# Patient Record
Sex: Male | Born: 1955 | Race: White | Hispanic: No | Marital: Married | Smoking: Former smoker
Health system: Southern US, Community
[De-identification: ages and names within clinical notes are randomized; demographics above are authoritative.]

## PROBLEM LIST (undated history)

## (undated) DIAGNOSIS — F319 Bipolar disorder, unspecified: Secondary | ICD-10-CM

## (undated) DIAGNOSIS — R0600 Dyspnea, unspecified: Secondary | ICD-10-CM

## (undated) DIAGNOSIS — M545 Low back pain, unspecified: Secondary | ICD-10-CM

## (undated) DIAGNOSIS — K5792 Diverticulitis of intestine, part unspecified, without perforation or abscess without bleeding: Secondary | ICD-10-CM

## (undated) DIAGNOSIS — I2699 Other pulmonary embolism without acute cor pulmonale: Secondary | ICD-10-CM

## (undated) DIAGNOSIS — E785 Hyperlipidemia, unspecified: Secondary | ICD-10-CM

## (undated) DIAGNOSIS — R609 Edema, unspecified: Secondary | ICD-10-CM

## (undated) DIAGNOSIS — N200 Calculus of kidney: Secondary | ICD-10-CM

## (undated) DIAGNOSIS — F329 Major depressive disorder, single episode, unspecified: Secondary | ICD-10-CM

## (undated) DIAGNOSIS — I442 Atrioventricular block, complete: Secondary | ICD-10-CM

## (undated) DIAGNOSIS — N3000 Acute cystitis without hematuria: Secondary | ICD-10-CM

## (undated) DIAGNOSIS — N400 Enlarged prostate without lower urinary tract symptoms: Secondary | ICD-10-CM

## (undated) DIAGNOSIS — F32A Depression, unspecified: Secondary | ICD-10-CM

## (undated) DIAGNOSIS — F419 Anxiety disorder, unspecified: Secondary | ICD-10-CM

## (undated) HISTORY — DX: Acute cystitis without hematuria: N30.00

## (undated) HISTORY — DX: Hyperlipidemia, unspecified: E78.5

## (undated) HISTORY — DX: Low back pain: M54.5

## (undated) HISTORY — DX: Dyspnea, unspecified: R06.00

## (undated) HISTORY — DX: Diverticulitis of intestine, part unspecified, without perforation or abscess without bleeding: K57.92

## (undated) HISTORY — DX: Low back pain, unspecified: M54.50

## (undated) HISTORY — DX: Edema, unspecified: R60.9

## (undated) HISTORY — PX: TONSILLECTOMY: SUR1361

## (undated) HISTORY — PX: KNEE ARTHROSCOPY: SUR90

## (undated) HISTORY — DX: Other pulmonary embolism without acute cor pulmonale: I26.99

## (undated) HISTORY — DX: Calculus of kidney: N20.0

## (undated) HISTORY — DX: Benign prostatic hyperplasia without lower urinary tract symptoms: N40.0

## (undated) HISTORY — DX: Atrioventricular block, complete: I44.2

## (undated) HISTORY — PX: OTHER SURGICAL HISTORY: SHX169

---

## 2002-09-02 HISTORY — PX: FINGER FRACTURE SURGERY: SHX638

## 2003-10-05 ENCOUNTER — Encounter: Admission: RE | Admit: 2003-10-05 | Discharge: 2003-10-05 | Payer: Self-pay | Admitting: Family Medicine

## 2003-10-05 ENCOUNTER — Inpatient Hospital Stay (HOSPITAL_COMMUNITY): Admission: EM | Admit: 2003-10-05 | Discharge: 2003-10-07 | Payer: Self-pay

## 2008-01-25 ENCOUNTER — Emergency Department (HOSPITAL_COMMUNITY): Admission: EM | Admit: 2008-01-25 | Discharge: 2008-01-25 | Payer: Self-pay | Admitting: Emergency Medicine

## 2009-10-03 DIAGNOSIS — I2699 Other pulmonary embolism without acute cor pulmonale: Secondary | ICD-10-CM

## 2009-10-03 HISTORY — DX: Other pulmonary embolism without acute cor pulmonale: I26.99

## 2009-10-22 ENCOUNTER — Encounter: Payer: Self-pay | Admitting: Emergency Medicine

## 2009-10-22 ENCOUNTER — Inpatient Hospital Stay (HOSPITAL_COMMUNITY): Admission: EM | Admit: 2009-10-22 | Discharge: 2009-10-24 | Payer: Self-pay | Admitting: Internal Medicine

## 2009-10-22 ENCOUNTER — Ambulatory Visit: Payer: Self-pay | Admitting: Interventional Radiology

## 2009-10-23 ENCOUNTER — Encounter (INDEPENDENT_AMBULATORY_CARE_PROVIDER_SITE_OTHER): Payer: Self-pay | Admitting: Internal Medicine

## 2009-10-31 ENCOUNTER — Encounter (INDEPENDENT_AMBULATORY_CARE_PROVIDER_SITE_OTHER): Payer: Self-pay | Admitting: Family Medicine

## 2009-10-31 ENCOUNTER — Ambulatory Visit: Payer: Self-pay | Admitting: Surgery

## 2009-10-31 ENCOUNTER — Ambulatory Visit: Admission: RE | Admit: 2009-10-31 | Discharge: 2009-10-31 | Payer: Self-pay | Admitting: Family Medicine

## 2009-11-01 DIAGNOSIS — Z87898 Personal history of other specified conditions: Secondary | ICD-10-CM | POA: Insufficient documentation

## 2009-11-01 DIAGNOSIS — Z86711 Personal history of pulmonary embolism: Secondary | ICD-10-CM | POA: Insufficient documentation

## 2009-11-01 DIAGNOSIS — E785 Hyperlipidemia, unspecified: Secondary | ICD-10-CM | POA: Insufficient documentation

## 2009-11-02 ENCOUNTER — Ambulatory Visit: Payer: Self-pay | Admitting: Internal Medicine

## 2009-11-02 DIAGNOSIS — R1031 Right lower quadrant pain: Secondary | ICD-10-CM | POA: Insufficient documentation

## 2009-11-09 ENCOUNTER — Emergency Department (HOSPITAL_BASED_OUTPATIENT_CLINIC_OR_DEPARTMENT_OTHER): Admission: EM | Admit: 2009-11-09 | Discharge: 2009-11-09 | Payer: Self-pay | Admitting: Emergency Medicine

## 2009-11-09 ENCOUNTER — Ambulatory Visit: Payer: Self-pay | Admitting: Diagnostic Radiology

## 2010-03-12 ENCOUNTER — Ambulatory Visit: Payer: Self-pay | Admitting: Internal Medicine

## 2010-03-12 DIAGNOSIS — R0609 Other forms of dyspnea: Secondary | ICD-10-CM | POA: Insufficient documentation

## 2010-03-12 DIAGNOSIS — R0989 Other specified symptoms and signs involving the circulatory and respiratory systems: Secondary | ICD-10-CM | POA: Insufficient documentation

## 2010-04-11 ENCOUNTER — Ambulatory Visit: Payer: Self-pay | Admitting: Pulmonary Disease

## 2010-06-08 ENCOUNTER — Emergency Department (HOSPITAL_COMMUNITY): Admission: EM | Admit: 2010-06-08 | Discharge: 2010-06-08 | Payer: Self-pay | Admitting: Emergency Medicine

## 2010-07-01 ENCOUNTER — Encounter: Payer: Self-pay | Admitting: Pulmonary Disease

## 2010-07-01 ENCOUNTER — Ambulatory Visit (HOSPITAL_BASED_OUTPATIENT_CLINIC_OR_DEPARTMENT_OTHER): Admission: RE | Admit: 2010-07-01 | Discharge: 2010-07-01 | Payer: Self-pay | Admitting: Pulmonary Disease

## 2010-07-12 ENCOUNTER — Ambulatory Visit: Payer: Self-pay | Admitting: Pulmonary Disease

## 2010-07-16 ENCOUNTER — Ambulatory Visit: Payer: Self-pay | Admitting: Pulmonary Disease

## 2010-08-20 ENCOUNTER — Emergency Department (HOSPITAL_BASED_OUTPATIENT_CLINIC_OR_DEPARTMENT_OTHER)
Admission: EM | Admit: 2010-08-20 | Discharge: 2010-08-20 | Payer: Self-pay | Source: Home / Self Care | Admitting: Emergency Medicine

## 2010-08-30 ENCOUNTER — Ambulatory Visit: Payer: Self-pay | Admitting: Cardiology

## 2010-09-10 ENCOUNTER — Ambulatory Visit
Admission: RE | Admit: 2010-09-10 | Discharge: 2010-09-10 | Payer: Self-pay | Source: Home / Self Care | Attending: Internal Medicine | Admitting: Internal Medicine

## 2010-09-10 DIAGNOSIS — R079 Chest pain, unspecified: Secondary | ICD-10-CM | POA: Insufficient documentation

## 2010-09-19 ENCOUNTER — Ambulatory Visit (HOSPITAL_COMMUNITY): Admission: RE | Admit: 2010-09-19 | Payer: Self-pay | Source: Home / Self Care | Admitting: Cardiology

## 2010-09-19 ENCOUNTER — Telehealth (INDEPENDENT_AMBULATORY_CARE_PROVIDER_SITE_OTHER): Payer: Self-pay | Admitting: *Deleted

## 2010-09-20 ENCOUNTER — Encounter (HOSPITAL_COMMUNITY)
Admission: RE | Admit: 2010-09-20 | Discharge: 2010-10-02 | Payer: Self-pay | Source: Home / Self Care | Attending: Cardiology | Admitting: Cardiology

## 2010-09-20 ENCOUNTER — Encounter: Payer: Self-pay | Admitting: Cardiology

## 2010-09-20 ENCOUNTER — Ambulatory Visit (HOSPITAL_COMMUNITY)
Admission: RE | Admit: 2010-09-20 | Discharge: 2010-09-20 | Payer: Self-pay | Source: Home / Self Care | Attending: Cardiology | Admitting: Cardiology

## 2010-09-20 ENCOUNTER — Ambulatory Visit: Admission: RE | Admit: 2010-09-20 | Discharge: 2010-09-20 | Payer: Self-pay | Source: Home / Self Care

## 2010-09-20 ENCOUNTER — Encounter: Payer: Self-pay | Admitting: Internal Medicine

## 2010-10-02 NOTE — Assessment & Plan Note (Signed)
Summary: PE/ HOSPITAL PT/ MBW   Visit Type:  Initial Consult Copy to:  [facility] Primary Provider/Referring Provider:  Dr. Benedetto Goad, [facility]  CC:  HFU. Pt states SOB has improved as well as chest tightness. [name]  History of Present Illness: IOV 11/01/2009: 54 year old obese male (BMI 39), ex-smoker with a history of  hyperlipidemia, BPH, regular jogger and previous marathon runner. DEveloped PE 10/22/2009 (see details in PMhx)  Was randomized to COUMADIN on the open labelled EINSTEIN trial. Completed lovvenox bridge on 10/30/2009. Coumadin monitoring by PMD.   Since discharge on 10/24/2009 was dong well. But on 10/30/2009 developed sudden onset pain in RLQ that radiated to left and back. Crampy feeling. Scale 7 out of 10 in severity. Could not sleep that night. Vicodin on morning of 10/31/2009 releived pain. No further pain since 11/01/2009 yesterday. Feels back to normal now. He feels pain felt muscular. HE notes that he was giving daily lovenox injections that he was giving only/mostly  in the RLQ Pain (over 10 of 14 given to RLQ). Pain was not associated with diarrhea, nausea, vomitting or prior diverticular disease type symptms, dizziness or new bruises (has some bruises from lovenox that is improving)  In terms of dyspnea, this is singificantly better by 60-70% better and is now able to do more. Dyspnea relieved with rest Sone sinus congestion + with outbreak of spring since discharge from hospital. Aspcoiatd transient mild headache present.  Also on 2/28 some rt infraclaviculat sensation of coolness on 2/27 for 30 sec that resolved.     Preventive Screening-Counseling & Management  Alcohol-Tobacco     Smoking Status: quit  Current Medications (verified): 1)  Warfarin Sodium 7.5 Mg Tabs (Warfarin Sodium) .... As Directed 2)  Flomax 0.4 Mg Caps (Tamsulosin Hcl) .... Once Daily 3)  Pravachol 40 Mg Tabs (Pravastatin Sodium) .... Once Daily 4)  Trazodone Hcl 50 Mg Tabs  (Trazodone Hcl) .... At Bedtime 5)  Hydrocodone-Acetaminophen 5-500 Mg Tabs (Hydrocodone-Acetaminophen) .... As Needed Pain  Allergies (verified): 1)  ! 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   runs  [employer], ran 1/2 marathon on march 2010.  works out at [employer] in [place]  Risk Factors: Smoking Status: quit (12/01/2009)  Past Medical History: BENIGN PROSTATIC HYPERTROPHY, HX OF (ICD-V13.8) PULMONARY EMBOLISM (ICD-415.19) HYPERLIPIDEMIA (ICD-272.4) Diverticulitis  Past Surgical History: right knee-1990 left ring finger-2007 kidney stone removal-1990 diverticulitis-torn bowel repair-2005  Past Pulmonary History:  Pulmonary History: 10/22/2009:  Hospitalized for (Pulmonary Embolus)  bilateral extensive pulmonary embolism on CT chest 10/22/2009. REview of recoreds shows LOW PESI SCORE SEVERITY INDEX (likely normoxic even at onset, normal BP, normal resp rate and normal heart rate, normal ECHO, normal bnp, normal sodium, and normal troponin x 3). Etiologic workup for gentic profile normal. Discharged on 10/24/2009. Was randomized to COUMADIN on the open labelled EINSTEIN trial. Completed lovvenox bridge on 10/30/2009. Coumadin monitoring by PMD.  Etiology unclear. Not sedantary and Non-smoker, Did go to [place] in mid-Jan 2011 by air. Maternal grandafther died of Pulmonary Embolism.  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   runs  [employer], ran 1/2 marathon on march 2010.  works out at [employer] in [place] Status:  quit  Review of Systems       The  patient complains of shortness of breath with activity and abdominal pain.  The patient denies shortness of breath at rest, productive cough, non-productive cough, coughing up blood, chest pain, irregular heartbeats, acid heartburn, indigestion, loss of appetite, weight change, difficulty swallowing, sore throat, tooth/dental problems, headaches, nasal congestion/difficulty breathing through nose, sneezing, itching, ear ache, anxiety, depression, hand/feet swelling, joint stiffness or pain, rash, change in color of mucus, and fever.    Vital Signs:  Patient profile:   55 year old male Height:      68 inches Weight:      222.13 pounds BMI:     33.90 O2 Sat:      95 % on Room air Pulse rate:   71 / minute BP sitting:   130 / 82  (right arm) Cuff size:   large  Vitals Entered By: Carron Curie CMA (November 02, 2009 11:00 AM)  O2 Flow:  Room air CC: HFU. Pt states SOB has improved as well as chest tightness.  Comments Medications reviewed with patient Carron Curie CMA  November 02, 2009 11:01 AM Daytime phone number verified with patient.    Physical Exam  General:  obese.   Head:  normocephalic and atraumatic Eyes:  PERRLA/EOM intact; conjunctiva and sclera clear Ears:  TMs intact and clear with normal canals Nose:  no deformity, discharge, inflammation, or lesions Mouth:  Melampatti Class IV.   Neck:  no masses, thyromegaly, or abnormal cervical nodes Chest Wall:  no deformities noted Lungs:  clear bilaterally to auscultation and percussion Heart:  regular rate and rhythm, S1, S2 without murmurs, rubs, gallops, or clicks Abdomen:  bowel sounds positive; abdomen soft and non-tender without masses, or organomegaly  RLQ bruised from lovenox at skin level - yellow discoloration and improving Msk:  no deformity or scoliosis noted with normal posture Pulses:  pulses normal Extremities:  no clubbing, cyanosis, edema, or deformity noted Neurologic:  CN II-XII grossly intact with  normal reflexes, coordination, muscle strength and tone Skin:  intact without lesions or rashes Cervical Nodes:  no significant adenopathy Axillary Nodes:  no significant adenopathy Psych:  alert and cooperative; normal mood and affect; normal attention span and concentration   MISC. Report  Procedure date:  10/22/2009  Findings:       EKG shows normal sinus rhythm with some T wave   changes in V1 and V3.  CBC, WBC 9.4, hemoglobin 15.7, hematocrit 47.1,   platelets 241,000.  PT/INR 12.3 and 0.9.  D-dimer 2.9.  Basic metabolic   panel sodium 143, potassium 4.4, 106, carbon dioxide 29, glucose 95, BUN   14, creatinine 1, calcium 9.2, BNP 13.3, CK-MB 2.7, troponin I 0.1.  CT   angio chest shows extensive bilateral pulmonary emboli.      Comments:      all independently reviewed incluiding CT chest  MISC. Report  Procedure date:  10/22/2009  Findings:      nits      Perf. Loc.  PTTLA                                    42.5  secs    Reference range: 32.0 to 43.4  PTTLA Confirmation                       NOT APPL                           secs    Reference range: <8.0  PTTLA 4:1 Mix                            NOT APPL                           secs    Reference range: 36.3 to 48.8  dRVVT                                    40.3                               secs    *** Please note change in reference range(s). ***    Reference range: 36.2 to 44.3  dRVVT Confirmation                       NOT APPL                           Ratio    *** Please note change in reference range(s). ***    Reference range: <1.21  dRVVT 1:1 Mix                            NOT APPL                           secs    *** Please note change in reference range(s). ***    Reference range: 36.2 to 44.3  Lupus Anticoagulant                      SEE NOTE.    NOT DETECTED  Comments:      Lupus anticoagulant  MISC. Report  Procedure date:  10/22/2009  Findings:      protein S,  Factor 5, protein C, Antithrombin 3 - all negative/normal. Looks like it was done before anticoagulation start  MISC. Report  Procedure date:  10/23/2009  Findings:      echo: Normal LV and RV and PA pressures  CT of Chest  Procedure date:  10/22/2009  Findings:      Findings:  There is   a moderate load of lobar and segmental   pulmonary emboli bilaterally. There is early contrast opacification   of the thoracic aorta, with no evidence of dissection, aneurysm, or   stenosis.  No pleural or pericardial effusion.  Normal sized   mediastinal lymph nodes.  No hilar adenopathy.  Lungs are clear.   Thoracic spine intact.    Review of the MIP images confirms the above findings.    IMPRESSION:    1.  Extensive bilateral pulmonary emboli.  Comments:      iindependently rviewed  MISC. Report  Procedure date:  10/22/2009  Findings:  protein C, protein S, AAPL, Factor 5 - all negative  Impression & Recommendations:  Problem # 1:  PULMONARY EMBOLISM (ICD-415.19) Assessment New  Low severity PE on 10/22/2009 of unknonw etiology but only positive feature is Granparent suffered PE. For now recommended coumadin minimum 6 months and at end of that will refer to heme to finish gentic workup. STil lgiven positive familyhx would recommend total 1-4 year treatment if he can handle it. Risk of recurrence is present and if recurs needs life long anticoagulation. I am unsure if I can recommend life long anticoagulation currently. Heme opinion would be beneficial. He understands the risks and benefits. HAve advised slow process in getting back to fitness again His updated medication list for this problem includes:    Warfarin Sodium 7.5 Mg Tabs (Warfarin sodium) .[name]... As directed  Orders: Consultation Level IV (16109)  Problem # 2:  RLQ PAIN (ICD-789.03) Assessment: New  due to lovenoz shots all given at one spot. Improving now  plan reassure and monitor  Orders: Consultation  Level IV (60454)  Medications Added to Medication List This Visit: 1)  Hydrocodone-acetaminophen 5-500 Mg Tabs (Hydrocodone-acetaminophen) .... As needed pain  Patient Instructions: 1)  The abdominal pain is from lovenox shots - keep an eye on it 2)  Get pulse oximeter to monitor your oxygen leves and Heart rate 3)  keep your oxygen levels >88-92% 4)  slowly return to exercise using heart rate and oxygen meter 5)  return to see me in 5 months 6)  I forogot to mention you might have sleep apnea 7)  We can address sleep issue at nxt visit or I can set you with a sleep referral if you want. Please let my assistant know 8)  Continue coumadin diligently   Immunization History:  Influenza Immunization History:    Influenza:  fluvax 3+ (10/21/2009)  Pneumovax Immunization History:    Pneumovax:  pneumovax (10/21/2009)

## 2010-10-02 NOTE — Assessment & Plan Note (Signed)
Summary: consult for possible osa   Copy to:  Kalman Shan Primary Provider/Referring Provider:  Benedetto Goad  CC:  Sleep Consult.  History of Present Illness: the pt is a 55y/o male who comes in today for evaluation of possible osa.  He has been noted to have loud snoring, as well as pauses in his breathing during sleep.  He goes to bed btw 10-11pm, and gets up at 6am to start his day.  He awakens frequently at 4am for some reason.  He is not rested in the am's upon arising.  He notes only occasional sleep pressure at work while inactive, but does not feel it is an issue.  He is satisfied with his alertness during the day.  He will doze in the evening on occasion with tv and movies, and can sometimes get sleepy driving longer distances.  His epworth score today is 9, and his weight is up 20 pounds over the past 2 years.  Medications Prior to Update: 1)  Warfarin Sodium 7.5 Mg Tabs (Warfarin Sodium) .... As Directed 2)  Flomax 0.4 Mg Caps (Tamsulosin Hcl) .... Once Daily 3)  Pravachol 40 Mg Tabs (Pravastatin Sodium) .... Once Daily 4)  Trazodone Hcl 50 Mg Tabs (Trazodone Hcl) .... At Bedtime 5)  Advil 200 Mg Tabs (Ibuprofen) .... As Needed For Pain  Allergies (verified): 1)  ! Sulfa  Past History:  Past Medical History: BENIGN PROSTATIC HYPERTROPHY, HX OF (ICD-V13.8) PULMONARY EMBOLISM (ICD-415.19) HYPERLIPIDEMIA (ICD-272.4) Diverticulitis nephrolithiasis  Past Surgical History: Reviewed history from 11/02/2009 and no changes required. right knee-1990 left ring finger-2007 kidney stone removal-1990 diverticulitis-torn bowel repair-2005  Past Pulmonary History:  Pulmonary History: 10/22/2009:  Hospitalized for (Pulmonary Embolus)  bilateral extensive pulmonary embolism on CT chest 10/22/2009. REview of recoreds shows LOW PESI SCORE SEVERITY INDEX (likely normoxic even at onset, normal BP, normal resp rate and normal heart rate, normal ECHO, normal bnp, normal sodium, and  normal troponin x 3). Etiologic workup for gentic profile normal. Discharged on 10/24/2009. Was randomized to COUMADIN on the open labelled EINSTEIN trial. Completed lovvenox bridge on 10/30/2009. Coumadin monitoring by PMD.  Etiology unclear. Not sedantary and Non-smoker, Did go to [place] in mid-Jan 2011 by air. Maternal grandafther died of Pulmonary Embolism.  Family History: Reviewed history from 11/02/2009 and no changes required. MGF-died of PE age 40's PGF-stroke-age 30 Father-MI age 24, still living  Social History: Reviewed history from 11/02/2009 and no changes required. 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 Started in 1971, quit in 1989.  1 ppd.    runs  [employer], ran 1/2 marathon on march 2010.  works out at [employer] in [place]  Review of Systems       The patient complains of shortness of breath with activity, indigestion, weight change, and abdominal pain.  The patient denies shortness of breath at rest, productive cough, non-productive cough, coughing up blood, chest pain, irregular heartbeats, acid heartburn, loss of appetite, difficulty swallowing, sore throat, tooth/dental problems, headaches, nasal congestion/difficulty breathing through nose, sneezing, itching, ear ache, anxiety, depression, hand/feet swelling, joint stiffness or pain, rash, change in color of mucus, and fever.    Vital Signs:  Patient profile:   55 year old male Height:      68 inches Weight:      230.25 pounds BMI:     35.14 O2 Sat:      94 % on Room air Temp:     97.8 degrees  F oral Pulse rate:   73 / minute BP sitting:   110 / 66  (left arm) Cuff size:   large  Vitals Entered By: Arman Filter LPN (April 11, 2010 3:36 PM)  O2 Flow:  Room air CC: Sleep Consult Comments Medications reviewed with patient Arman Filter LPN  April 11, 2010 3:36 PM    Physical Exam  General:  ow male in nad Eyes:  PERRLA and EOMI.   Nose:   narrowed bilat, especially on left Mouth:  very small oropharynx, elongation of soft palate, difficult to see uvula Neck:  no jvd, tmg, LN Lungs:  faint basilar crackles. Heart:  rrr, no mrg Abdomen:  soft and nontender, bs+ Extremities:  1+ edema noted bilat, no cyanosis  pulses intact but decreased bilat. Neurologic:  alert and oriented, moves all 4.   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  He is overweight, has abnormal upper airway anatomy, nonrestorative sleep, and daytime sleepiness when his degree of stimulation falls below a significant level.  I have had a long discussion with the pt about sleep apnea, including its impact on QOL and CV health.  I think there is enough concern about osa to proceed with a sleep study, and the pt is agreeable.  Other Orders: Consultation Level IV (16109) Sleep Disorder Referral (Sleep Disorder)  Patient Instructions: 1)  will set up for sleep study, and call to schedule followup once results are available. 2)  work on weight loss

## 2010-10-02 NOTE — Assessment & Plan Note (Signed)
Summary: f/u ///kp   Visit Type:  Follow-Winters Copy to:  [facility] Primary Provider/Referring Provider:  Dr. Benedetto Goad, [facility]  CC:  Sean Winters. Sean states breathing is the same. Sean states no complaints.  History of Present Illness:  55 year old obese male (BMI 18 in March, 35 in July 2011), ex-smoker with a history of  hyperlipidemia, BPH, regular jogger and previous marathon runner. 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  Coumadin monitoring by PMD.   OV 03/12/2010: Followup PE.  Since last visit in Mrach 2011 dyspnea has continued to improve. At last visit he was only 65% of baseline. Now he is at 85-90% of baseline. Has gained more weight but has joined gym and working on treadmill. Able to do 30 minutes on treadmill wihtout dyspnea. COmpliant with coumadin. INR always theraeputic on stable dose of coumadin. There are no other complaints.  Did sleep apnea screen - Epworth Sleep Score is 9.     Preventive Screening-Counseling & Management  Alcohol-Tobacco     Smoking Status: quit     Packs/Day: 1.0     Year Started: 1971     Year Quit: 1989  Current Medications (verified): 1)  Warfarin Sodium 7.5 Mg Tabs (Warfarin Sodium) .... As Directed 2)  Flomax 0.4 Mg Caps (Tamsulosin Hcl) .... Once Daily 3)  Pravachol 40 Mg Tabs (Pravastatin Sodium) .... Once Daily 4)  Trazodone Hcl 50 Mg Tabs (Trazodone Hcl) .... At Bedtime 5)  Advil 200 Mg Tabs (Ibuprofen) .... As Needed For Pain  Allergies (verified): 1)  ! 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   runs  [employer], ran 1/2 marathon on march 2010.  works out at [employer]  in [place]  Risk Factors: Smoking Status: quit (03/12/2010) Packs/Day: 1.0 (03/12/2010)  Past Medical History: Reviewed history from 11/10/09 and no changes required. BENIGN PROSTATIC HYPERTROPHY, HX OF (ICD-V13.8) PULMONARY EMBOLISM (ICD-415.19) HYPERLIPIDEMIA (ICD-272.4) Diverticulitis  Past Surgical History: Reviewed history from November 10, 2009 and no changes required. right knee-1990 left ring finger-2007 kidney stone removal-1990 diverticulitis-torn bowel repair-2005  Past Pulmonary History:  Pulmonary History: 10/22/2009:  Hospitalized for (Pulmonary Embolus)  bilateral extensive pulmonary embolism on CT chest 10/22/2009. REview of recoreds shows LOW PESI SCORE SEVERITY INDEX (likely normoxic even at onset, normal BP, normal resp rate and normal heart rate, normal ECHO, normal bnp, normal sodium, and normal troponin x 3). Etiologic workup for gentic profile normal. Discharged on 10/24/2009. Was randomized to COUMADIN on the open labelled EINSTEIN trial. Completed lovvenox bridge on 10/30/2009. Coumadin monitoring by PMD.  Etiology unclear. Not sedantary and Non-smoker, Did go to [place] in mid-Jan 2011 by air. Maternal grandafther died of Pulmonary Embolism.  Family History: Reviewed history from November 10, 2009 and no changes required. MGF-died of PE age 25's PGF-stroke-age 45 Father-MI age 74, still living  Social History: Reviewed history from 11/10/2009 and no changes required. 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   runs  [place], ran 1/2 marathon on march 2010.  [place] [employer] [place]Day:  1.0  Review of Systems       The patient complains of shortness of breath with activity and hand/feet  swelling.  The patient denies shortness of breath at rest, productive cough, non-productive cough, coughing Winters blood, chest pain, irregular heartbeats, acid heartburn, indigestion, loss of appetite,  weight change, abdominal pain, difficulty swallowing, sore throat, tooth/dental problems, headaches, nasal congestion/difficulty breathing through nose, sneezing, itching, ear ache, anxiety, depression, joint stiffness or pain, rash, change in color of mucus, and fever.    Vital Signs:  Patient profile:   55 year old male Height:      68 inches Weight:      230.13 pounds BMI:     35.12 O2 Sat:      93 % on Room air Pulse rate:   77 / minute BP sitting:   118 / 68  (right arm) Cuff size:   large  Vitals Entered By: Zackery Barefoot CMA (March 12, 2010 4:29 PM)  O2 Flow:  Room air CC: Sean Winters. Sean states breathing is the same. Sean states no complaints Comments Medications reviewed with patient Verified contact number and pharmacy with patient Zackery Barefoot CMA  March 12, 2010 4:30 PM    Physical Exam  General:  obese.   Head:  normocephalic and atraumatic Eyes:  PERRLA/EOM intact; conjunctiva and sclera clear Ears:  TMs intact and clear with normal canals Nose:  no deformity, discharge, inflammation, or lesions Mouth:  Melampatti Class IV.   Neck:  no masses, thyromegaly, or abnormal cervical nodes Chest Wall:  no deformities noted Lungs:  clear bilaterally to auscultation and percussion Heart:  regular rate and rhythm, S1, S2 without murmurs, rubs, gallops, or clicks Abdomen:  bowel sounds positive; abdomen soft and non-tender without masses, or organomegaly  RLQ bruised from lovenox at skin level - yellow discoloration and improving Msk:  no deformity or scoliosis noted with normal posture Pulses:  pulses normal Extremities:  no clubbing, cyanosis, edema, or deformity noted Neurologic:  CN II-XII grossly intact with normal reflexes, coordination, muscle strength and tone Skin:  intact without lesions or rashes Cervical Nodes:  no significant adenopathy Axillary Nodes:  no significant adenopathy Psych:  alert and cooperative; normal mood and affect; normal  attention span and concentration   Impression & Recommendations:  Problem # 1:  SNORING (ICD-786.09) Assessment New Epwroth score is 9. Mallampatti Upper Airway Class is 4. Snores. BMi i s35.  We discussed. I gave him option of continuing exercise program and lose weight but he wants to see sleep specialist. Have refered him to Dr. Shelle Iron Orders: Sleep Disorder Referral (Sleep Disorder) Est. Patient Level III (09811)  Problem # 2:  PULMONARY EMBOLISM (ICD-415.19) Assessment: Unchanged  His updated medication list for this problem includes:    Warfarin Sodium 7.5 Mg Tabs (Warfarin sodium) .[name]... As directed  Low severity PE on 10/22/2009 of unknonw etiology but only positive feature is Granparent suffered PE. No bleeding issues so far.  Hypercoagulable work Winters negative. He is now s/p 4 months coumadin Rx. We discussed porential duration. WEighed risks and benefits. Given distant family history and his being idiopathic I have recommended to finish 1 year coumadin treatment and perhaps for 2nd year will continue coumadin for INR goal > 1.5 for total 2 year treatment. this is becuaes risk for recurrence is greatest for 1st 1 years. He is willing for this plan. Will review again in 6 months.  Hold of heme consult  Monitor dyspnea improvement with continued gym exercises  Orders: Est. Patient Level III ([id number])  Problem # 3:  RLQ PAIN (ICD-789.03) Assessment: Improved resolved  Medications Added to Medication List This Visit: 1)  Advil 200 Mg Tabs (Ibuprofen) .... As needed for pain  Patient Instructions: 1)  We should do 1 year full coumadin treatment 2)  Focus on working out in gym and keeping weight in check 3)  I will refer you to Dr. Shelle Iron sleep specialist  4)  return in 6-8 momths for folllowup

## 2010-10-02 NOTE — Assessment & Plan Note (Signed)
Summary: rov to review sleep study results.   Copy to:  Kalman Shan Primary Provider/Referring Provider:  Benedetto Goad  CC:  pt here to discuss sleep study results. .  History of Present Illness: the pt comes in today for f/u of his recent npsg.  He was found to have no clinically significant sleep apnea, but did have moderate to loud snoring.  He had no significant desats, no arrhythmias, but did have moderate numbers of leg jerks with mild sleep disruption.  He denies having RLS symptoms in the early evening, and his wife has not c/o kicking.  Current Medications (verified): 1)  Warfarin Sodium 7.5 Mg Tabs (Warfarin Sodium) .... As Directed 2)  Flomax 0.4 Mg Caps (Tamsulosin Hcl) .... Once Daily 3)  Pravachol 40 Mg Tabs (Pravastatin Sodium) .... Once Daily 4)  Trazodone Hcl 50 Mg Tabs (Trazodone Hcl) .... At Bedtime As Needed 5)  Advil 200 Mg Tabs (Ibuprofen) .... As Needed For Pain  Allergies (verified): 1)  ! Sulfa  Review of Systems       The patient complains of nasal congestion/difficulty breathing through nose.  The patient denies shortness of breath with activity, shortness of breath at rest, productive cough, non-productive cough, coughing up blood, chest pain, irregular heartbeats, acid heartburn, indigestion, loss of appetite, weight change, abdominal pain, difficulty swallowing, sore throat, tooth/dental problems, headaches, sneezing, itching, ear ache, anxiety, depression, hand/feet swelling, joint stiffness or pain, rash, change in color of mucus, and fever.    Vital Signs:  Patient profile:   55 year old male Height:      68 inches Weight:      226.38 pounds BMI:     34.55 O2 Sat:      96 % on Room air Temp:     98.2 degrees F oral Pulse rate:   72 / minute BP sitting:   122 / 70  (left arm) Cuff size:   large  Vitals Entered By: Carver Fila (July 16, 2010 4:14 PM)  O2 Flow:  Room air CC: pt here to discuss sleep study results.  Comments meds and  allergies updated Phone number updated Carver Fila  July 16, 2010 4:15 PM    Physical Exam  General:  ow male in nad Nose:  no nasal discharge or drainage noted. Extremities:  no edema or cyanosis Neurologic:  alert, does not appear sleepy, moves all 4.   Impression & Recommendations:  Problem # 1:  SNORING (ICD-786.09)  the pt did not have sleep apnea by his npsg, but did have loud snoring.  He is not overly symptomatic, but does have some nonrestorative sleep and daytime sleep pressure with inactivity.  He denies having any leg kicks or RLS symptoms, despite having plms on his sleep study.  At this point, I have encouraged him to work hard on weight loss and staying off his back while sleeping as much as possible.  I have instructed him to ask his wife about leg kicks during the night, and to call us if she feels this is a possible issue.  I have also asked him to consider ENT eval if his nasal airway is bothering him during the day, and this may be part of his snoring issue as well.    Medications Added to Medication List This Visit: 1)  Trazodone Hcl 50 Mg Tabs (Trazodone hcl) .... At bedtime as needed  Other Orders: Est. Patient Level III (16109)  Patient Instructions: 1)  work on weight loss and  positional therapy 2)  discuss with wife whether you are kicking during the night, and let me know if you are 3)  if your nasal airway is bothering you, can consider nasal surgery to help with this and your snoring. 4)  followup with me as needed.

## 2010-10-04 NOTE — Assessment & Plan Note (Signed)
Summary: f/u 6-8 months///kp   Visit Type:  Follow-up Copy to:  Kalman Shan Primary Provider/Referring Provider:  Benedetto Goad, Dr. Sw[place], cardiology  CC:  Pt here for follow-up. pt c/o SOB and chest pains.  Pt is set to have and stress test and echo on 09-20-10. 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 Randomized to coumadin on Open label EINSTEIN study. He has been doing well. PMD monitoring coumadin. States he is tolerating coumadin well. Most of the time INR >2. Occ 1-2 times INR dropped to 1.8 but no lower. On 08/20/2010 developed acute chest pain following 4 mile jog. ER records reviewed. CT angiogram negative for PE. Now seeing Dr. Swaziland. Describes classic angina symptoms iwht radiation to arm. Does not have chest pain with 3 mile walk but can have it with stress. No hemoptysis. No edema. Is now finished 11 months coumadin Rx.    Preventive Screening-Counseling & Management  Alcohol-Tobacco     Smoking Status: quit     Packs/Day: 1.0     Year Started: 1971     Year Quit: 1989  Current Medications (verified): 1)  Warfarin Sodium 7.5 Mg Tabs (Warfarin Sodium) .... As Directed 2)  Flomax 0.4 Mg Caps (Tamsulosin Hcl) .... Once Daily 3)  Pravachol 40 Mg Tabs (Pravastatin Sodium) .... Once Daily 4)  Trazodone Hcl 50 Mg Tabs (Trazodone Hcl) .... At Bedtime As Needed 5)  Advil 200 Mg Tabs (Ibuprofen) .... As Needed For Pain 6)  Aspir-Low 81 Mg Tbec (Aspirin) .... Take 1 Tablet By Mouth Once A Day  Allergies (verified): 1)  ! Sulfa  Past History:  Past medical, surgical, family and social histories (including risk factors) reviewed, and no changes noted (except as noted below).  Past Medical History: Reviewed history from 04/11/2010 and no changes required. BENIGN PROSTATIC HYPERTROPHY, HX OF (ICD-V13.8) PULMONARY EMBOLISM (ICD-415.19) HYPERLIPIDEMIA (ICD-272.4) Diverticulitis nephrolithiasis  Past Surgical  History: Reviewed history from 11/02/2009 and no changes required. right knee-1990 left ring finger-2007 kidney stone removal-1990 diverticulitis-torn bowel repair-2005  Past Pulmonary History:  Pulmonary History: 10/22/2009:  Hospitalized for (Pulmonary Embolus)  bilateral extensive pulmonary embolism on CT chest 10/22/2009. REview of recoreds shows LOW PESI SCORE SEVERITY INDEX (likely normoxic even at onset, normal BP, normal resp rate and normal heart rate, normal ECHO, normal bnp, normal sodium, and normal troponin x 3). Etiologic workup for gentic profile normal. Discharged on 10/24/2009. Was randomized to COUMADIN on the open labelled EINSTEIN trial. Completed lovvenox bridge on 10/30/2009. Coumadin monitoring by PMD.  Etiology unclear. Not sedantary and Non-smoker, Did go to [place] in mid-Jan 2011 by air. Maternal grandafther died of Pulmonary Embolism.  Family History: Reviewed history from 04/11/2010 and no changes required. MGF-died of PE age 36's PGF-stroke-age 30 Father-MI age 48, still living  Social History: Reviewed history from 04/11/2010 and no changes required. 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 Started in 1971, quit in 1989.  1 ppd.    runs  [employer], ran 1/2 marathon on march 2010.  works out at [employer] in [place]  Review of Systems  The patient denies shortness of breath with activity, shortness of breath at rest, productive cough, non-productive cough, coughing up blood, chest pain, irregular heartbeats, acid heartburn, indigestion, loss of appetite, weight change, abdominal pain, difficulty swallowing, sore throat, tooth/dental problems, headaches, nasal congestion/difficulty breathing through nose, sneezing, itching, ear ache, anxiety, depression,  hand/feet swelling, joint stiffness or pain, rash, change in color of mucus, and fever.    Vital Signs:  Patient profile:   55 year old male Height:       68 inches Weight:      227.50 pounds BMI:     34.72 O2 Sat:      93 % on Room air Temp:     97.6 degrees F oral Pulse rate:   76 / minute BP sitting:   120 / 80  (right arm) Cuff size:   regular  Vitals Entered By: Carron Curie CMA (September 10, 2010 3:35 PM)  O2 Flow:  Room air CC: Pt here for follow-up. pt c/o SOB and chest pains.  Pt is set to have and stress test and echo on 09-20-10.  Comments Medications reviewed with patient Carron Curie CMA  September 10, 2010 3:40 PM Daytime phone number verified with patient.    Physical Exam  General:  ow male in nad Head:  normocephalic and atraumatic Eyes:  PERRLA and EOMI.   Ears:  TMs intact and clear with normal canals Nose:  no nasal discharge or drainage noted. Mouth:  very small oropharynx, elongation of soft palate, difficult to see uvula Neck:  no jvd, tmg, LN Chest Wall:  no deformities noted Lungs:  faint basilar crackles. Heart:  rrr, no mrg Abdomen:  soft and nontender, bs+ Msk:  no deformity or scoliosis noted with normal posture Pulses:  pulses normal Extremities:  no edema or cyanosis Neurologic:  alert, does not appear sleepy, moves all 4. Skin:  intact without lesions or rashes Cervical Nodes:  no significant adenopathy Axillary Nodes:  no significant adenopathy Psych:  alert and cooperative; normal mood and affect; normal attention span and concentration   MISC. Report  Procedure date:  08/21/2010  Findings:      I-STAT-Comment                           SEE NOTE.    Oversized comment, see footnote  1  CKMB, POC                                <1.0       l      1.0-8.0          ng/mL  Troponin I, POC                          <0.05             0.00-0.09        ng/mL  Myoglobin, POC                           34.9              12-200           ng/mL  MISC. Report  Procedure date:  08/20/2010  Findings:      ct chest - negative for PE. Personally reviewed  Impression &  Recommendations:  Problem # 1:  PULMONARY EMBOLISM (ICD-415.19) Assessment Improved  Low severity PE on 10/22/2009 of unknonw etiology but only positive feature is Granparent suffered PE.  Repeat CT chest dec 2011 shows no evidence of PE which is reassuring. No bleeding issues so far.  Hypercoagulable work up  negative. He is now s/p 11 months coumadin Rx. We discussed porential duration. WEighed risks and benefits. Given distant family history and his being idiopathic I have recommended to finish 1 year coumadin treatment at a minimum. Then for 2nd year (Starting November 01, 2010) we resolved to continue coumadin but at INR goal > 1.6 to 2 to cut down risk for recurrence. Have advised weight loss.   Orders: Est. Patient Level III (30865)  Orders: Est. Patient Level III (78469)  Medications Added to Medication List This Visit: 1)  Aspir-low 81 Mg Tbec (Aspirin) .... Take 1 tablet by mouth once a day  Patient Instructions: 1)  sartting November 01, 2010 your INR goal on coumadin is 1.6 - 2 2)  return to see me in 9 months or sooner ifthere are problems 3)  focus on weight management and loss and lifestyle modification 4)  good luck with chest pain evaluation with Dr. Swaziland 5)  come sooner if there are problems

## 2010-10-04 NOTE — Assessment & Plan Note (Signed)
Summary: Cardiology Nuclear Testing  Nuclear Med Background Indications for Stress Test: Evaluation for Ischemia   History: Echo  History Comments: Echo: EF= 55-60%  Symptoms: Chest Pain, Chest Pain with Exertion, Chest Pressure, Dizziness, Fatigue, SOB    Nuclear Pre-Procedure Cardiac Risk Factors: Family History - CAD, History of Smoking, Lipids Caffeine/Decaff Intake: none NPO After: 8:00 PM Lungs: clear IV 0.9% NS with Angio Cath: 18g     IV Site: R Antecubital IV Started by: Stanton Kidney, EMT-P Chest Size (in) 44     Height (in): 68 Weight (lb): 224 BMI: 34.18  Nuclear Med Study 1 or 2 day study:  1 day     Stress Test Type:  Stress Reading MD:  Dietrich Pates, MD     Referring MD:  P.Jordan Resting Radionuclide:  Technetium 39m Tetrofosmin     Resting Radionuclide Dose:  11.0 mCi  Stress Radionuclide:  Technetium 53m Tetrofosmin     Stress Radionuclide Dose:  33.0 mCi   Stress Protocol Exercise Time (min):  7:15 min     Max HR:  150 bpm     Predicted Max HR:  164 bpm  Max Systolic BP: 186 mm Hg     Percent Max HR:  91.46 %     METS: 8.2 Rate Pressure Product:  04540    Stress Test Technologist:  Milana Na, EMT-P     Nuclear Technologist:  Domenic Polite, CNMT  Rest Procedure  Myocardial perfusion imaging was performed at rest 45 minutes following the intravenous administration of Technetium 70m Tetrofosmin.  Stress Procedure  The patient exercised for 7:15 The patient stopped due to fatigue, sob,and denied any chest pain.  There were no significant ST-T wave changes.  Technetium 20m Tetrofosmin was injected at peak exercise and myocardial perfusion imaging was performed after a brief delay.  QPS Raw Data Images:  Rest images were motion corrected.  Soft tissue (diaphragm) underlies heart. Stress Images:  Normal homogeneous uptake in all areas of the myocardium. Rest Images:  Mild thinning in the inferior base. Subtraction (SDS):  No evidence of  ischemia. Transient Ischemic Dilatation:  1.06  (Normal <1.22)  Lung/Heart Ratio:  0.31  (Normal <0.45)  Quantitative Gated Spect Images QGS EDV:  130 ml QGS ESV:  52 ml QGS EF:  60 %   Overall Impression  Exercise Capacity: Good exercise capacity. BP Response: Normal blood pressure response. Clinical Symptoms: Chest tightness ECG Impression: No significant ST segment change suggestive of ischemia. Overall Impression: Normal stress nuclear study.  Appended Document: Cardiology Nuclear Testing COPY SENT TO DR. Swaziland

## 2010-10-04 NOTE — Progress Notes (Signed)
Summary: Nuclear Pre-Procedure  Phone Note Outgoing Call Call back at [facility] Phone [phone]   Call placed by: Stanton Kidney, EMT-P,  September 19, 2010 3:22 PM Action Taken: Phone Call Completed Summary of Call: Left message with information on [employer] Information Sheet (see scanned document for details). Stanton Kidney, EMT-P  September 19, 2010 3:22 PM     Nuclear Med Background Indications for Stress Test: Evaluation for Ischemia   History: Echo  History Comments: Echo: EF= 55-60%  Symptoms: Chest Pain, Chest Pain with Exertion, Chest Pressure, Dizziness, Fatigue, SOB    Nuclear Pre-Procedure Cardiac Risk Factors: Family History - CAD, History of Smoking, Lipids Height (in): 68

## 2010-10-15 ENCOUNTER — Telehealth: Payer: Self-pay | Admitting: Internal Medicine

## 2010-10-30 NOTE — Progress Notes (Signed)
Summary: INR goal > 1.6 from 11/01/2010  Phone Note Outgoing Call   Summary of Call: pls call PMD and state that starting November 01, 2010 that INR goal is > 1.6. He should see me 9 months from prior OV.  Initial call taken by: Kalman Shan MD,  October 15, 2010 2:03 AM  Follow-up for Phone Call        Spoke wiht [place], Dr. Andrey Campanile nurse and advised of pt INR goal. Reminder placed for pt f/u in 9 months. Carron Curie CMA  October 23, 2010 11:38 AM

## 2010-11-12 LAB — CBC
HCT: 42.7 % (ref 39.0–52.0)
Hemoglobin: 14.8 g/dL (ref 13.0–17.0)
MCH: 29.5 pg (ref 26.0–34.0)
MCHC: 34.7 g/dL (ref 30.0–36.0)
MCV: 85.2 fL (ref 78.0–100.0)
Platelets: 246 10*3/uL (ref 150–400)
RBC: 5.01 MIL/uL (ref 4.22–5.81)
RDW: 13.3 % (ref 11.5–15.5)
WBC: 7.2 10*3/uL (ref 4.0–10.5)

## 2010-11-12 LAB — POCT CARDIAC MARKERS
CKMB, poc: <1 ng/mL — ABNORMAL LOW (ref 1.0–8.0)
Myoglobin, poc: 34.9 ng/mL (ref 12–200)
Troponin i, poc: <0.05 ng/mL (ref 0.00–0.09)

## 2010-11-12 LAB — DIFFERENTIAL
Basophils Absolute: 0.1 10*3/uL (ref 0.0–0.1)
Basophils Relative: 1 % (ref 0–1)
Eosinophils Absolute: 0.1 10*3/uL (ref 0.0–0.7)
Eosinophils Relative: 2 % (ref 0–5)
Lymphocytes Relative: 34 % (ref 12–46)
Lymphs Abs: 2.4 10*3/uL (ref 0.7–4.0)
Monocytes Absolute: 0.8 10*3/uL (ref 0.1–1.0)
Monocytes Relative: 11 % (ref 3–12)
Neutro Abs: 3.8 10*3/uL (ref 1.7–7.7)
Neutrophils Relative %: 53 % (ref 43–77)

## 2010-11-12 LAB — BASIC METABOLIC PANEL
BUN: 15 mg/dL (ref 6–23)
CO2: 25 mEq/L (ref 19–32)
Calcium: 9.2 mg/dL (ref 8.4–10.5)
Chloride: 104 mEq/L (ref 96–112)
Creatinine, Ser: 0.9 mg/dL (ref 0.4–1.5)
GFR calc Af Amer: >60 mL/min (ref >60)
GFR calc non Af Amer: >60 mL/min (ref >60)
Glucose, Bld: 89 mg/dL (ref 70–99)
Potassium: 4.1 mEq/L (ref 3.5–5.1)
Sodium: 140 mEq/L (ref 135–145)

## 2010-11-12 LAB — PROTIME-INR
INR: 1.89 — ABNORMAL HIGH (ref 0.00–1.49)
Prothrombin Time: 21.9 seconds — ABNORMAL HIGH (ref 11.6–15.2)

## 2010-11-15 LAB — COMPREHENSIVE METABOLIC PANEL
ALT: 14 U/L (ref 0–53)
AST: 19 U/L (ref 0–37)
Albumin: 4.1 g/dL (ref 3.5–5.2)
Alkaline Phosphatase: 81 U/L (ref 39–117)
BUN: 12 mg/dL (ref 6–23)
CO2: 27 mEq/L (ref 19–32)
Calcium: 9.1 mg/dL (ref 8.4–10.5)
Chloride: 106 mEq/L (ref 96–112)
Creatinine, Ser: 0.9 mg/dL (ref 0.4–1.5)
GFR calc Af Amer: >60 mL/min (ref >60)
GFR calc non Af Amer: >60 mL/min (ref >60)
Glucose, Bld: 85 mg/dL (ref 70–99)
Potassium: 3.9 mEq/L (ref 3.5–5.1)
Sodium: 139 mEq/L (ref 135–145)
Total Bilirubin: 0.7 mg/dL (ref 0.3–1.2)
Total Protein: 6.9 g/dL (ref 6.0–8.3)

## 2010-11-15 LAB — DIFFERENTIAL
Basophils Absolute: 0.1 10*3/uL (ref 0.0–0.1)
Basophils Relative: 1 % (ref 0–1)
Eosinophils Absolute: 0.1 10*3/uL (ref 0.0–0.7)
Eosinophils Relative: 2 % (ref 0–5)
Lymphocytes Relative: 33 % (ref 12–46)
Lymphs Abs: 2.3 10*3/uL (ref 0.7–4.0)
Monocytes Absolute: 0.6 10*3/uL (ref 0.1–1.0)
Monocytes Relative: 8 % (ref 3–12)
Neutro Abs: 3.9 10*3/uL (ref 1.7–7.7)
Neutrophils Relative %: 56 % (ref 43–77)

## 2010-11-15 LAB — LIPASE, BLOOD: Lipase: 37 U/L (ref 11–59)

## 2010-11-15 LAB — CBC
HCT: 44.6 % (ref 39.0–52.0)
Hemoglobin: 15.1 g/dL (ref 13.0–17.0)
MCH: 30.1 pg (ref 26.0–34.0)
MCHC: 33.8 g/dL (ref 30.0–36.0)
MCV: 89.2 fL (ref 78.0–100.0)
Platelets: 251 10*3/uL (ref 150–400)
RBC: 5 MIL/uL (ref 4.22–5.81)
RDW: 13.8 % (ref 11.5–15.5)
WBC: 6.9 10*3/uL (ref 4.0–10.5)

## 2010-11-21 LAB — CBC
HCT: 40.9 % (ref 39.0–52.0)
Hemoglobin: 14.2 g/dL (ref 13.0–17.0)
MCHC: 34.7 g/dL (ref 30.0–36.0)
MCV: 90.7 fL (ref 78.0–100.0)
Platelets: 201 10*3/uL (ref 150–400)
RBC: 4.51 MIL/uL (ref 4.22–5.81)
RDW: 13.4 % (ref 11.5–15.5)
WBC: 7.1 10*3/uL (ref 4.0–10.5)

## 2010-11-21 LAB — COMPREHENSIVE METABOLIC PANEL
ALT: 17 U/L (ref 0–53)
AST: 17 U/L (ref 0–37)
Albumin: 3.3 g/dL — ABNORMAL LOW (ref 3.5–5.2)
Alkaline Phosphatase: 92 U/L (ref 39–117)
BUN: 11 mg/dL (ref 6–23)
CO2: 26 mEq/L (ref 19–32)
Calcium: 8.6 mg/dL (ref 8.4–10.5)
Chloride: 105 mEq/L (ref 96–112)
Creatinine, Ser: 1.07 mg/dL (ref 0.4–1.5)
GFR calc Af Amer: >60 mL/min (ref >60)
GFR calc non Af Amer: >60 mL/min (ref >60)
Glucose, Bld: 108 mg/dL — ABNORMAL HIGH (ref 70–99)
Potassium: 4.1 mEq/L (ref 3.5–5.1)
Sodium: 138 mEq/L (ref 135–145)
Total Bilirubin: 1.1 mg/dL (ref 0.3–1.2)
Total Protein: 6.3 g/dL (ref 6.0–8.3)

## 2010-11-21 LAB — PROTIME-INR
INR: 1.02 (ref 0.00–1.49)
INR: 1.09 (ref 0.00–1.49)
Prothrombin Time: 13.3 seconds (ref 11.6–15.2)
Prothrombin Time: 14 seconds (ref 11.6–15.2)

## 2010-11-21 LAB — LIPID PANEL
Cholesterol: 164 mg/dL (ref 0–200)
HDL: 32 mg/dL — ABNORMAL LOW (ref >39)
LDL Cholesterol: 99 mg/dL (ref 0–99)
Total CHOL/HDL Ratio: 5.1 RATIO
Triglycerides: 166 mg/dL — ABNORMAL HIGH (ref <150)
VLDL: 33 mg/dL (ref 0–40)

## 2010-11-21 LAB — CARDIAC PANEL(CRET KIN+CKTOT+MB+TROPI)
CK, MB: 1.1 ng/mL (ref 0.3–4.0)
CK, MB: 1.4 ng/mL (ref 0.3–4.0)
CK, MB: 1.9 ng/mL (ref 0.3–4.0)
Relative Index: 1.6 (ref 0.0–2.5)
Relative Index: INVALID (ref 0.0–2.5)
Relative Index: INVALID (ref 0.0–2.5)
Total CK: 118 U/L (ref 7–232)
Total CK: 59 U/L (ref 7–232)
Total CK: 75 U/L (ref 7–232)
Troponin I: 0.02 ng/mL (ref 0.00–0.06)
Troponin I: 0.05 ng/mL (ref 0.00–0.06)
Troponin I: 0.07 ng/mL — ABNORMAL HIGH (ref 0.00–0.06)

## 2010-11-21 LAB — LUPUS ANTICOAGULANT PANEL
DRVVT: 40.3 secs (ref 36.2–44.3)
Lupus Anticoagulant: NOT DETECTED
PTT Lupus Anticoagulant: 42.5 secs (ref 32.0–43.4)

## 2010-11-21 LAB — FACTOR 5 LEIDEN

## 2010-11-21 LAB — TSH: TSH: 1.188 u[IU]/mL (ref 0.350–4.500)

## 2010-11-21 LAB — PROTEIN C, TOTAL: Protein C, Total: 90 % (ref 70–140)

## 2010-11-21 LAB — PROTEIN S, TOTAL: Protein S Ag, Total: 89 % (ref 70–140)

## 2010-11-21 LAB — ANTITHROMBIN III: AntiThromb III Func: 97 % (ref 76–126)

## 2010-11-23 LAB — CBC
HCT: 47.1 % (ref 39.0–52.0)
Hemoglobin: 15.7 g/dL (ref 13.0–17.0)
MCHC: 33.3 g/dL (ref 30.0–36.0)
MCV: 90.1 fL (ref 78.0–100.0)
Platelets: 241 10*3/uL (ref 150–400)
RBC: 5.22 MIL/uL (ref 4.22–5.81)
RDW: 12.9 % (ref 11.5–15.5)
WBC: 9.4 10*3/uL (ref 4.0–10.5)

## 2010-11-23 LAB — PROTIME-INR
INR: 0.92 (ref 0.00–1.49)
Prothrombin Time: 12.3 seconds (ref 11.6–15.2)

## 2010-11-23 LAB — POCT CARDIAC MARKERS
CKMB, poc: 2.7 ng/mL (ref 1.0–8.0)
Myoglobin, poc: 84.7 ng/mL (ref 12–200)
Troponin i, poc: 0.11 ng/mL — ABNORMAL HIGH (ref 0.00–0.09)

## 2010-11-23 LAB — D-DIMER, QUANTITATIVE: D-Dimer, Quant: 2.97 ug/mL-FEU — ABNORMAL HIGH (ref 0.00–0.48)

## 2010-11-23 LAB — POCT B-TYPE NATRIURETIC PEPTIDE (BNP): B Natriuretic Peptide, POC: 13.3 pg/mL (ref 0–100)

## 2010-11-23 LAB — DIFFERENTIAL
Basophils Absolute: 0 10*3/uL (ref 0.0–0.1)
Basophils Relative: 0 % (ref 0–1)
Eosinophils Absolute: 0.1 10*3/uL (ref 0.0–0.7)
Eosinophils Relative: 1 % (ref 0–5)
Lymphocytes Relative: 18 % (ref 12–46)
Lymphs Abs: 1.7 10*3/uL (ref 0.7–4.0)
Monocytes Absolute: 0.8 10*3/uL (ref 0.1–1.0)
Monocytes Relative: 9 % (ref 3–12)
Neutro Abs: 6.8 10*3/uL (ref 1.7–7.7)
Neutrophils Relative %: 71 % (ref 43–77)

## 2010-11-23 LAB — BASIC METABOLIC PANEL
BUN: 14 mg/dL (ref 6–23)
CO2: 29 mEq/L (ref 19–32)
Calcium: 9.2 mg/dL (ref 8.4–10.5)
Chloride: 106 mEq/L (ref 96–112)
Creatinine, Ser: 1 mg/dL (ref 0.4–1.5)
GFR calc Af Amer: >60 mL/min (ref >60)
GFR calc non Af Amer: >60 mL/min (ref >60)
Glucose, Bld: 95 mg/dL (ref 70–99)
Potassium: 4.4 mEq/L (ref 3.5–5.1)
Sodium: 143 mEq/L (ref 135–145)

## 2010-11-23 LAB — APTT: aPTT: 31 seconds (ref 24–37)

## 2010-11-26 LAB — BASIC METABOLIC PANEL
BUN: 12 mg/dL (ref 6–23)
CO2: 27 mEq/L (ref 19–32)
Calcium: 8.8 mg/dL (ref 8.4–10.5)
Chloride: 106 mEq/L (ref 96–112)
Creatinine, Ser: 0.9 mg/dL (ref 0.4–1.5)
GFR calc Af Amer: >60 mL/min (ref >60)
GFR calc non Af Amer: >60 mL/min (ref >60)
Glucose, Bld: 97 mg/dL (ref 70–99)
Potassium: 4.2 mEq/L (ref 3.5–5.1)
Sodium: 140 mEq/L (ref 135–145)

## 2010-11-26 LAB — APTT: aPTT: 41 seconds — ABNORMAL HIGH (ref 24–37)

## 2010-11-26 LAB — DIFFERENTIAL
Basophils Absolute: 0.1 10*3/uL (ref 0.0–0.1)
Basophils Relative: 1 % (ref 0–1)
Eosinophils Absolute: 0.2 10*3/uL (ref 0.0–0.7)
Eosinophils Relative: 2 % (ref 0–5)
Lymphocytes Relative: 29 % (ref 12–46)
Lymphs Abs: 2.7 10*3/uL (ref 0.7–4.0)
Monocytes Absolute: 0.7 10*3/uL (ref 0.1–1.0)
Monocytes Relative: 8 % (ref 3–12)
Neutro Abs: 5.3 10*3/uL (ref 1.7–7.7)
Neutrophils Relative %: 60 % (ref 43–77)

## 2010-11-26 LAB — CBC
HCT: 46.6 % (ref 39.0–52.0)
Hemoglobin: 16.2 g/dL (ref 13.0–17.0)
MCHC: 34.6 g/dL (ref 30.0–36.0)
MCV: 89.5 fL (ref 78.0–100.0)
Platelets: 296 10*3/uL (ref 150–400)
RBC: 5.21 MIL/uL (ref 4.22–5.81)
RDW: 12.5 % (ref 11.5–15.5)
WBC: 9 10*3/uL (ref 4.0–10.5)

## 2010-11-26 LAB — HEMOCCULT GUIAC POC 1CARD (OFFICE): Fecal Occult Bld: NEGATIVE

## 2010-11-26 LAB — URINALYSIS, ROUTINE W REFLEX MICROSCOPIC
Bilirubin Urine: NEGATIVE
Glucose, UA: NEGATIVE mg/dL
Ketones, ur: NEGATIVE mg/dL
Leukocytes, UA: NEGATIVE
Nitrite: NEGATIVE
Protein, ur: NEGATIVE mg/dL
Specific Gravity, Urine: 1.01 (ref 1.005–1.030)
Urobilinogen, UA: 0.2 mg/dL (ref 0.0–1.0)
pH: 6 (ref 5.0–8.0)

## 2010-11-26 LAB — URINE CULTURE
Colony Count: NO GROWTH
Culture: NO GROWTH

## 2010-11-26 LAB — URINE MICROSCOPIC-ADD ON

## 2010-11-26 LAB — PROTIME-INR
INR: 2.38 — ABNORMAL HIGH (ref 0.00–1.49)
Prothrombin Time: 25.8 seconds — ABNORMAL HIGH (ref 11.6–15.2)

## 2011-01-02 ENCOUNTER — Encounter: Payer: Self-pay | Admitting: Cardiology

## 2011-01-02 DIAGNOSIS — N200 Calculus of kidney: Secondary | ICD-10-CM | POA: Insufficient documentation

## 2011-01-02 DIAGNOSIS — M545 Low back pain, unspecified: Secondary | ICD-10-CM | POA: Insufficient documentation

## 2011-01-02 DIAGNOSIS — N400 Enlarged prostate without lower urinary tract symptoms: Secondary | ICD-10-CM | POA: Insufficient documentation

## 2011-01-02 DIAGNOSIS — K5792 Diverticulitis of intestine, part unspecified, without perforation or abscess without bleeding: Secondary | ICD-10-CM | POA: Insufficient documentation

## 2011-01-02 DIAGNOSIS — N3000 Acute cystitis without hematuria: Secondary | ICD-10-CM | POA: Insufficient documentation

## 2011-01-02 DIAGNOSIS — R079 Chest pain, unspecified: Secondary | ICD-10-CM | POA: Insufficient documentation

## 2011-01-02 DIAGNOSIS — R0600 Dyspnea, unspecified: Secondary | ICD-10-CM | POA: Insufficient documentation

## 2011-01-02 DIAGNOSIS — I2699 Other pulmonary embolism without acute cor pulmonale: Secondary | ICD-10-CM | POA: Insufficient documentation

## 2011-01-02 DIAGNOSIS — E785 Hyperlipidemia, unspecified: Secondary | ICD-10-CM | POA: Insufficient documentation

## 2011-01-09 ENCOUNTER — Ambulatory Visit (INDEPENDENT_AMBULATORY_CARE_PROVIDER_SITE_OTHER): Payer: BC Managed Care – PPO | Admitting: Cardiology

## 2011-01-09 ENCOUNTER — Encounter: Payer: Self-pay | Admitting: Cardiology

## 2011-01-09 DIAGNOSIS — I2699 Other pulmonary embolism without acute cor pulmonale: Secondary | ICD-10-CM

## 2011-01-09 DIAGNOSIS — R079 Chest pain, unspecified: Secondary | ICD-10-CM

## 2011-01-09 NOTE — Progress Notes (Signed)
   Sean Winters Date of Birth: 06/14/1956   History of Present Illness: Sean Winters is seen for followup today. He has been feeling very well. He has rare symptoms of chest tightness. He's had no other chest pain or shortness of breath. He does have chronic edema. He has been exercising more with running.  Current Outpatient Prescriptions on File Prior to Visit  Medication Sig Dispense Refill  . aspirin 81 MG tablet Take 81 mg by mouth daily.        . pravastatin (PRAVACHOL) 40 MG tablet Take 40 mg by mouth daily.        . Tamsulosin HCl (FLOMAX) 0.4 MG CAPS Take 0.4 mg by mouth daily.        . traZODone (DESYREL) 50 MG tablet Take 50 mg by mouth as needed.        . warfarin (COUMADIN) 5 MG tablet Take 5 mg by mouth daily. AS DIRECTED       . DISCONTD: fluticasone (FLONASE) 50 MCG/ACT nasal spray 2 sprays by Nasal route as needed.          Allergies  Allergen Reactions  . Sulfonamide Derivatives     Past Medical History  Diagnosis Date  . Pulmonary embolism 10/2009  . Dyslipidemia   . Cystitis, acute   . Diverticulitis   . Lumbago   . Nephrolithiasis   . BPH (benign prostatic hypertrophy)   . Chest pain   . Dyspnea     Past Surgical History  Procedure Date  . Kidney stones     REMOVAL   . Knee arthroscopy     RIGHT KNEE  . Tonsillectomy     History  Smoking status  . Former Smoker  . Quit date: 09/03/1987  Smokeless tobacco  . Not on file    History  Alcohol Use No    Family History  Problem Relation Age of Onset  . Heart attack Father 61  . Hyperlipidemia Father   . Diabetes Father   . Hypertension Father   . Fibromyalgia Sister   . Stroke Mother   . Arrhythmia Mother     atrial fib.    Review of Systems:  All other systems were reviewed and are negative.  Physical Exam: BP 140/88  Pulse 72  Ht 5\' 8"  (1.727 m)  Wt 228 lb 3.2 oz (103.511 kg)  BMI 34.70 kg/m2 He is an obese white male in no acute distress. His HEENT exam is unremarkable. He  has no JVD or bruits. Lungs are clear. Cardiac exam reveals a regular rate and rhythm without gallop, murmur, or click. His abdomen is obese, soft, nontender. He has 1+ lower extremity edema. LABORATORY DATA: From January 2012 his echocardiogram showed mild LVH with normal systolic function. There was evidence of some diastolic dysfunction. His left atrium was mildly dilated. The right ventricle is mildly dilated. His IVC was normal. He had no tricuspid insufficiency so there did not appear to be significant pulmonary hypertension. His stress Cardiolite study at the same time demonstrated no evidence of ischemia and normal ejection fraction of 60%.  Assessment / Plan:

## 2011-01-09 NOTE — Assessment & Plan Note (Signed)
Normal stress nuclear study. Ejection fraction is normal. Patient's symptoms have improved significantly and he is being more active.

## 2011-01-09 NOTE — Patient Instructions (Signed)
Stay active and lose weight.  We will be happy to see you back as needed.

## 2011-01-09 NOTE — Assessment & Plan Note (Signed)
Based on his prior cardiac evaluation there does not appear to be any significant long-term cardiac sequela from his pulmonary emboli. He has no evidence of coronary disease by stress testing. I have reassured him concerning these findings and we will see him back on a p.r.n. Basis.

## 2011-01-18 NOTE — H&P (Signed)
NAME:  LOWERY, PAULLIN NO.:  [id number]   MEDICAL RECORD NO.:  [id number]                   PATIENT TYPE:  EMS   LOCATION:  [facility]                                 FACILITY:  [facility]   PHYSICIAN:  Maurice March, M.D.                  DATE OF BIRTH:  07-20-1956   DATE OF ADMISSION:  10/05/2003  DATE OF DISCHARGE:                                HISTORY & PHYSICAL   CHIEF COMPLAINT:  Abdominal pain.   HISTORY OF PRESENT ILLNESS:  Mr. Shughart is a 55 year old white male with no  significant past medical history who presented with lower abdominal pain  three days ago to his primary care physician.  His rectal exam at that time  was consistent with prostatitis, and he was placed on Cipro.  His pain  worsened, and he returned to his primary today.  A CT was ordered of the  abdomen which showed a ruptured sigmoid diverticula without abscess.  He was  sent her for IV antibiotic therapy.   REVIEW OF SYSTEMS:  No fevers, but occasional chills and a documented  temperature of 100.3 at [facility] to date.  No headaches,  no chest pain or dyspnea, no nausea, vomiting, diarrhea or constipation.  No  dysuria or hematuria.  No lower-extremity edema.  Some anorexia but no  weight changes.   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   ALLERGIES:  SULFA DRUGS.   MEDICATIONS:  1. P.r.n. Advil.  2. Cipro day #3 today.  3. A bladder relaxant.   SOCIAL HISTORY:  He is married x14 years.  This is his second marriage.  He  has three children and one stepchild.  He is a Hotel manager and travels  frequently.  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  He denies tobacco.  Occasional alcohol use.   FAMILY HISTORY:  His mother has diverticulitis.  His father has coronary  artery disease.  They are both  living.   PHYSICAL EXAMINATION:  VITAL SIGNS:  Temperature 98.3, heart rate 76, blood  pressure 155/69.  Respirations 20 sitting, 98% on room air.  GENERAL:  He is alert, pleasant, and in no acute distress.  HEENT:  Pupils are equal, round and reactive to light.  Extraocular muscles  are intact.  Oropharynx is clear with moist mucous membranes.  NECK:  No lymphadenopathy, no JVD, no thyromegaly.  LUNGS:  Clear to auscultation bilaterally.  Good air movement.  No wheezes,  rhonchi or crackles.  ABDOMEN:  Hyperactive bowel sounds, soft, nontender, nondistended.  No  organomegaly.  No rebound or guarding.  RECTAL:  Per Dr. Andrey Campanile, negative guaiac, slight tenderness.  EXTREMITIES:  No clubbing, cyanosis or edema.  NEUROLOGIC:  Cranial nerves are grossly intact.  He  moves all four  extremities with equal strength.   LABORATORY DATA:  Sodium 137, potassium 3.7, chloride 104, bicarbonate 27,  BUN 9, creatinine 1.0, glucose 86, calcium 9.0.  White blood cell count  10.2.  Hemoglobin 13.7, platelets 225, 79% segmented neutrophils, 14%  lymphocytes.  MCV 90.7.   Abdominal CT from [facility] shows a perforated diverticulum of the sigmoid colon  without abscess.   ASSESSMENT:  This is a 55 year old otherwise healthy male with a ruptured  diverticular.   PLAN:  Start IV Flagyl and Cipro.  We will allow for clear liquids tonight  since the patient's exam is not acute.  Give Tylenol and morphine p.r.n.  I  currently do not see a need for surgical consultation tonight, but  reconsider this if his situation changes or worsens.                                                Maurice March, M.D.    LC/MEDQ  D:  10/05/2003  T:  10/05/2003  Job:  [id number]   cc:   Gloriajean Dell. Andrey Campanile, M.D.  [address]  [place]  [place] [address]  Fax: [phone]

## 2011-05-29 LAB — COMPREHENSIVE METABOLIC PANEL
ALT: 58 — ABNORMAL HIGH
AST: 79 — ABNORMAL HIGH
Albumin: 3.9
Alkaline Phosphatase: 84
BUN: 9
CO2: 26
Calcium: 9
Chloride: 104
Creatinine, Ser: 1.19
GFR calc Af Amer: >60
GFR calc non Af Amer: >60
Glucose, Bld: 94
Potassium: 4.2
Sodium: 137
Total Bilirubin: 1.3 — ABNORMAL HIGH
Total Protein: 6.7

## 2011-05-29 LAB — URINALYSIS, ROUTINE W REFLEX MICROSCOPIC
Bilirubin Urine: NEGATIVE
Glucose, UA: NEGATIVE
Hgb urine dipstick: NEGATIVE
Ketones, ur: NEGATIVE
Nitrite: NEGATIVE
Protein, ur: NEGATIVE
Specific Gravity, Urine: >1.046 — ABNORMAL HIGH
Urobilinogen, UA: 0.2
pH: 5.5

## 2011-05-29 LAB — CBC
HCT: 46.2
Hemoglobin: 15.9
MCHC: 34.3
MCV: 90.4
Platelets: 262
RBC: 5.11
RDW: 13.2
WBC: 8.1

## 2011-05-29 LAB — DIFFERENTIAL
Basophils Absolute: 0
Basophils Relative: 0
Eosinophils Absolute: 0.2
Eosinophils Relative: 2
Lymphocytes Relative: 9 — ABNORMAL LOW
Lymphs Abs: 0.7
Monocytes Absolute: 0.5
Monocytes Relative: 6
Neutro Abs: 6.7
Neutrophils Relative %: 83 — ABNORMAL HIGH

## 2011-06-24 ENCOUNTER — Ambulatory Visit: Payer: BC Managed Care – PPO | Admitting: Internal Medicine

## 2011-06-28 ENCOUNTER — Encounter: Payer: Self-pay | Admitting: Internal Medicine

## 2011-06-28 ENCOUNTER — Ambulatory Visit (INDEPENDENT_AMBULATORY_CARE_PROVIDER_SITE_OTHER): Payer: BC Managed Care – PPO | Admitting: Internal Medicine

## 2011-06-28 VITALS — BP 128/88 | HR 80 | Temp 98.1°F | Ht 68.0 in | Wt 232.2 lb

## 2011-06-28 DIAGNOSIS — I2699 Other pulmonary embolism without acute cor pulmonale: Secondary | ICD-10-CM

## 2011-06-28 NOTE — Progress Notes (Signed)
  Subjective:    Patient ID: Sean Winters, male    DOB: [birth year], 55 y.o.   MRN: [id number]  HPI           OV 06/28/11: Last seen Jan 2012. Followup  pulmponary embolism - idipathich feb 2011 randomized to coumadin on EINSTEIN study. Coumadin for INR > 2 through March 2012. FU CT for chest pain dec 2011: negative for PE. STress cardioliite 2012: negative for CAD  He is supposed to be on coumadin for INR > 1.6  Since March 2012 for another 1-2 years to prevent DVT/PE recurrence. However, he tells me that PMD has maintained him on coumadin for INR goal > 2. INR recently was 2.5 or soemthing like that. He is asymptomatic from resp/chest standpoint. No new complaints. He is not too keen on taking coumadin at the higher dose. He is also on baby aspirin.  Denies associated pedal edema  PMHx - interim prostate issues now resolved Social, fam hx: no change      Review of Systems  Constitutional: Negative for fever and unexpected weight change.  HENT: Negative for ear pain, nosebleeds, congestion, sore throat, rhinorrhea, sneezing, trouble swallowing, dental problem, postnasal drip and sinus pressure.   Eyes: Negative for redness and itching.  Respiratory: Negative for cough, chest tightness, shortness of breath and wheezing.   Cardiovascular: Negative for palpitations and leg swelling.  Gastrointestinal: Negative for nausea and vomiting.  Genitourinary: Negative for dysuria.  Musculoskeletal: Negative for joint swelling.  Skin: Negative for rash.  Neurological: Negative for headaches.  Hematological: Does not bruise/bleed easily.  Psychiatric/Behavioral: Negative for dysphoric mood. The patient is not nervous/anxious.        Objective:   Physical Exam  General: ow male in nad  Head: normocephalic and atraumatic  Eyes: PERRLA and EOMI.  Ears: TMs intact and clear with normal canals  Nose: no nasal discharge or drainage noted.  Mouth: very small oropharynx, elongation of soft  palate, difficult to see uvula  Neck: no jvd, tmg, LN  Chest Wall: no deformities noted  Lungs: faint basilar crackles.  Heart: rrr, no mrg  Abdomen: soft and nontender, bs+  Msk: no deformity or scoliosis noted with normal posture  Pulses: pulses normal  Extremities: no edema or cyanosis  Neurologic: alert, does not appear sleepy, moves all 4.  Skin: intact without lesions or rashes  Cervical Nodes: no significant adenopathy  Axillary Nodes: no significant adenopathy  Psych: alert and cooperative; normal mood and affect; normal attention span and concentration      Assessment & Plan:

## 2011-06-28 NOTE — Assessment & Plan Note (Signed)
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 We again discussed Rx recommendation. Explained 2nd PE means Rx for life with coumadin. IDea of extended coumadin > 6 months is to prevent recurrence. Explained that Rx beyond 6 months is individualized decision. He has completed 18 months. Explained I would recommend INR 1.6 -2 for another 6-12 months. AFter that daily aspirin and heparin sq for travels  Should suffice. We will get D-dimer and hypercoag profile when he is off Rx. He is acceptable with plan.   I dw PMD Dr. Andrey Campanile over phone and he will lower coumadin dose  > 25 min visit. > 50% of time face to face counseling

## 2011-06-28 NOTE — Patient Instructions (Addendum)
Continue coumadin but cut dose down - goal INR > 1.6 but < 2: I have d/w this with Dr Andrey Campanile Continue aspirin Return to see me in March 2012 when we can discuss stopping coumadin

## 2011-11-28 ENCOUNTER — Ambulatory Visit: Payer: BC Managed Care – PPO | Admitting: Internal Medicine

## 2012-01-22 ENCOUNTER — Encounter: Payer: Self-pay | Admitting: Internal Medicine

## 2012-01-22 ENCOUNTER — Ambulatory Visit (INDEPENDENT_AMBULATORY_CARE_PROVIDER_SITE_OTHER): Payer: BC Managed Care – PPO | Admitting: Internal Medicine

## 2012-01-22 VITALS — BP 118/78 | HR 69 | Temp 98.0°F | Ht 68.0 in | Wt 230.4 lb

## 2012-01-22 DIAGNOSIS — I2699 Other pulmonary embolism without acute cor pulmonale: Secondary | ICD-10-CM

## 2012-01-22 NOTE — Assessment & Plan Note (Signed)
We reviewed side effect of coumadin on uptodate and gave him handout; depression is not listed. He has accepted this.  We discussed risk - benefit ratio and we agreed to do coumadin a 3rd  Year with inr goal > 1.5  PLAN As discussed we have agreed you will continue coumadin for goal INR 1.5 - 2 for one more year which will be your 3rd year on coumadin Return in 1 year or sooner if needed Any bleeding or any issues related to coumadin please let me know asap  . > 50% of this 15 min visit spent in face to face counseling

## 2012-01-22 NOTE — Progress Notes (Signed)
  Subjective:    Patient ID: Sean Winters, male    DOB: [birth year], 56 y.o.   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   -  FU CT for chest pain dec 2011: negative for PE.   - [facility] cardioliite 2012: negative for CAD   - Coumadin for INR > 2 through October 2012 - Coumadin for INR > 1.07 June 2011 onwards    OV 01/22/2012  Last seen 06/28/11. Followup for above. He has now completed 2 years coumadin Rx (last 6 months with INR goal 1.5 - 2). Denies any issues. Coumadin or blood draws not impacting quality of life. Denies falls or trauma. Past 6 weeks depressed and SSRI started and with this mood is better. He wonders if mood imblance is due to coumadin. Otherwise, he is wiling to continue with coumadin Rx. No otherr complaints. Lost weight intentially with diet and exercise but now gained some of it back after a cruise  Past, Family, Social reviewed: no change since last visit   Body mass index is 35.03 kg/(m^2).       Review of Systems  Constitutional: Negative for fever and unexpected weight change.  HENT: Negative for ear pain, nosebleeds, congestion, sore throat, rhinorrhea, sneezing, trouble swallowing, dental problem, postnasal drip and sinus pressure.   Eyes: Negative for redness and itching.  Respiratory: Negative for cough, chest tightness, shortness of breath and wheezing.   Cardiovascular: Negative for palpitations and leg swelling.  Gastrointestinal: Negative for nausea and vomiting.  Genitourinary: Negative for dysuria.  Musculoskeletal: Negative for joint swelling.  Skin: Negative for rash.  Neurological: Negative for headaches.  Hematological: Does not bruise/bleed easily.  Psychiatric/Behavioral: Negative for dysphoric mood. The patient is not nervous/anxious.        Objective:   Physical Exam General: ow male in nad  Head: normocephalic and atraumatic  Eyes: PERRLA and EOMI.  Ears: TMs  intact and clear with normal canals  Nose: no nasal discharge or drainage noted.  Mouth: very small oropharynx, elongation of soft palate, difficult to see uvula  Neck: no jvd, tmg, LN  Chest Wall: no deformities noted  Lungs: faint basilar crackles.  Heart: rrr, no mrg  Abdomen: soft and nontender, bs+  Msk: no deformity or scoliosis noted with normal posture  Pulses: pulses normal  Extremities: no edema or cyanosis  Neurologic: alert, does not appear sleepy, moves all 4.  Skin: intact without lesions or rashes  Cervical Nodes: no significant adenopathy  Axillary Nodes: no significant adenopathy  Psych: alert and cooperative; normal mood and affect; normal attention span and concentration        Assessment & Plan:

## 2012-01-22 NOTE — Patient Instructions (Signed)
As discussed we have agreed you will continue coumadin for goal INR 1.5 - 2 for one more year which will be your 3rd year on coumadin Return in 1 year or sooner if needed Any bleeding or any issues related to coumadin please let me know asap

## 2012-03-13 ENCOUNTER — Observation Stay (HOSPITAL_COMMUNITY)
Admission: EM | Admit: 2012-03-13 | Discharge: 2012-03-15 | Disposition: A | Discharge status: Home / Self Care | Payer: BC Managed Care – PPO | Attending: Internal Medicine | Admitting: Internal Medicine

## 2012-03-13 ENCOUNTER — Emergency Department (HOSPITAL_COMMUNITY): Payer: BC Managed Care – PPO

## 2012-03-13 DIAGNOSIS — R0609 Other forms of dyspnea: Secondary | ICD-10-CM

## 2012-03-13 DIAGNOSIS — R0989 Other specified symptoms and signs involving the circulatory and respiratory systems: Secondary | ICD-10-CM

## 2012-03-13 DIAGNOSIS — R4182 Altered mental status, unspecified: Secondary | ICD-10-CM | POA: Insufficient documentation

## 2012-03-13 DIAGNOSIS — I2699 Other pulmonary embolism without acute cor pulmonale: Secondary | ICD-10-CM

## 2012-03-13 DIAGNOSIS — R1031 Right lower quadrant pain: Secondary | ICD-10-CM

## 2012-03-13 DIAGNOSIS — K5792 Diverticulitis of intestine, part unspecified, without perforation or abscess without bleeding: Secondary | ICD-10-CM

## 2012-03-13 DIAGNOSIS — F3289 Other specified depressive episodes: Secondary | ICD-10-CM | POA: Insufficient documentation

## 2012-03-13 DIAGNOSIS — M545 Low back pain, unspecified: Secondary | ICD-10-CM

## 2012-03-13 DIAGNOSIS — Z87898 Personal history of other specified conditions: Secondary | ICD-10-CM

## 2012-03-13 DIAGNOSIS — R410 Disorientation, unspecified: Secondary | ICD-10-CM

## 2012-03-13 DIAGNOSIS — R079 Chest pain, unspecified: Secondary | ICD-10-CM

## 2012-03-13 DIAGNOSIS — Z86711 Personal history of pulmonary embolism: Secondary | ICD-10-CM | POA: Insufficient documentation

## 2012-03-13 DIAGNOSIS — F329 Major depressive disorder, single episode, unspecified: Secondary | ICD-10-CM | POA: Insufficient documentation

## 2012-03-13 DIAGNOSIS — N400 Enlarged prostate without lower urinary tract symptoms: Secondary | ICD-10-CM

## 2012-03-13 DIAGNOSIS — N3000 Acute cystitis without hematuria: Secondary | ICD-10-CM

## 2012-03-13 DIAGNOSIS — N200 Calculus of kidney: Secondary | ICD-10-CM

## 2012-03-13 DIAGNOSIS — R404 Transient alteration of awareness: Principal | ICD-10-CM | POA: Insufficient documentation

## 2012-03-13 DIAGNOSIS — Z7901 Long term (current) use of anticoagulants: Secondary | ICD-10-CM | POA: Insufficient documentation

## 2012-03-13 DIAGNOSIS — R0600 Dyspnea, unspecified: Secondary | ICD-10-CM

## 2012-03-13 DIAGNOSIS — E785 Hyperlipidemia, unspecified: Secondary | ICD-10-CM | POA: Insufficient documentation

## 2012-03-13 LAB — CBC WITH DIFFERENTIAL/PLATELET
Basophils Absolute: 0.1 10*3/uL (ref 0.0–0.1)
Basophils Relative: 1 % (ref 0–1)
Eosinophils Absolute: 0.2 10*3/uL (ref 0.0–0.7)
Eosinophils Relative: 2 % (ref 0–5)
HCT: 42.2 % (ref 39.0–52.0)
Hemoglobin: 14.8 g/dL (ref 13.0–17.0)
Lymphocytes Relative: 31 % (ref 12–46)
Lymphs Abs: 2.2 10*3/uL (ref 0.7–4.0)
MCH: 30.3 pg (ref 26.0–34.0)
MCHC: 35.1 g/dL (ref 30.0–36.0)
MCV: 86.5 fL (ref 78.0–100.0)
Monocytes Absolute: 0.8 10*3/uL (ref 0.1–1.0)
Monocytes Relative: 11 % (ref 3–12)
Neutro Abs: 3.9 10*3/uL (ref 1.7–7.7)
Neutrophils Relative %: 55 % (ref 43–77)
Platelets: 227 10*3/uL (ref 150–400)
RBC: 4.88 MIL/uL (ref 4.22–5.81)
RDW: 13 % (ref 11.5–15.5)
WBC: 7.1 10*3/uL (ref 4.0–10.5)

## 2012-03-13 LAB — COMPREHENSIVE METABOLIC PANEL
ALT: 14 U/L (ref 0–53)
AST: 14 U/L (ref 0–37)
Albumin: 3.8 g/dL (ref 3.5–5.2)
Alkaline Phosphatase: 96 U/L (ref 39–117)
BUN: 18 mg/dL (ref 6–23)
CO2: 26 mEq/L (ref 19–32)
Calcium: 9 mg/dL (ref 8.4–10.5)
Chloride: 103 mEq/L (ref 96–112)
Creatinine, Ser: 0.97 mg/dL (ref 0.50–1.35)
GFR calc Af Amer: >90 mL/min (ref >90)
GFR calc non Af Amer: 90 mL/min — ABNORMAL LOW (ref >90)
Glucose, Bld: 90 mg/dL (ref 70–99)
Potassium: 3.9 mEq/L (ref 3.5–5.1)
Sodium: 138 mEq/L (ref 135–145)
Total Bilirubin: 0.4 mg/dL (ref 0.3–1.2)
Total Protein: 6.9 g/dL (ref 6.0–8.3)

## 2012-03-13 LAB — PROTIME-INR
INR: 1.88 — ABNORMAL HIGH (ref 0.00–1.49)
Prothrombin Time: 21.9 seconds — ABNORMAL HIGH (ref 11.6–15.2)

## 2012-03-13 LAB — RAPID URINE DRUG SCREEN, HOSP PERFORMED
Amphetamines: NOT DETECTED
Barbiturates: NOT DETECTED
Benzodiazepines: NOT DETECTED
Cocaine: NOT DETECTED
Opiates: NOT DETECTED
Tetrahydrocannabinol: NOT DETECTED

## 2012-03-13 LAB — URINALYSIS, ROUTINE W REFLEX MICROSCOPIC
Bilirubin Urine: NEGATIVE
Glucose, UA: NEGATIVE mg/dL
Hgb urine dipstick: NEGATIVE
Ketones, ur: NEGATIVE mg/dL
Leukocytes, UA: NEGATIVE
Nitrite: NEGATIVE
Protein, ur: NEGATIVE mg/dL
Specific Gravity, Urine: 1.018 (ref 1.005–1.030)
Urobilinogen, UA: 0.2 mg/dL (ref 0.0–1.0)
pH: 6.5 (ref 5.0–8.0)

## 2012-03-13 LAB — APTT: aPTT: 38 seconds — ABNORMAL HIGH (ref 24–37)

## 2012-03-13 LAB — ETHANOL: Alcohol, Ethyl (B): <11 mg/dL (ref 0–11)

## 2012-03-13 NOTE — ED Notes (Signed)
Inc. Confusion this past week; functioning well at work; feel like going in 3 different directions; trouble making decisions. Some mild blurred vision - believes it is from Wellbutrin of 4 mos. Conversing well.

## 2012-03-13 NOTE — ED Provider Notes (Signed)
History     CSN: [id number]  Arrival date & time 03/13/12  1405   First MD Initiated Contact with Patient 03/13/12 1820      Chief Complaint  Patient presents with  . Altered Mental Status  . Memory Loss    (Consider location/radiation/quality/duration/timing/severity/associated sxs/prior treatment) Patient is a 56 y.o. male presenting with altered mental status. The history is provided by the patient.  Altered Mental Status This is a new problem. Pertinent negatives include no chest pain and no abdominal pain.   patient's had some difficulty speaking and some confusion for the last week. He states he's had trouble making decisions. He's had some difficulty with memory. Is also had some occasional blurred vision. No headaches. No localizing numbness or weakness. No change in medications. He has had some stress in his job, but patient states is no more than normal. He is on Coumadin after previous bilateral pulmonary embolisms. He does not know why he got pulmonary embolisms. There is no immobility involved.  Past Medical History  Diagnosis Date  . Pulmonary embolism 10/2009  . Dyslipidemia   . Cystitis, acute   . Diverticulitis   . Lumbago   . Nephrolithiasis   . BPH (benign prostatic hypertrophy)   . Chest pain   . Dyspnea     Past Surgical History  Procedure Date  . Kidney stones     REMOVAL   . Knee arthroscopy     RIGHT KNEE  . Tonsillectomy     Family History  Problem Relation Age of Onset  . Heart attack Father 9  . Hyperlipidemia Father   . Diabetes Father   . Hypertension Father   . Fibromyalgia Sister   . Stroke Mother   . Arrhythmia Mother     atrial fib.    History  Substance Use Topics  . Smoking status: Former Smoker -- 1.0 packs/day for 18 years    Types: Cigarettes    Quit date: 09/03/1987  . Smokeless tobacco: Not on file  . Alcohol Use: No      Review of Systems  Constitutional: Negative for fever, activity change and fatigue.    HENT: Negative for sneezing.   Eyes: Negative for pain.  Respiratory: Negative for chest tightness.   Cardiovascular: Negative for chest pain.  Gastrointestinal: Negative for abdominal pain.  Genitourinary: Negative for frequency.  Musculoskeletal: Negative for back pain.  Neurological: Positive for speech difficulty. Negative for light-headedness.  Hematological: Negative for adenopathy.  Psychiatric/Behavioral: Positive for confusion and altered mental status.    Allergies  Sulfa antibiotics  Home Medications   Current Outpatient Rx  Name Route Sig Dispense Refill  . ASPIRIN 81 MG PO TABS Oral Take 81 mg by mouth daily.      . ATORVASTATIN CALCIUM 20 MG PO TABS Oral Take 1 tablet by mouth Daily.    . BUPROPION HCL ER (XL) 150 MG PO TB24 Oral Take 1 tablet by mouth Daily.    [name] FLUTICASONE PROPIONATE 50 MCG/ACT NA SUSP Nasal Place 2 sprays into the nose daily as needed. For allergies    . SOLIFENACIN SUCCINATE 5 MG PO TABS Oral Take 5 mg by mouth daily.      . WARFARIN SODIUM 7.5 MG PO TABS Oral Take 7.5 mg by mouth daily.    . TRAZODONE HCL 50 MG PO TABS Oral Take 50 mg by mouth as needed.        BP 111/69  Pulse 58  Temp 97.1 F (36.2 C) (  Oral)  Resp 16  SpO2 98%  Physical Exam  Nursing note and vitals reviewed. Constitutional: He is oriented to person, place, and time. He appears well-developed and well-nourished.  HENT:  Head: Normocephalic and atraumatic.  Eyes: EOM are normal. Pupils are equal, round, and reactive to light.  Neck: Normal range of motion. Neck supple.  Cardiovascular: Normal rate, regular rhythm and normal heart sounds.   No murmur heard. Pulmonary/Chest: Effort normal and breath sounds normal.  Abdominal: Soft. Bowel sounds are normal. He exhibits no distension and no mass. There is no tenderness. There is no rebound and no guarding.  Musculoskeletal: Normal range of motion. He exhibits no edema.  Neurological: He is alert and oriented to  person, place, and time. No cranial nerve deficit.       Patient is somewhat slow to speak. He seems mildly confused. Finger-nose intact bilaterally. Face is symmetric. Extraocular movements are intact. Pupils are equal round and reactive. Grip strength is equal bilaterally.  Skin: Skin is warm and dry.  Psychiatric: He has a normal mood and affect.    ED Course  Procedures (including critical care time)  Labs Reviewed  COMPREHENSIVE METABOLIC PANEL - Abnormal; Notable for the following:    GFR calc non Af Amer 90 (*)     All other components within normal limits  PROTIME-INR - Abnormal; Notable for the following:    Prothrombin Time 21.9 (*)     INR 1.88 (*)     All other components within normal limits  APTT - Abnormal; Notable for the following:    aPTT 38 (*)     All other components within normal limits  CBC WITH DIFFERENTIAL  ETHANOL  URINE RAPID DRUG SCREEN (HOSP PERFORMED)  URINALYSIS, ROUTINE W REFLEX MICROSCOPIC   Dg Chest 2 View  03/13/2012  *RADIOLOGY REPORT*  Clinical Data: Dizziness.  Altered mental status.  Previous pulmonary embolism.  CHEST - 2 VIEW  Comparison: None.  Findings: Linear opacities seen in the anterior lung base, consistent with mild scarring or atelectasis.  No evidence of pulmonary edema or airspace disease.  No evidence of pleural effusion.  Mild cardiomegaly is noted.  No mass or lymphadenopathy identified.  IMPRESSION:  1.  Mild basilar scarring versus atelectasis.  No evidence of pulmonary consolidation or edema. 2.  Mild cardiomegaly.  Original Report Authenticated By: Danae Orleans, M.D.   Ct Head Wo Contrast  03/13/2012  *RADIOLOGY REPORT*  Clinical Data: Altered mental status.  Confusion.  Memory loss. Headache.  Unsteady gait.  CT HEAD WITHOUT CONTRAST  Technique:  Contiguous axial images were obtained from the base of the skull through the vertex without contrast.  Comparison: None.  Findings: There is no evidence of intracranial hemorrhage,  brain edema or other signs of acute infarction.  There is no evidence of intracranial mass lesion or mass effect.  No abnormal extra-axial fluid collections are identified.  Mild cerebral atrophy is noted.  No evidence of hydrocephalus.  No skull abnormality identified.  IMPRESSION:  1.  No acute intracranial abnormality. 2.  Mild cerebral atrophy.  Original Report Authenticated By: Danae Orleans, M.D.     1. Altered mental status       MDM  Patient had difficulty speaking. He states his mind his been confused. CT and laboratory reassuring. Is a previous history of pulmonary embolisms. This worries me is in terms of him being hypercoagulable at a higher risk for stroke. He'll be admitted to medicine for further workup  Juliet Rude. Rubin Payor, MD 03/14/12 [phone]

## 2012-03-13 NOTE — ED Notes (Signed)
Pt is aware of the urine sample needed, urinal is at the bedside

## 2012-03-13 NOTE — ED Notes (Signed)
Patient and Patient's wife at bedside stated coworker witnessed patient intermittently having difficulty concentrated unsteady gait and have blank stare on face 3 weeks ago worsening overtime.  Today patient states "slow to get words out"  Clear speech answering and following commands appropriate.  Headache achy throbbing 1/10.

## 2012-03-14 ENCOUNTER — Observation Stay (HOSPITAL_COMMUNITY): Payer: BC Managed Care – PPO

## 2012-03-14 ENCOUNTER — Encounter (HOSPITAL_COMMUNITY): Payer: Self-pay | Admitting: *Deleted

## 2012-03-14 DIAGNOSIS — R404 Transient alteration of awareness: Secondary | ICD-10-CM

## 2012-03-14 DIAGNOSIS — R410 Disorientation, unspecified: Secondary | ICD-10-CM | POA: Diagnosis present

## 2012-03-14 DIAGNOSIS — R4182 Altered mental status, unspecified: Secondary | ICD-10-CM

## 2012-03-14 DIAGNOSIS — I2699 Other pulmonary embolism without acute cor pulmonale: Secondary | ICD-10-CM

## 2012-03-14 DIAGNOSIS — G459 Transient cerebral ischemic attack, unspecified: Secondary | ICD-10-CM

## 2012-03-14 DIAGNOSIS — E785 Hyperlipidemia, unspecified: Secondary | ICD-10-CM

## 2012-03-14 LAB — CBC
HCT: 43.3 % (ref 39.0–52.0)
Hemoglobin: 15.1 g/dL (ref 13.0–17.0)
MCH: 30.3 pg (ref 26.0–34.0)
MCHC: 34.9 g/dL (ref 30.0–36.0)
MCV: 86.8 fL (ref 78.0–100.0)
Platelets: 222 10*3/uL (ref 150–400)
RBC: 4.99 MIL/uL (ref 4.22–5.81)
RDW: 13 % (ref 11.5–15.5)
WBC: 8.2 10*3/uL (ref 4.0–10.5)

## 2012-03-14 LAB — LIPID PANEL
Cholesterol: 131 mg/dL (ref 0–200)
HDL: 35 mg/dL — ABNORMAL LOW (ref >39)
LDL Cholesterol: 74 mg/dL (ref 0–99)
Total CHOL/HDL Ratio: 3.7 RATIO
Triglycerides: 110 mg/dL (ref <150)
VLDL: 22 mg/dL (ref 0–40)

## 2012-03-14 LAB — CREATININE, SERUM
Creatinine, Ser: 0.87 mg/dL (ref 0.50–1.35)
GFR calc Af Amer: >90 mL/min (ref >90)
GFR calc non Af Amer: >90 mL/min (ref >90)

## 2012-03-14 LAB — PROTIME-INR
INR: 1.81 — ABNORMAL HIGH (ref 0.00–1.49)
Prothrombin Time: 21.3 seconds — ABNORMAL HIGH (ref 11.6–15.2)

## 2012-03-14 LAB — HEMOGLOBIN A1C
Hgb A1c MFr Bld: 5.4 % (ref <5.7)
Mean Plasma Glucose: 108 mg/dL (ref <117)

## 2012-03-14 MED ORDER — TRAZODONE HCL 50 MG PO TABS
50.0000 mg | ORAL_TABLET | Freq: Every evening | ORAL | Status: DC | PRN
  Filled 2012-03-14: qty 1

## 2012-03-14 MED ORDER — WARFARIN - PHYSICIAN DOSING INPATIENT: Freq: Every day | Status: DC

## 2012-03-14 MED ORDER — WARFARIN SODIUM 7.5 MG PO TABS
7.5000 mg | ORAL_TABLET | Freq: Every day | ORAL | Status: DC
  Administered 2012-03-14: 7.5 mg via ORAL
  Filled 2012-03-14 (×2): qty 1

## 2012-03-14 MED ORDER — LORAZEPAM 2 MG/ML IJ SOLN
INTRAMUSCULAR | Status: AC
  Filled 2012-03-14: qty 1

## 2012-03-14 MED ORDER — ATORVASTATIN CALCIUM 20 MG PO TABS
20.0000 mg | ORAL_TABLET | Freq: Every day | ORAL | Status: DC
  Administered 2012-03-14: 20 mg via ORAL
  Filled 2012-03-14 (×2): qty 1

## 2012-03-14 MED ORDER — WARFARIN SODIUM 10 MG PO TABS
10.0000 mg | ORAL_TABLET | ORAL | Status: AC
  Administered 2012-03-14: 10 mg via ORAL
  Filled 2012-03-14: qty 1

## 2012-03-14 MED ORDER — BUPROPION HCL ER (XL) 150 MG PO TB24
150.0000 mg | ORAL_TABLET | Freq: Every day | ORAL | Status: DC
  Administered 2012-03-14: 150 mg via ORAL
  Filled 2012-03-14 (×2): qty 1

## 2012-03-14 MED ORDER — LORAZEPAM 2 MG/ML IJ SOLN
0.5000 mg | Freq: Once | INTRAMUSCULAR | Status: AC | PRN
  Administered 2012-03-14: 0.5 mg via INTRAVENOUS

## 2012-03-14 MED ORDER — DARIFENACIN HYDROBROMIDE ER 7.5 MG PO TB24
7.5000 mg | ORAL_TABLET | Freq: Every day | ORAL | Status: DC
  Administered 2012-03-14: 7.5 mg via ORAL
  Filled 2012-03-14 (×2): qty 1

## 2012-03-14 MED ORDER — FLUTICASONE PROPIONATE 50 MCG/ACT NA SUSP
2.0000 | Freq: Every day | NASAL | Status: DC
  Administered 2012-03-14: 2 via NASAL
  Filled 2012-03-14: qty 16

## 2012-03-14 MED ORDER — ASPIRIN 325 MG PO TABS
325.0000 mg | ORAL_TABLET | Freq: Every day | ORAL | Status: DC
  Administered 2012-03-14: 325 mg via ORAL
  Filled 2012-03-14 (×2): qty 1

## 2012-03-14 NOTE — Progress Notes (Signed)
*  PRELIMINARY RESULTS* Vascular Ultrasound Carotid Duplex (Doppler) has been completed.  No evidence of internal carotid artery stenosis bilaterally. Bilateral antegrade vertebral artery flow.  03/14/2012 11:01 AM Gertie Fey, RDMS, RDCS

## 2012-03-14 NOTE — Progress Notes (Signed)
Subjective: Feeling better today.  He feels like his mentation is back to baseline.  No specific concerns.  Did note that over the last 2 weeks he has had significant stress at work.  Objective: Vital signs in last 24 hours: Filed Vitals:   03/14/12 0200 03/14/12 0533 03/14/12 0944 03/14/12 1026  BP: 132/69 135/80  143/79  Pulse: 66 73 72   Temp: 98.3 F (36.8 C) 97.6 F (36.4 C) 98.2 F (36.8 C)   TempSrc: Oral Oral Oral   Resp: 16 16  18   Height:      Weight:      SpO2: 97% 98% 97%    Weight change:   Intake/Output Summary (Last 24 hours) at 03/14/12 1213 Last data filed at 03/14/12 0600  Gross per 24 hour  Intake    480 ml  Output      0 ml  Net    480 ml    Physical Exam: General: Awake, Oriented, No acute distress. HEENT: EOMI. Neck: Supple CV: S1 and S2 Lungs: Clear to ascultation bilaterally Abdomen: Soft, Nontender, Nondistended, +bowel sounds. Ext: Good pulses. Trace edema.  Lab Results: Basic Metabolic Panel:  Lab 03/14/12 4540 03/13/12 1831  NA -- 138  K -- 3.9  CL -- 103  CO2 -- 26  GLUCOSE -- 90  BUN -- 18  CREATININE 0.87 0.97  CALCIUM -- 9.0  MG -- --  PHOS -- --   Liver Function Tests:  Lab 03/13/12 1831  AST 14  ALT 14  ALKPHOS 96  BILITOT 0.4  PROT 6.9  ALBUMIN 3.8   No results found for this basename: LIPASE:5,AMYLASE:5 in the last 168 hours No results found for this basename: AMMONIA:5 in the last 168 hours CBC:  Lab 03/14/12 0530 03/13/12 1831  WBC 8.2 7.1  NEUTROABS -- 3.9  HGB 15.1 14.8  HCT 43.3 42.2  MCV 86.8 86.5  PLT 222 227   Cardiac Enzymes: No results found for this basename: CKTOTAL:5,CKMB:5,CKMBINDEX:5,TROPONINI:5 in the last 168 hours BNP (last 3 results) No results found for this basename: PROBNP:3 in the last 8760 hours CBG: No results found for this basename: GLUCAP:5 in the last 168 hours No results found for this basename: HGBA1C:5 in the last 72 hours Other Labs: No components found with this  basename: POCBNP:3 No results found for this basename: DDIMER:2 in the last 168 hours  Lab 03/14/12 0530  CHOL 131  HDL 35*  LDLCALC 74  TRIG 981  CHOLHDL 3.7  LDLDIRECT --   No results found for this basename: TSH,T4TOTAL,FREET3,T3FREE,FREET4,THYROIDAB in the last 168 hours No results found for this basename: VITAMINB12:2,FOLATE:2,FERRITIN:2,TIBC:2,IRON:2,RETICCTPCT:2 in the last 168 hours  Micro Results: No results found for this or any previous visit (from the past 240 hour(s)).  Studies/Results: Dg Chest 2 View  03/13/2012  *RADIOLOGY REPORT*  Clinical Data: Dizziness.  Altered mental status.  Previous pulmonary embolism.  CHEST - 2 VIEW  Comparison: None.  Findings: Linear opacities seen in the anterior lung base, consistent with mild scarring or atelectasis.  No evidence of pulmonary edema or airspace disease.  No evidence of pleural effusion.  Mild cardiomegaly is noted.  No mass or lymphadenopathy identified.  IMPRESSION:  1.  Mild basilar scarring versus atelectasis.  No evidence of pulmonary consolidation or edema. 2.  Mild cardiomegaly.  Original Report Authenticated By: Danae Orleans, M.D.   Ct Head Wo Contrast  03/13/2012  *RADIOLOGY REPORT*  Clinical Data: Altered mental status.  Confusion.  Memory loss.  Headache.  Unsteady gait.  CT HEAD WITHOUT CONTRAST  Technique:  Contiguous axial images were obtained from the base of the skull through the vertex without contrast.  Comparison: None.  Findings: There is no evidence of intracranial hemorrhage, brain edema or other signs of acute infarction.  There is no evidence of intracranial mass lesion or mass effect.  No abnormal extra-axial fluid collections are identified.  Mild cerebral atrophy is noted.  No evidence of hydrocephalus.  No skull abnormality identified.  IMPRESSION:  1.  No acute intracranial abnormality. 2.  Mild cerebral atrophy.  Original Report Authenticated By: Danae Orleans, M.D.    Medications: I have reviewed  the patient's current medications. Scheduled Meds:   . aspirin  325 mg Oral Daily  . atorvastatin  20 mg Oral q1800  . buPROPion  150 mg Oral Daily  . darifenacin  7.5 mg Oral Daily  . fluticasone  2 spray Each Nare Daily  . warfarin  10 mg Oral NOW  . warfarin  7.5 mg Oral q1800  . Warfarin - Physician Dosing Inpatient   Does not apply q1800  . DISCONTD: Warfarin - Physician Dosing Inpatient   Does not apply q1800   Continuous Infusions:  PRN Meds:.traZODone  Assessment/Plan: Acute delirium/altered mental status/encephalopathy Etiology unclear.  Head CT negative.  And feels like he is close to his baseline.  MRI of the head, and echocardiogram pending. Carotid Dopplers performed on 03/14/2012 showed no evidence of internal carotid artery stenosis.  No events noted on telemetry.  History of pulmonary embolism Continue anticoagulation.  INR is subtherapeutic, Coumadin dosing per pharmacy.  Subtherapeutic INR Coumadin dosing per pharmacy.  Hyperlipidemia Continue statin.  Depression Continue bupropion.  Prophylaxis On anticoagulation.  Disposition If MRI is negative then consider discharge in the next 24 hours.  Change the patient from inpatient observation.   LOS: 1 day  Shaneika Rossa A, MD 03/14/2012, 12:13 PM

## 2012-03-14 NOTE — H&P (Signed)
PCP:   [name], MD   Chief Complaint:  Confusion  HPI: This is a 56 year old male with a history of pulmonary embolism, lipidemia who presents to the emergency department with 2 weeks of intermittent confusion, word finding difficulty, blurred vision. The patient is unable to specify along these episodes last.  There are no provoking or palliating factors to his knowledge. He does have a family history of cerebrovascular disease. He is currently on chronic Coumadin therapy due to a pulmonary embolism. Workup for her hypothyroid state is negative per the patient.  Review of Systems:  The patient denies anorexia, fever, weight loss,, vision loss, decreased hearing, hoarseness, chest pain, syncope, dyspnea on exertion, peripheral edema, balance deficits, hemoptysis, abdominal pain, melena, hematochezia, severe indigestion/heartburn, hematuria, incontinence, genital sores, muscle weakness, suspicious skin lesions, transient blindness, difficulty walking, depression, unusual weight change, abnormal bleeding, enlarged lymph nodes, angioedema, and breast masses.  Past Medical History: Past Medical History  Diagnosis Date  . Pulmonary embolism 10/2009  . Dyslipidemia   . Cystitis, acute   . Diverticulitis   . Lumbago   . Nephrolithiasis   . BPH (benign prostatic hypertrophy)   . Chest pain   . Dyspnea    Past Surgical History  Procedure Date  . Kidney stones     REMOVAL   . Knee arthroscopy     RIGHT KNEE  . Tonsillectomy     Medications: Prior to Admission medications   Medication Sig Start Date End Date Taking? Authorizing Provider  aspirin 81 MG tablet Take 81 mg by mouth daily.     Yes Historical Provider, MD  atorvastatin (LIPITOR) 20 MG tablet Take 1 tablet by mouth Daily. 01/18/12  Yes Historical Provider, MD  buPROPion (WELLBUTRIN XL) 150 MG 24 hr tablet Take 1 tablet by mouth Daily. 01/18/12  Yes Historical Provider, MD  fluticasone (FLONASE) 50 MCG/ACT nasal spray  Place 2 sprays into the nose daily as needed. For allergies   Yes Historical Provider, MD  solifenacin (VESICARE) 5 MG tablet Take 5 mg by mouth daily.     Yes Historical Provider, MD  warfarin (COUMADIN) 7.5 MG tablet Take 7.5 mg by mouth daily.   Yes Historical Provider, MD  traZODone (DESYREL) 50 MG tablet Take 50 mg by mouth as needed.      Historical Provider, MD    Allergies:   Allergies  Allergen Reactions  . Sulfa Antibiotics Rash    Social History:  reports that he quit smoking about 24 years ago. His smoking use included Cigarettes. He has a 18 pack-year smoking history. He does not have any smokeless tobacco history on file. He reports that he does not drink alcohol or use illicit drugs.  Family History: Family History  Problem Relation Age of Onset  . Heart attack Father 4  . Hyperlipidemia Father   . Diabetes Father   . Hypertension Father   . Fibromyalgia Sister   . Stroke Mother   . Arrhythmia Mother     atrial fib.    Physical Exam: Filed Vitals:   03/13/12 1852 03/13/12 1858 03/13/12 1937 03/13/12 2239  BP: 123/75  123/84 111/69  Pulse: 60  60 58  Temp:  97.4 F (36.3 C)  97.1 F (36.2 C)  TempSrc:    Oral  Resp: 16  18 16   SpO2: 98%  100% 98%   General appearance: alert, cooperative and no distress Head: Normocephalic, without obvious abnormality, atraumatic Eyes: conjunctivae/corneas clear. PERRL, EOM's intact. Fundi benign. Ears: normal  TM's and external ear canals both ears Throat: lips, mucosa, and tongue normal; teeth and gums normal Resp: clear to auscultation bilaterally Cardio: regular rate and rhythm, S1, S2 normal, no murmur, click, rub or gallop GI: soft, non-tender; bowel sounds normal; no masses,  no organomegaly Extremities: extremities normal, atraumatic, no cyanosis or edema Pulses: 2+ and symmetric Neurologic: Mental status: Alert, oriented, thought content appropriate Cranial nerves: normal Sensory: normal Motor: grossly  normal Reflexes: 2+ and symmetric Coordination: normal Mild word finding difficulty.   Labs on Admission:   [facility] 03/13/12 1831  NA 138  K 3.9  CL 103  CO2 26  GLUCOSE 90  BUN 18  CREATININE 0.97  CALCIUM 9.0  MG --  PHOS --    Basename 03/13/12 1831  AST 14  ALT 14  ALKPHOS 96  BILITOT 0.4  PROT 6.9  ALBUMIN 3.8   No results found for this basename: LIPASE:2,AMYLASE:2 in the last 72 hours  Basename 03/13/12 1831  WBC 7.1  NEUTROABS 3.9  HGB 14.8  HCT 42.2  MCV 86.5  PLT 227   No results found for this basename: CKTOTAL:3,CKMB:3,CKMBINDEX:3,TROPONINI:3 in the last 72 hours No results found for this basename: TSH,T4TOTAL,FREET3,T3FREE,THYROIDAB in the last 72 hours No results found for this basename: VITAMINB12:2,FOLATE:2,FERRITIN:2,TIBC:2,IRON:2,RETICCTPCT:2 in the last 72 hours  Radiological Exams on Admission: Dg Chest 2 View  03/13/2012  *RADIOLOGY REPORT*  Clinical Data: Dizziness.  Altered mental status.  Previous pulmonary embolism.  CHEST - 2 VIEW  Comparison: None.  Findings: Linear opacities seen in the anterior lung base, consistent with mild scarring or atelectasis.  No evidence of pulmonary edema or airspace disease.  No evidence of pleural effusion.  Mild cardiomegaly is noted.  No mass or lymphadenopathy identified.  IMPRESSION:  1.  Mild basilar scarring versus atelectasis.  No evidence of pulmonary consolidation or edema. 2.  Mild cardiomegaly.  Original Report Authenticated By: Danae Orleans, M.D.   Ct Head Wo Contrast  03/13/2012  *RADIOLOGY REPORT*  Clinical Data: Altered mental status.  Confusion.  Memory loss. Headache.  Unsteady gait.  CT HEAD WITHOUT CONTRAST  Technique:  Contiguous axial images were obtained from the base of the skull through the vertex without contrast.  Comparison: None.  Findings: There is no evidence of intracranial hemorrhage, brain edema or other signs of acute infarction.  There is no evidence of intracranial mass  lesion or mass effect.  No abnormal extra-axial fluid collections are identified.  Mild cerebral atrophy is noted.  No evidence of hydrocephalus.  No skull abnormality identified.  IMPRESSION:  1.  No acute intracranial abnormality. 2.  Mild cerebral atrophy.  Original Report Authenticated By: Danae Orleans, M.D.    Assessment/Plan Present on Admission:  .Altered mental status  Due to history of PE and family history for stroke I will admit the patient and do a TIA workup. Patient to be admitted to telemetry. Will order MRI in the morning, along with echocardiogram and carotid Dopplers. The patient may need neurologic consult for finding difficulty continues despite negative workup. We'll continue with Coumadin therapy. Patient is full code.  STINSON, JACOB JEHIEL 03/14/2012, 12:53 AM

## 2012-03-15 DIAGNOSIS — I2699 Other pulmonary embolism without acute cor pulmonale: Secondary | ICD-10-CM

## 2012-03-15 DIAGNOSIS — E785 Hyperlipidemia, unspecified: Secondary | ICD-10-CM

## 2012-03-15 DIAGNOSIS — R404 Transient alteration of awareness: Principal | ICD-10-CM

## 2012-03-15 LAB — PROTIME-INR
INR: 2.1 — ABNORMAL HIGH (ref 0.00–1.49)
Prothrombin Time: 23.9 seconds — ABNORMAL HIGH (ref 11.6–15.2)

## 2012-03-15 MED ORDER — UNABLE TO FIND: Status: DC

## 2012-03-15 NOTE — Discharge Summary (Signed)
Physician Discharge Summary  Sean Winters:[id number] DOB: Apr 03, 1956 DOA: 03/13/2012  PCP: [name], MD  Admit date: 03/13/2012 Discharge date: 03/15/2012  Recommendations for Outpatient Follow-up:  1. Followup with your primary care physician in 1 week.  Discharge Diagnoses:  Principal Problem:  *Acute delirium Active Problems:  HYPERLIPIDEMIA  PULMONARY EMBOLISM  Discharge Condition: Stable  Diet recommendation: Heart healthy diet  History of present illness:  56 year old male presented with confusion, has a history of pulmonary embolism, hyperlipidemia.  Patient had 2 week intermittent confusion, wart finding difficulty, and blurred vision.  Patient was hospitalized on 03/14/2012.  Hospital Course:  Acute delirium/altered mental status/encephalopathy Etiology unclear, patient indicated that he has had significant stress at work over the last 2 weeks.  Suspect patient's confusion/altered mental status is likely stress related.  Head CT negative.  Mentation was baseline during the hospital stay. 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 Carotid Dopplers performed on 03/14/2012 showed no evidence of internal carotid artery stenosis.  No events noted on telemetry.  Patient was instructed to follow up with his primary care physician in 1 week for further care and management.  History of pulmonary embolism Continue anticoagulation.  INR is therapeutic today, Coumadin dosing per pharmacy.  Subtherapeutic INR Resolved, Coumadin per pharmacy.  Hyperlipidemia Continue statin.  Depression Continue bupropion.  Procedures:  MRI/MRA of the brain on 03/14/2012, carotid Dopplers performed on 03/14/2012  Consultations:  None  Discharge Exam: Filed Vitals:   03/15/12 0631  BP: 130/75  Pulse: 66  Temp: 98.6 F (37 C)  Resp: 20   Filed Vitals:   03/14/12 1801 03/14/12 2145 03/15/12  0200 03/15/12 0631  BP: 134/75 126/24 132/74 130/75  Pulse: 87 86 72 66  Temp: 97.8 F (36.6 C) 98.1 F (36.7 C) 98 F (36.7 C) 98.6 F (37 C)  TempSrc: Oral Oral Oral Oral  Resp: 19 19 20 20   Height:      Weight:      SpO2: 98% 95% 96% 97%   Discharge Instructions  Discharge Orders    Future Orders Please Complete By Expires   Diet - low sodium heart healthy      Increase activity slowly      Discharge instructions      Comments:   Followup with [name], MD (PCP) in 1 week.     Medication List  As of 03/15/2012  8:06 AM   TAKE these medications         aspirin 81 MG tablet   Take 81 mg by mouth daily.      atorvastatin 20 MG tablet   Commonly known as: LIPITOR   Take 1 tablet by mouth Daily.      buPROPion 150 MG 24 hr tablet   Commonly known as: WELLBUTRIN XL   Take 1 tablet by mouth Daily.      fluticasone 50 MCG/ACT nasal spray   Commonly known as: FLONASE   Place 2 sprays into the nose daily as needed. For allergies      solifenacin 5 MG tablet   Commonly known as: VESICARE   Take 5 mg by mouth daily.      traZODone 50 MG tablet   Commonly known as: DESYREL   Take 50 mg by mouth as needed.      UNABLE TO FIND   Please excuse the patient from work obligations from 03/13/2012 till 03/17/2012.  Patient to return to work  on 03/18/2012 without any work restrictions      warfarin 7.5 MG tablet   Commonly known as: COUMADIN   Take 7.5 mg by mouth daily.              The results of significant diagnostics from this hospitalization (including imaging, microbiology, ancillary and laboratory) are listed below for reference.    Significant Diagnostic Studies: Dg Chest 2 View  03/13/2012  *RADIOLOGY REPORT*  Clinical Data: Dizziness.  Altered mental status.  Previous pulmonary embolism.  CHEST - 2 VIEW  Comparison: None.  Findings: Linear opacities seen in the anterior lung base, consistent with mild scarring or atelectasis.  No evidence of  pulmonary edema or airspace disease.  No evidence of pleural effusion.  Mild cardiomegaly is noted.  No mass or lymphadenopathy identified.  IMPRESSION:  1.  Mild basilar scarring versus atelectasis.  No evidence of pulmonary consolidation or edema. 2.  Mild cardiomegaly.  Original Report Authenticated By: Danae Orleans, M.D.   Ct Head Wo Contrast  03/13/2012  *RADIOLOGY REPORT*  Clinical Data: Altered mental status.  Confusion.  Memory loss. Headache.  Unsteady gait.  CT HEAD WITHOUT CONTRAST  Technique:  Contiguous axial images were obtained from the base of the skull through the vertex without contrast.  Comparison: None.  Findings: There is no evidence of intracranial hemorrhage, brain edema or other signs of acute infarction.  There is no evidence of intracranial mass lesion or mass effect.  No abnormal extra-axial fluid collections are identified.  Mild cerebral atrophy is noted.  No evidence of hydrocephalus.  No skull abnormality identified.  IMPRESSION:  1.  No acute intracranial abnormality. 2.  Mild cerebral atrophy.  Original Report Authenticated By: Danae Orleans, M.D.   Mri Brain Without Contrast  03/14/2012  *RADIOLOGY REPORT*  Clinical Data:  Intermittent confusion with word finding difficulty and blurred vision.  MRI HEAD WITHOUT CONTRAST MRA HEAD WITHOUT CONTRAST  Technique:  Multiplanar, multiecho pulse sequences of the brain and surrounding structures were obtained without intravenous contrast. Angiographic images of the head were obtained using MRA technique without contrast.  Comparison:  CT head 03/13/2012  MRI HEAD  Findings:  There is no evidence for acute infarction, intracranial hemorrhage, mass lesion, or hydrocephalus.  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   Slight premature atrophy.  No significant white matter disease.  Negative calvarium and skull base. Pituitary and cerebellar tonsils unremarkable.   Compared to prior CT, the appearance is similar.  IMPRESSION: No acute intracranial abnormality.  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  MRA HEAD  Findings: The internal carotid arteries are widely patent.  The basilar artery is widely patent with left greater than right vertebral arteries contributing.  No intracranial stenosis or aneurysm.  IMPRESSION: Negative.  Original Report Authenticated By: Elsie Stain, M.D.   Mr Mra Head/brain Wo Cm  03/14/2012  *RADIOLOGY REPORT*  Clinical Data:  Intermittent confusion with word finding difficulty and blurred vision.  MRI HEAD WITHOUT CONTRAST MRA HEAD WITHOUT CONTRAST  Technique:  Multiplanar, multiecho pulse sequences of the brain and surrounding structures were obtained without intravenous contrast. Angiographic images of the head were obtained using MRA technique without contrast.  Comparison:  CT head 03/13/2012  MRI HEAD  Findings:  There is no evidence for acute infarction, intracranial hemorrhage, mass lesion, or hydrocephalus.  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  Slight premature atrophy.  No significant white matter disease.  Negative calvarium and skull base. Pituitary and cerebellar tonsils unremarkable.  Compared to prior CT, the appearance is similar.  IMPRESSION: No acute intracranial abnormality.  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  MRA HEAD  Findings: The internal carotid arteries are widely patent.  The basilar artery is widely patent with left greater than right vertebral arteries contributing.  No intracranial stenosis or aneurysm.  IMPRESSION: Negative.  Original Report Authenticated By: Elsie Stain, M.D.    Microbiology: No results found for this or any previous visit (from the past 240 hour(s)).   Labs: Basic Metabolic Panel:  Lab 03/14/12 5784 03/13/12 1831  NA -- 138  K -- 3.9  CL -- 103  CO2 -- 26    GLUCOSE -- 90  BUN -- 18  CREATININE 0.87 0.97  CALCIUM -- 9.0  MG -- --  PHOS -- --   Liver Function Tests:  Lab 03/13/12 1831  AST 14  ALT 14  ALKPHOS 96  BILITOT 0.4  PROT 6.9  ALBUMIN 3.8   No results found for this basename: LIPASE:5,AMYLASE:5 in the last 168 hours No results found for this basename: AMMONIA:5 in the last 168 hours CBC:  Lab 03/14/12 0530 03/13/12 1831  WBC 8.2 7.1  NEUTROABS -- 3.9  HGB 15.1 14.8  HCT 43.3 42.2  MCV 86.8 86.5  PLT 222 227   Cardiac Enzymes: No results found for this basename: CKTOTAL:5,CKMB:5,CKMBINDEX:5,TROPONINI:5 in the last 168 hours BNP: BNP (last 3 results) No results found for this basename: PROBNP:3 in the last 8760 hours CBG: No results found for this basename: GLUCAP:5 in the last 168 hours  Time coordinating discharge: 25  Signed:  Verdia Bolt A  [facility] 03/15/2012, 8:06 AM

## 2012-03-15 NOTE — Progress Notes (Signed)
Subjective: Continues to feel better. No specific complaints.   Objective: Vital signs in last 24 hours: Filed Vitals:   03/14/12 1801 03/14/12 2145 03/15/12 0200 03/15/12 0631  BP: 134/75 126/24 132/74 130/75  Pulse: 87 86 72 66  Temp: 97.8 F (36.6 C) 98.1 F (36.7 C) 98 F (36.7 C) 98.6 F (37 C)  TempSrc: Oral Oral Oral Oral  Resp: 19 19 20 20   Height:      Weight:      SpO2: 98% 95% 96% 97%   Weight change:  No intake or output data in the 24 hours ending 03/15/12 0752  Physical Exam: General: Awake, Oriented, No acute distress. HEENT: EOMI. Neck: Supple CV: S1 and S2 Lungs: Clear to ascultation bilaterally Abdomen: Soft, Nontender, Nondistended, +bowel sounds. Ext: Good pulses. Trace edema.  Lab Results: Basic Metabolic Panel:  Lab 03/14/12 2841 03/13/12 1831  NA -- 138  K -- 3.9  CL -- 103  CO2 -- 26  GLUCOSE -- 90  BUN -- 18  CREATININE 0.87 0.97  CALCIUM -- 9.0  MG -- --  PHOS -- --   Liver Function Tests:  Lab 03/13/12 1831  AST 14  ALT 14  ALKPHOS 96  BILITOT 0.4  PROT 6.9  ALBUMIN 3.8   No results found for this basename: LIPASE:5,AMYLASE:5 in the last 168 hours No results found for this basename: AMMONIA:5 in the last 168 hours CBC:  Lab 03/14/12 0530 03/13/12 1831  WBC 8.2 7.1  NEUTROABS -- 3.9  HGB 15.1 14.8  HCT 43.3 42.2  MCV 86.8 86.5  PLT 222 227   Cardiac Enzymes: No results found for this basename: CKTOTAL:5,CKMB:5,CKMBINDEX:5,TROPONINI:5 in the last 168 hours BNP (last 3 results) No results found for this basename: PROBNP:3 in the last 8760 hours CBG: No results found for this basename: GLUCAP:5 in the last 168 hours  Basename 03/14/12 0530  HGBA1C 5.4   Other Labs: No components found with this basename: POCBNP:3 No results found for this basename: DDIMER:2 in the last 168 hours  Lab 03/14/12 0530  CHOL 131  HDL 35*  LDLCALC 74  TRIG 324  CHOLHDL 3.7  LDLDIRECT --   No results found for this basename:  TSH,T4TOTAL,FREET3,T3FREE,FREET4,THYROIDAB in the last 168 hours No results found for this basename: VITAMINB12:2,FOLATE:2,FERRITIN:2,TIBC:2,IRON:2,RETICCTPCT:2 in the last 168 hours  Micro Results: No results found for this or any previous visit (from the past 240 hour(s)).  Studies/Results: Dg Chest 2 View  03/13/2012  *RADIOLOGY REPORT*  Clinical Data: Dizziness.  Altered mental status.  Previous pulmonary embolism.  CHEST - 2 VIEW  Comparison: None.  Findings: Linear opacities seen in the anterior lung base, consistent with mild scarring or atelectasis.  No evidence of pulmonary edema or airspace disease.  No evidence of pleural effusion.  Mild cardiomegaly is noted.  No mass or lymphadenopathy identified.  IMPRESSION:  1.  Mild basilar scarring versus atelectasis.  No evidence of pulmonary consolidation or edema. 2.  Mild cardiomegaly.  Original Report Authenticated By: Danae Orleans, M.D.   Ct Head Wo Contrast  03/13/2012  *RADIOLOGY REPORT*  Clinical Data: Altered mental status.  Confusion.  Memory loss. Headache.  Unsteady gait.  CT HEAD WITHOUT CONTRAST  Technique:  Contiguous axial images were obtained from the base of the skull through the vertex without contrast.  Comparison: None.  Findings: There is no evidence of intracranial hemorrhage, brain edema or other signs of acute infarction.  There is no evidence of intracranial mass lesion or  mass effect.  No abnormal extra-axial fluid collections are identified.  Mild cerebral atrophy is noted.  No evidence of hydrocephalus.  No skull abnormality identified.  IMPRESSION:  1.  No acute intracranial abnormality. 2.  Mild cerebral atrophy.  Original Report Authenticated By: Danae Orleans, M.D.   Mri Brain Without Contrast  03/14/2012  *RADIOLOGY REPORT*  Clinical Data:  Intermittent confusion with word finding difficulty and blurred vision.  MRI HEAD WITHOUT CONTRAST MRA HEAD WITHOUT CONTRAST  Technique:  Multiplanar, multiecho pulse sequences  of the brain and surrounding structures were obtained without intravenous contrast. Angiographic images of the head were obtained using MRA technique without contrast.  Comparison:  CT head 03/13/2012  MRI HEAD  Findings:  There is no evidence for acute infarction, intracranial hemorrhage, mass lesion, or hydrocephalus.  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   Slight premature atrophy.  No significant white matter disease.  Negative calvarium and skull base. Pituitary and cerebellar tonsils unremarkable.  Compared to prior CT, the appearance is similar.  IMPRESSION: No acute intracranial abnormality.  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  MRA HEAD  Findings: The internal carotid arteries are widely patent.  The basilar artery is widely patent with left greater than right vertebral arteries contributing.  No intracranial stenosis or aneurysm.  IMPRESSION: Negative.  Original Report Authenticated By: Elsie Stain, M.D.   Mr Mra Head/brain Wo Cm  03/14/2012  *RADIOLOGY REPORT*  Clinical Data:  Intermittent confusion with word finding difficulty and blurred vision.  MRI HEAD WITHOUT CONTRAST MRA HEAD WITHOUT CONTRAST  Technique:  Multiplanar, multiecho pulse sequences of the brain and surrounding structures were obtained without intravenous contrast. Angiographic images of the head were obtained using MRA technique without contrast.  Comparison:  CT head 03/13/2012  MRI HEAD  Findings:  There is no evidence for acute infarction, intracranial hemorrhage, mass lesion, or hydrocephalus.  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   Slight premature atrophy.  No significant white matter disease.  Negative calvarium and skull base. Pituitary and cerebellar tonsils unremarkable.  Compared to prior CT, the appearance is similar.  IMPRESSION: No acute  intracranial abnormality.  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  MRA HEAD  Findings: The internal carotid arteries are widely patent.  The basilar artery is widely patent with left greater than right vertebral arteries contributing.  No intracranial stenosis or aneurysm.  IMPRESSION: Negative.  Original Report Authenticated By: Elsie Stain, M.D.    Medications: I have reviewed the patient's current medications. Scheduled Meds:    . aspirin  325 mg Oral Daily  . atorvastatin  20 mg Oral q1800  . buPROPion  150 mg Oral Daily  . darifenacin  7.5 mg Oral Daily  . fluticasone  2 spray Each Nare Daily  . LORazepam      . warfarin  7.5 mg Oral q1800  . Warfarin - Physician Dosing Inpatient   Does not apply q1800   Continuous Infusions:  PRN Meds:.LORazepam, traZODone  Assessment/Plan: Acute delirium/altered mental status/encephalopathy Etiology unclear.  Head CT negative.  He feels like he is at baseline. 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 Carotid Dopplers performed on 03/14/2012 showed no evidence of internal carotid artery stenosis.  No events noted on telemetry.  History of pulmonary embolism Continue anticoagulation.  INR is  therapeutic today, Coumadin dosing per pharmacy.  Subtherapeutic INR Resolved, Coumadin per pharmacy.  Hyperlipidemia Continue statin.  Depression Continue bupropion.  Prophylaxis On anticoagulation.  Disposition Discharge the patient today.   LOS: 2 days  Lonney Revak A, MD 03/15/2012, 7:52 AM

## 2012-03-16 LAB — GLUCOSE, CAPILLARY: Glucose-Capillary: 96 mg/dL (ref 70–99)

## 2012-04-11 ENCOUNTER — Emergency Department (HOSPITAL_COMMUNITY): Payer: BC Managed Care – PPO

## 2012-04-11 ENCOUNTER — Encounter (HOSPITAL_COMMUNITY): Payer: Self-pay | Admitting: *Deleted

## 2012-04-11 ENCOUNTER — Emergency Department (HOSPITAL_COMMUNITY)
Admission: EM | Admit: 2012-04-11 | Discharge: 2012-04-13 | Disposition: A | Discharge status: Other Acute Inpatient Hospital | Payer: BC Managed Care – PPO | Attending: Emergency Medicine | Admitting: Emergency Medicine

## 2012-04-11 DIAGNOSIS — Z87891 Personal history of nicotine dependence: Secondary | ICD-10-CM | POA: Insufficient documentation

## 2012-04-11 DIAGNOSIS — Z7982 Long term (current) use of aspirin: Secondary | ICD-10-CM | POA: Insufficient documentation

## 2012-04-11 DIAGNOSIS — Z7901 Long term (current) use of anticoagulants: Secondary | ICD-10-CM | POA: Insufficient documentation

## 2012-04-11 DIAGNOSIS — F329 Major depressive disorder, single episode, unspecified: Secondary | ICD-10-CM | POA: Insufficient documentation

## 2012-04-11 DIAGNOSIS — Z79899 Other long term (current) drug therapy: Secondary | ICD-10-CM | POA: Insufficient documentation

## 2012-04-11 DIAGNOSIS — F22 Delusional disorders: Secondary | ICD-10-CM | POA: Insufficient documentation

## 2012-04-11 DIAGNOSIS — F3289 Other specified depressive episodes: Secondary | ICD-10-CM | POA: Insufficient documentation

## 2012-04-11 DIAGNOSIS — R4182 Altered mental status, unspecified: Secondary | ICD-10-CM | POA: Insufficient documentation

## 2012-04-11 DIAGNOSIS — E785 Hyperlipidemia, unspecified: Secondary | ICD-10-CM | POA: Insufficient documentation

## 2012-04-11 LAB — CBC WITH DIFFERENTIAL/PLATELET
Basophils Absolute: 0 10*3/uL (ref 0.0–0.1)
Basophils Relative: 1 % (ref 0–1)
Eosinophils Absolute: 0.2 10*3/uL (ref 0.0–0.7)
Eosinophils Relative: 2 % (ref 0–5)
HCT: 42.8 % (ref 39.0–52.0)
Hemoglobin: 14.6 g/dL (ref 13.0–17.0)
Lymphocytes Relative: 31 % (ref 12–46)
Lymphs Abs: 2.3 10*3/uL (ref 0.7–4.0)
MCH: 29.7 pg (ref 26.0–34.0)
MCHC: 34.1 g/dL (ref 30.0–36.0)
MCV: 87.2 fL (ref 78.0–100.0)
Monocytes Absolute: 0.8 10*3/uL (ref 0.1–1.0)
Monocytes Relative: 11 % (ref 3–12)
Neutro Abs: 4.1 10*3/uL (ref 1.7–7.7)
Neutrophils Relative %: 56 % (ref 43–77)
Platelets: 220 10*3/uL (ref 150–400)
RBC: 4.91 MIL/uL (ref 4.22–5.81)
RDW: 13.4 % (ref 11.5–15.5)
WBC: 7.4 10*3/uL (ref 4.0–10.5)

## 2012-04-11 LAB — RAPID URINE DRUG SCREEN, HOSP PERFORMED
Amphetamines: NOT DETECTED
Barbiturates: NOT DETECTED
Benzodiazepines: POSITIVE — AB
Cocaine: NOT DETECTED
Opiates: NOT DETECTED
Tetrahydrocannabinol: NOT DETECTED

## 2012-04-11 LAB — URINALYSIS, ROUTINE W REFLEX MICROSCOPIC
Bilirubin Urine: NEGATIVE
Glucose, UA: NEGATIVE mg/dL
Hgb urine dipstick: NEGATIVE
Ketones, ur: NEGATIVE mg/dL
Leukocytes, UA: NEGATIVE
Nitrite: NEGATIVE
Protein, ur: NEGATIVE mg/dL
Specific Gravity, Urine: 1.017 (ref 1.005–1.030)
Urobilinogen, UA: 1 mg/dL (ref 0.0–1.0)
pH: 6 (ref 5.0–8.0)

## 2012-04-11 LAB — COMPREHENSIVE METABOLIC PANEL
ALT: 12 U/L (ref 0–53)
AST: 14 U/L (ref 0–37)
Albumin: 3.8 g/dL (ref 3.5–5.2)
Alkaline Phosphatase: 107 U/L (ref 39–117)
BUN: 17 mg/dL (ref 6–23)
CO2: 26 mEq/L (ref 19–32)
Calcium: 9.2 mg/dL (ref 8.4–10.5)
Chloride: 105 mEq/L (ref 96–112)
Creatinine, Ser: 1.05 mg/dL (ref 0.50–1.35)
GFR calc Af Amer: 89 mL/min — ABNORMAL LOW (ref >90)
GFR calc non Af Amer: 76 mL/min — ABNORMAL LOW (ref >90)
Glucose, Bld: 103 mg/dL — ABNORMAL HIGH (ref 70–99)
Potassium: 3.8 mEq/L (ref 3.5–5.1)
Sodium: 140 mEq/L (ref 135–145)
Total Bilirubin: 0.6 mg/dL (ref 0.3–1.2)
Total Protein: 6.8 g/dL (ref 6.0–8.3)

## 2012-04-11 LAB — PROTIME-INR
INR: 1.36 (ref 0.00–1.49)
Prothrombin Time: 17 seconds — ABNORMAL HIGH (ref 11.6–15.2)

## 2012-04-11 LAB — ETHANOL: Alcohol, Ethyl (B): <11 mg/dL (ref 0–11)

## 2012-04-11 MED ORDER — ASPIRIN EC 81 MG PO TBEC
81.0000 mg | DELAYED_RELEASE_TABLET | Freq: Every day | ORAL | Status: DC
  Administered 2012-04-11 – 2012-04-13 (×3): 81 mg via ORAL
  Filled 2012-04-11 (×4): qty 1

## 2012-04-11 MED ORDER — ONDANSETRON HCL 8 MG PO TABS: 4.0000 mg | ORAL_TABLET | Freq: Three times a day (TID) | ORAL | Status: DC | PRN

## 2012-04-11 MED ORDER — ASPIRIN 81 MG PO CHEW
81.0000 mg | CHEWABLE_TABLET | Freq: Every day | ORAL | Status: DC
  Administered 2012-04-11: 81 mg via ORAL
  Filled 2012-04-11: qty 1

## 2012-04-11 MED ORDER — ASPIRIN 81 MG PO TABS: 81.0000 mg | ORAL_TABLET | Freq: Every day | ORAL | Status: DC

## 2012-04-11 MED ORDER — WARFARIN SODIUM 7.5 MG PO TABS
7.5000 mg | ORAL_TABLET | Freq: Every day | ORAL | Status: DC
  Administered 2012-04-11 – 2012-04-12 (×2): 7.5 mg via ORAL
  Filled 2012-04-11 (×3): qty 1

## 2012-04-11 MED ORDER — BUPROPION HCL ER (SR) 100 MG PO TB12
100.0000 mg | ORAL_TABLET | Freq: Every day | ORAL | Status: DC
  Administered 2012-04-11 – 2012-04-12 (×2): 100 mg via ORAL
  Filled 2012-04-11 (×2): qty 1

## 2012-04-11 MED ORDER — LORAZEPAM 1 MG PO TABS: 1.0000 mg | ORAL_TABLET | Freq: Three times a day (TID) | ORAL | Status: DC | PRN

## 2012-04-11 MED ORDER — HALOPERIDOL LACTATE 5 MG/ML IJ SOLN
INTRAMUSCULAR | Status: AC
  Administered 2012-04-11: 5 mg
  Filled 2012-04-11: qty 1

## 2012-04-11 MED ORDER — ACETAMINOPHEN 325 MG PO TABS: 650.0000 mg | ORAL_TABLET | ORAL | Status: DC | PRN

## 2012-04-11 MED ORDER — RISPERIDONE 0.5 MG PO TABS
0.5000 mg | ORAL_TABLET | Freq: Once | ORAL | Status: AC
  Administered 2012-04-11: 0.5 mg via ORAL
  Filled 2012-04-11: qty 1

## 2012-04-11 MED ORDER — TRAZODONE HCL 100 MG PO TABS
100.0000 mg | ORAL_TABLET | Freq: Every day | ORAL | Status: DC
  Administered 2012-04-11 – 2012-04-12 (×2): 100 mg via ORAL
  Filled 2012-04-11 (×2): qty 1

## 2012-04-11 MED ORDER — WARFARIN - PHYSICIAN DOSING INPATIENT: Freq: Every day | Status: DC

## 2012-04-11 MED ORDER — DARIFENACIN HYDROBROMIDE ER 7.5 MG PO TB24
7.5000 mg | ORAL_TABLET | Freq: Every day | ORAL | Status: DC
  Administered 2012-04-11 – 2012-04-13 (×3): 7.5 mg via ORAL
  Filled 2012-04-11 (×4): qty 1

## 2012-04-11 MED ORDER — BUPROPION HCL ER (XL) 150 MG PO TB24
150.0000 mg | ORAL_TABLET | Freq: Every day | ORAL | Status: DC
  Administered 2012-04-11: 150 mg via ORAL
  Filled 2012-04-11 (×2): qty 1

## 2012-04-11 MED ORDER — ATORVASTATIN CALCIUM 20 MG PO TABS
20.0000 mg | ORAL_TABLET | Freq: Every day | ORAL | Status: DC
  Administered 2012-04-11 – 2012-04-12 (×2): 20 mg via ORAL
  Filled 2012-04-11 (×3): qty 1

## 2012-04-11 NOTE — ED Notes (Signed)
Patient is resting comfortably. 

## 2012-04-11 NOTE — ED Notes (Addendum)
Here with wife. Wife is fearful for her safety and her sons (58 y/o) safety. Reports pts is increasingly more aggitated, stressed, frustrated, confused, repetitive & volatile. Wife opted to bring him to ED rather than go the IVC route with the magistrate. H/o similar with work up for CVA, sx seem to be increasing, pt of Dr. Benedetto Goad, has recently had med changes and anti depresants added. Has been placed on administrative leave at work. Pt does not seem to be completely agreeable to the reason he is here. Denies any sx or recent illness. States, "I am fine".

## 2012-04-11 NOTE — ED Notes (Signed)
Wife's contact information for psych health history. Christine [phone] (cell)
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
## 2012-04-11 NOTE — ED Notes (Signed)
Dinner ordered@1600 

## 2012-04-11 NOTE — ED Notes (Signed)
CT uneventful.

## 2012-04-11 NOTE — ED Notes (Signed)
IVC papers received from [employer] and placed on clipbd.

## 2012-04-11 NOTE — [facility] Counselor (Signed)
Pt submitted for admission to [facility]. Consulted with Jacquelyne Balint, [facility] who confirmed there is not an appropriate bed available. Jorje Guild, PA reviewed clinical information and recommended assessment counselor continue to try to place Pt at another facility. If unable to place then re-run Pt at [facility] when an appropriate bed is available. Communicated this recommendation to Beatriz Stallion, ACT counselor at [facility].  Harlin Rain Patsy [place], LPC
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
## 2012-04-11 NOTE — ED Notes (Signed)
Tele psych report received.  This rn delivered to attending doc.

## 2012-04-11 NOTE — ED Notes (Signed)
Pt very aggitated, attempting to leave. [employer] at bedside.
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
## 2012-04-12 NOTE — ED Notes (Signed)
PASSWORD FOR VISITORS        NASSAU

## 2012-04-12 NOTE — ED Notes (Signed)
Patient wife arrived and is visiting

## 2012-04-12 NOTE — ED Provider Notes (Signed)
Patient had initially presented with aggressive behavior who is altered. He was initially seen by telepsych who recommended inpatient admission in and changed his medication regime. Today patient's is feeling much better and states his mood is more stable. No current change in therapy today however if no placement found for the patient then maybe repeat Telepysch tomorrow.  Gwyneth Sprout, MD 04/12/12 [phone]

## 2012-04-12 NOTE — ED Notes (Signed)
Patient on phone with wife, calm and cooperative

## 2012-04-13 NOTE — ED Notes (Signed)
Error in documentation @2111 . Pt did not urinate on the floor, he has stayed in bed resting

## 2012-04-13 NOTE — ED Notes (Signed)
Patient given washcloths, towels, shampoo and body bath, deodorant, toothpaste, toothbrush in order to take a shower. Also given scrubs and socks to change into after shower.

## 2012-04-13 NOTE — ED Notes (Signed)
Patient has been accepted at [facility] and [facility] called and they will pick patient up after 1500 today.  EDP advised.

## 2012-04-13 NOTE — BH Assessment (Signed)
Assessment Note  Update: Received a call from [facility] @ [phone] stating pt still on [facility] wait list. Then, received a call from [facility] stating pt accepted to Dr. Merlene Morse at [facility] and that pt's bed was ready and he could be transported. The number to call report that was given was [phone]. Updated EDP Capor[place]i and ED staff. ED staff to arrange transport via [place], as pt is IVC. Updated assessment, assessment disposition and faxed to [facility] to log.      Disposition:  Disposition Disposition of Patient: Inpatient treatment program Type of inpatient treatment program: Adult Patient referred to: Other (Comment) (Pt accepted [facility])  On Site Evaluation by:   Reviewed with Physician:  Marchelle Folks 04/13/2012 11:21 AM

## 2013-01-29 ENCOUNTER — Encounter: Payer: Self-pay | Admitting: Internal Medicine

## 2013-01-29 ENCOUNTER — Ambulatory Visit (INDEPENDENT_AMBULATORY_CARE_PROVIDER_SITE_OTHER): Payer: BC Managed Care – PPO | Admitting: Internal Medicine

## 2013-01-29 VITALS — BP 120/82 | HR 72 | Temp 98.1°F | Ht 68.0 in | Wt 226.8 lb

## 2013-01-29 DIAGNOSIS — I2699 Other pulmonary embolism without acute cor pulmonale: Secondary | ICD-10-CM

## 2013-01-29 MED ORDER — RIVAROXABAN 10 MG PO TABS: 10.0000 mg | ORAL_TABLET | Freq: Every day | ORAL | Status: DC

## 2013-01-29 NOTE — Progress Notes (Signed)
Subjective:    Patient ID: Sean Winters, male    DOB: 10-06-1955, 57 y.o.   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   -  FU CT for chest pain dec 2011: negative for PE.   - STress cardioliite 2012: negative for CAD   - Coumadin for INR > 2 through October 2012 - Coumadin for INR > 1.07 June 2011 onwards    OV 01/22/2012  Last seen 06/28/11. Followup for above. He has now completed 2 years coumadin Rx (last 6 months with INR goal 1.5 - 2). Denies any issues. Coumadin or blood draws not impacting quality of life. Denies falls or trauma. Past 6 weeks depressed and SSRI started and with this mood is better. He wonders if mood imblance is due to coumadin. Otherwise, he is wiling to continue with coumadin Rx. No otherr complaints. Lost weight intentially with diet and exercise but now gained some of it back after a cruise  Past, Family, Social reviewed: no change since last visit   Body mass index is 35.03 kg/(m^2).  REC As discussed we have agreed you will continue coumadin for goal INR 1.5 - 2 for one more year which will be your 3rd year on coumadin  Return in 1 year or sooner if needed  Any bleeding or any issues related to coumadin please let me know asap   OV 01/29/2013 Followup pulmonary embolism. He is now finished 3 years and 3 months of anticoagulations. The last year and a half or so has been with an INR goal of greater than 1.5. He is doing well his. His loss of weight. He denies any chest pain, shortness of breath, hemoptysis, edema, dizziness, palpitations. There have been no other pulmonary embolism episodes. He and his primary care physician extremely interested in switching over to oral xarelto due to convenience. I offered him the option of stopping antiplatelet and all together and discontinuing aspirin but he strongly feels inclined to take xarelto through February 2014 when he will complete 4 years of  anticoagulations. He is willing to take xarelto at a preventative dose of 10 mg today.   Body mass index is 34.49 kg/(m^2).    Past, Family, Social reviewed: In July 2013 he was admitted to behavioral health in [place] for severe man[place]. He is currently on long-term disability. He is still married Review of Systems  Constitutional: Negative for fever and unexpected weight change.  HENT: Negative for ear pain, nosebleeds, congestion, sore throat, rhinorrhea, sneezing, trouble swallowing, dental problem, postnasal drip and sinus pressure.   Eyes: Negative for redness and itching.  Respiratory: Negative for cough, chest tightness, shortness of breath and wheezing.   Cardiovascular: Negative for palpitations and leg swelling.  Gastrointestinal: Negative for nausea and vomiting.  Genitourinary: Negative for dysuria.  Musculoskeletal: Negative for joint swelling.  Skin: Negative for rash.  Neurological: Negative for headaches.  Hematological: Does not bruise/bleed easily.  Psychiatric/Behavioral: Negative for dysphoric mood. The patient is not nervous/anxious.        Objective:   Physical Exam  Nursing note and vitals reviewed. Constitutional: He is oriented to person, place, and time. He appears well-developed and well-nourished. No distress.  Obese  HENT:  Head: Normocephalic and atraumatic.  Right Ear: External ear normal.  Left Ear: External ear normal.  Mouth/Throat: Oropharynx is clear and moist. No oropharyngeal exudate.  Eyes: Conjunctivae and EOM are normal. Pupils are equal, round, and reactive  to light. Right eye exhibits no discharge. Left eye exhibits no discharge. No scleral icterus.  Neck: Normal range of motion. Neck supple. No JVD present. No tracheal deviation present. No thyromegaly present.  Cardiovascular: Normal rate, regular rhythm and intact distal pulses.  Exam reveals no gallop and no friction rub.   No murmur heard. Pulmonary/Chest: Effort  normal and breath sounds normal. No respiratory distress. He has no wheezes. He has no rales. He exhibits no tenderness.  Abdominal: Soft. Bowel sounds are normal. He exhibits no distension and no mass. There is no tenderness. There is no rebound and no guarding.  Musculoskeletal: Normal range of motion. He exhibits no edema and no tenderness.  Lymphadenopathy:    He has no cervical adenopathy.  Neurological: He is alert and oriented to person, place, and time. He has normal reflexes. No cranial nerve deficit. Coordination normal.  Skin: Skin is warm and dry. No rash noted. He is not diaphoretic. No erythema. No pallor.  Psychiatric: His behavior is normal. Judgment and thought content normal.  Depressed affect      Assessment & Plan:

## 2013-01-29 NOTE — Patient Instructions (Addendum)
#  1 pulmonary embolism -Stop Coumadin -Start xarelto 10 mg once daily 2 days after stopping Coumadin  - Notie this is  a preventative dose -Continue aspirin 81 mg daily - Continue to lose weight which will help reduce the risk of future pulmonary embolism   #2 followup Call in 1 month to tell us how you are tolerating the xarelto Return in February 2015 to evaluate discontinuation of xarelto

## 2013-01-30 NOTE — Assessment & Plan Note (Signed)
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 Since then he has been on Coumadin. Target INR was greater than 2 from February 2011 06/04/2011. Since October 2012 he had a target INR greater than 1.5. He is now completed 3 years and 3 months of anticoagulation therapy for idiopathic PE. A rationale for extended anticoagulation was to prevent recurrence. This approach is supported by the literature. He now wants to change to xarelto. I offered him the opportunity to stop all anticoagulation altogether because he has had sufficient prolonged anticoagulation. However, he is extremely keen on continued anticoagulation at least through February 2015 when he would've completed 4 years of anticoagulation. He is also extremely keen on switching to xarelto for convenience. Therefore we've opted to do xarelto 10 mg per day which would be a preventative dose. Quickly but she says did not reveal any contraindications. He is aware of lack of specific antidotes in case he develops bleeding. I've told him to also lose weight; obesity is a risk factor for pulmonary embolism. He verbalized understanding and agreed to make efforts to lose weight   > 50% of this > 25 min visit spent in face to face counseling (15 min visit converted to 25 min)

## 2013-10-27 ENCOUNTER — Encounter (INDEPENDENT_AMBULATORY_CARE_PROVIDER_SITE_OTHER): Payer: Self-pay

## 2013-10-27 ENCOUNTER — Encounter: Payer: Self-pay | Admitting: Internal Medicine

## 2013-10-27 ENCOUNTER — Ambulatory Visit (INDEPENDENT_AMBULATORY_CARE_PROVIDER_SITE_OTHER): Payer: BC Managed Care – PPO | Admitting: Internal Medicine

## 2013-10-27 VITALS — BP 132/88 | HR 91 | Temp 97.6°F | Ht 68.0 in | Wt 223.2 lb

## 2013-10-27 DIAGNOSIS — I2699 Other pulmonary embolism without acute cor pulmonale: Secondary | ICD-10-CM

## 2013-10-27 NOTE — Progress Notes (Signed)
Subjective:    Patient ID: Sean Winters, male    DOB: 09-23-1955, 58 y.o.   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   -  FU CT for chest pain dec 2011: negative for PE.   - STress cardioliite 2012: negative for CAD    - Coumadin for INR > 2 through October 2012 - Coumadin for INR > 1.07 June 2011 onwards through May 2014 - Xarelto 10mg  May 2014 - Feb 2015 - Aspirin 81mg  per day - Feb 2015 to life    OV 10/27/2013 Followup pulmonary embolism. He is now finished 4 years anticoagulations; timeline as above. Since may 2014 he is on aspirin 81mg  per day and xarelto 10mg  per day. He denies any chest pain, shortness of breath, hemoptysis, edema, dizziness, palpitations. There have been no other pulmonary embolism episodes. He is willing to come  Off xarelto due to bleeding risk etc. But is also worried about recurrence. He continues to be obese and sedentary spending >8h in day time sitting.   Past, Family, Social reviewed: In July 2013 he was admitted to behavioral health in [place] for [place] man[place]. He is currently on long-term disability. He is still married. Continues to battle depression and anxiety but no admissions since last visit. Family suppoprtive per him  #1 pulmonary embolism -Stop Coumadin -Start xarelto 10 mg once daily 2 days after stopping Coumadin  - Notie this is  a preventative dose -Continue aspirin 81 mg daily - Continue to lose weight which will help reduce the risk of future pulmonary embolism   #2 followup Call in 1 month to tell us how you are tolerating the xarelto Return in February 2015 to evaluate discontinuation of xarelto     Review of Systems  Constitutional: Negative for fever and unexpected weight change.  HENT: Positive for sinus pressure. Negative for congestion, dental problem, ear pain, nosebleeds, postnasal drip, rhinorrhea, sneezing, sore throat and trouble  swallowing.   Eyes: Negative for redness and itching.  Respiratory: Negative for cough, chest tightness, shortness of breath and wheezing.   Cardiovascular: Negative for palpitations and leg swelling.  Gastrointestinal: Negative for nausea and vomiting.  Genitourinary: Negative for dysuria.  Musculoskeletal: Negative for joint swelling.  Skin: Negative for rash.  Neurological: Negative for headaches.  Hematological: Does not bruise/bleed easily.  Psychiatric/Behavioral: Negative for dysphoric mood. The patient is nervous/anxious.        Objective:   Physical Exam  Nursing note and vitals reviewed. Constitutional: He is oriented to person, place, and time. He appears well-developed and well-nourished. No distress.  Body mass index is 33.95 kg/(m^2).   HENT:  Head: Normocephalic and atraumatic.  Right Ear: External ear normal.  Left Ear: External ear normal.  Mouth/Throat: Oropharynx is clear and moist. No oropharyngeal exudate.  Eyes: Conjunctivae and EOM are normal. Pupils are equal, round, and reactive to light. Right eye exhibits no discharge. Left eye exhibits no discharge. No scleral icterus.  Neck: Normal range of motion. Neck supple. No JVD present. No tracheal deviation present. No thyromegaly present.  Cardiovascular: Normal rate, regular rhythm and intact distal pulses.  Exam reveals no gallop and no friction rub.   No murmur heard. Pulmonary/Chest: Effort normal and breath sounds normal. No respiratory distress. He has no wheezes. He has no rales. He exhibits no tenderness.  Abdominal: Soft. Bowel sounds are normal. He exhibits no distension and no mass. There is no  tenderness. There is no rebound and no guarding.  Musculoskeletal: Normal range of motion. He exhibits no edema and no tenderness.  Lymphadenopathy:    He has no cervical adenopathy.  Neurological: He is alert and oriented to person, place, and time. He has normal reflexes. No cranial nerve deficit.  Coordination normal.  Skin: Skin is warm and dry. No rash noted. He is not diaphoretic. No erythema. No pallor.  Psychiatric: Judgment normal.  flate affect          Assessment & Plan:

## 2013-10-27 NOTE — Patient Instructions (Addendum)
#  1 pulmonary embolism -Stop  xarelto 10 mg  -Continue aspirin 81 mg daily - Continue to lose weight which will help reduce the risk of future pulmonary embolism - do not spend more than 6h in day time sitting (explained hazards of sitting >6h/day)  - be active - for any air or car travel beyond 5h, take xaerlto 10mg  one time before travel   #2 followup REturn in feb 2016

## 2013-10-28 NOTE — Assessment & Plan Note (Signed)
#  1 pulmonary embolism -Stop  xarelto 10 mg  -Continue aspirin 81 mg daily - Continue to lose weight which will help reduce the risk of future pulmonary embolism - do not spend more than 6h in day time sitting (explained hazards of sitting >6h/day)  - be active - for any air or car travel beyond 5h, take xaerlto 10mg one time before travel   #2 followup REturn in feb 2016 

## 2014-06-09 ENCOUNTER — Encounter: Payer: Self-pay | Admitting: Internal Medicine

## 2014-11-02 ENCOUNTER — Ambulatory Visit (INDEPENDENT_AMBULATORY_CARE_PROVIDER_SITE_OTHER): Payer: Managed Care, Other (non HMO) | Admitting: Internal Medicine

## 2014-11-02 ENCOUNTER — Encounter: Payer: Self-pay | Admitting: Internal Medicine

## 2014-11-02 ENCOUNTER — Other Ambulatory Visit: Payer: Managed Care, Other (non HMO)

## 2014-11-02 VITALS — BP 116/82 | HR 74 | Ht 68.0 in | Wt 202.0 lb

## 2014-11-02 DIAGNOSIS — I2699 Other pulmonary embolism without acute cor pulmonale: Secondary | ICD-10-CM

## 2014-11-02 DIAGNOSIS — Z0000 Encounter for general adult medical examination without abnormal findings: Secondary | ICD-10-CM | POA: Insufficient documentation

## 2014-11-02 NOTE — Patient Instructions (Addendum)
ICD-9-CM ICD-10-CM   1. Pulmonary embolism 415.19 I26.99   2. Healthcare maintenance V70.0 Z00.00      #1 pulmonary embolism - glad you are doing well 1 year off antiocoagulants  - check d-dimer blood test today  - if normal will just continue aspirin 81 mg daily for life  - if high, will consider restart xarelto 10mg  daily low dose  - Continue to lose weight which will help reduce the risk of future pulmonary embolism - do not spend more than 6h in day time sitting (explained hazards of sitting >6h/day)  - be active - for any air or car travel beyond 5h, take breaks  #health maintenance  - at age 59 you should have Prevnar pneumonia vaccine and age 59 you should have repeat pneumovax  - both against pneumococcus  #2 followup Depending on d-dimer blood test result

## 2014-11-02 NOTE — Progress Notes (Signed)
Subjective:    Patient ID: Sean Winters, male    DOB: Mar 29, 1956, 59 y.o.   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   -  FU CT for chest pain dec 2011: negative for PE.   - STress cardioliite 2012: negative for CAD    - Coumadin for INR > 2 through October 2012 - Coumadin for INR > 1.07 June 2011 onwards through May 2014 - Xarelto  May 2014 - Feb 2015 - Aspirin  per day - Feb 2015 to life    OV 10/27/2013 Followup pulmonary embolism. He is now finished 4 years anticoagulations; timeline as above. Since may 2014 he is on aspirin  per day and xarelto  per day. He denies any chest pain, shortness of breath, hemoptysis, edema, dizziness, palpitations. There have been no other pulmonary embolism episodes. He is willing to come  Off xarelto due to bleeding risk etc. But is also worried about recurrence. He continues to be obese and sedentary spending >8h in day time sitting.   Past, Family, Social reviewed: In July 2013 he was admitted to behavioral health in [place] for [place] man[place]. He is currently on long-term disability. He is still married. Continues to battle depression and anxiety but no admissions since last visit. Family suppoprtive per him  #1 pulmonary embolism -Stop Coumadin -Start xarelto 10 mg once daily 2 days after stopping Coumadin  - Notie this is  a preventative dose -Continue aspirin 81 mg daily - Continue to lose weight which will help reduce the risk of future pulmonary embolism   #2 followup Call in 1 month to tell us how you are tolerating the xarelto Return in February 2015 to evaluate discontinuation of xarelto      OV 11/02/2014  Chief Complaint  Patient presents with  . Follow-up    Pt stated his breathing is doing well. Pt denies SOB, cough, CP/tightness. Pt states overall he is doing well. Denies any complaints.    Follow-up pulmonary embolism as  above. For the past one year is be on baby aspirin once daily as prophylaxis against pulmonary embolism. This is after taking Xarelto for several years. He has done well without any recurrence. No new medical diagnoses. His bipolar disorder is under control. He is applying for new jobs and has a positive attitude with life. His dad did pass away a few weeks ago and he is somewhat struggling with this.  He is still concerned about the recurrence of blood clots. We did entertain a discussion about the risk stratification using d-dimer blood test and he is interested in this  New issue  - Regarding health maintenance. In 2011 when he was in the hospital he got Pneumovax. He is now asking for repeat vaccination of the same. However he is immunocompetent and does not have diabetes, cancer, COPD, asthma, active smoking history. Therefore is eligible for pneumonia vaccine when he is 81   has a past medical history of Pulmonary embolism (10/2009); Dyslipidemia; Cystitis, acute; Diverticulitis; Lumbago; Nephrolithiasis; BPH (benign prostatic hypertrophy); Chest pain; and Dyspnea.   reports that he quit smoking about 27 years ago. His smoking use included Cigarettes. He has a 18 pack-year smoking history. He does not have any smokeless tobacco history on file.  Past Surgical History  Procedure Laterality Date  . Kidney stones      REMOVAL   . Knee arthroscopy  RIGHT KNEE  . Tonsillectomy      Allergies  Allergen Reactions  . Sulfa Antibiotics Rash    Immunization History  Administered Date(s) Administered  . Influenza Split 06/02/2013, 06/02/2014  . Influenza Whole 10/21/2009, 06/03/2011, 07/03/2012  . Pneumococcal Polysaccharide-23 10/21/2009    Family History  Problem Relation Age of Onset  . Heart attack Father [id number]  . Hyperlipidemia Father   . Diabetes Father   . Hypertension Father   . Fibromyalgia Sister   . Stroke Mother   . Arrhythmia Mother     atrial fib.     Current  outpatient prescriptions:  .  ARIPiprazole (ABILIFY) 5 MG tablet, Take 5 mg by mouth daily., Disp: , Rfl:  .  aspirin 81 MG tablet, Take 81 mg by mouth daily.  , Disp: , Rfl:  .  atorvastatin (LIPITOR) 20 MG tablet, Take 1 tablet by mouth Daily., Disp: , Rfl:  .  fluticasone (FLONASE) 50 MCG/ACT nasal spray, Place 2 sprays into the nose daily as needed. For allergies, Disp: , Rfl:  .  LamoTRIgine 50 MG TBDP, Take 3 tablets by mouth daily. , Disp: , Rfl:  .  oxazepam (SERAX) 15 MG capsule, Take 15 mg by mouth 3 (three) times daily as needed for sleep., Disp: , Rfl:  .  pramipexole (MIRAPEX) 1.5 MG tablet, Take 1.5 mg by mouth daily., Disp: , Rfl:  .  solifenacin (VESICARE) 5 MG tablet, Take 5 mg by mouth daily.  , Disp: , Rfl:  .  zolpidem (AMBIEN) 10 MG tablet, Take 10 mg by mouth at bedtime as needed for sleep., Disp: , Rfl:      Review of Systems  Constitutional: Negative for fever and unexpected weight change.  HENT: Negative for congestion, dental problem, ear pain, nosebleeds, postnasal drip, rhinorrhea, sinus pressure, sneezing, sore throat and trouble swallowing.   Eyes: Negative for redness and itching.  Respiratory: Negative for cough, chest tightness, shortness of breath and wheezing.   Cardiovascular: Negative for palpitations and leg swelling.  Gastrointestinal: Negative for nausea and vomiting.  Genitourinary: Negative for dysuria.  Musculoskeletal: Negative for joint swelling.  Skin: Negative for rash.  Neurological: Negative for headaches.  Hematological: Does not bruise/bleed easily.  Psychiatric/Behavioral: Negative for dysphoric mood. The patient is not nervous/anxious.        Objective:   Physical Exam  Constitutional: He is oriented to person, place, and time. He appears well-developed and well-nourished. No distress.  Body mass index is 30.72 kg/(m^2).   HENT:  Head: Normocephalic and atraumatic.  Right Ear: External ear normal.  Left Ear: External ear  normal.  Mouth/Throat: Oropharynx is clear and moist. No oropharyngeal exudate.  Eyes: Conjunctivae and EOM are normal. Pupils are equal, round, and reactive to light. Right eye exhibits no discharge. Left eye exhibits no discharge. No scleral icterus.  Neck: Normal range of motion. Neck supple. No JVD present. No tracheal deviation present. No thyromegaly present.  Cardiovascular: Normal rate, regular rhythm and intact distal pulses.  Exam reveals no gallop and no friction rub.   No murmur heard. Pulmonary/Chest: Effort normal and breath sounds normal. No respiratory distress. He has no wheezes. He has no rales. He exhibits no tenderness.  Abdominal: Soft. Bowel sounds are normal. He exhibits no distension and no mass. There is no tenderness. There is no rebound and no guarding.  Musculoskeletal: Normal range of motion. He exhibits no edema or tenderness.  Lymphadenopathy:    He has no cervical adenopathy.  Neurological:  He is alert and oriented to person, place, and time. He has normal reflexes. No cranial nerve deficit. Coordination normal.  Skin: Skin is warm and dry. No rash noted. He is not diaphoretic. No erythema. No pallor.  Psychiatric: He has a normal mood and affect. His behavior is normal. Judgment and thought content normal.  Nursing note and vitals reviewed.    Filed Vitals:   11/02/14 1636  BP: 116/82  Pulse: 74  Height:  (1.727 m)  Weight: 202 lb (91.627 kg)  SpO2: 98%        Assessment & Plan:     ICD-9-CM ICD-10-CM   1. Pulmonary embolism 415.19 I26.99   2. Healthcare maintenance V70.0 Z00.00     #1 pulmonary embolism - glad you are doing well 1 year off antiocoagulants  - check d-dimer blood test today  - if normal will just continue aspirin 81 mg daily for life  - if high, will consider restart xarelto  daily low dose  - Continue to lose weight which will help reduce the risk of future pulmonary embolism - do not spend more than 6h in day time  sitting (explained hazards of sitting >6h/day)  - be active - for any air or car travel beyond 5h, take breaks  #health maintenance - he is immunocompetent and does not have diabetes, cancer, COPD, asthma, active smoking history. Therefore is eligible for pneumonia vaccine when he is 32  - at age 46 you should have Prevnar pneumonia vaccine and age 51 you should have repeat pneumovax  - both against pneumococcus  #2 followup Depending on d-dimer blood test result - addendum 11/05/2014  - this was normal, so will advise him to stay on aspirin and no need for xarelto/coumadin. No need for active fu   Dr. Kalman Shan, M.D., [facility].C.P Pulmonary and Critical Care Medicine Staff Physician [facility] [place] [facility] Pager: [phone], If no answer or between  15:00h - 7:00h: call [phone]  11/02/2014 5:09 PM

## 2014-11-03 ENCOUNTER — Telehealth: Payer: Self-pay | Admitting: Internal Medicine

## 2014-11-03 LAB — D-DIMER, QUANTITATIVE: D-Dimer, Quant: 0.33 ug/mL-FEU (ref 0.00–0.48)

## 2014-11-03 NOTE — Telephone Encounter (Signed)
Called and spoke to pt. Informed pt of the results and recs per MR. Pt verbalized understanding and denied any further questions or concerns at this time.  

## 2014-11-03 NOTE — Telephone Encounter (Signed)
Good news d-dimer normal. Just stay on baby aspirin. No need for active fu. Risk of recurrent clot is low

## 2015-12-01 ENCOUNTER — Emergency Department (HOSPITAL_BASED_OUTPATIENT_CLINIC_OR_DEPARTMENT_OTHER): Payer: 59

## 2015-12-01 ENCOUNTER — Encounter (HOSPITAL_BASED_OUTPATIENT_CLINIC_OR_DEPARTMENT_OTHER): Payer: Self-pay | Admitting: Emergency Medicine

## 2015-12-01 ENCOUNTER — Inpatient Hospital Stay (HOSPITAL_BASED_OUTPATIENT_CLINIC_OR_DEPARTMENT_OTHER)
Admission: EM | Admit: 2015-12-01 | Discharge: 2015-12-05 | DRG: 244 | Disposition: A | Discharge status: Home / Self Care | Payer: 59 | Attending: Cardiology | Admitting: Cardiology

## 2015-12-01 DIAGNOSIS — R001 Bradycardia, unspecified: Secondary | ICD-10-CM | POA: Diagnosis present

## 2015-12-01 DIAGNOSIS — Z79899 Other long term (current) drug therapy: Secondary | ICD-10-CM

## 2015-12-01 DIAGNOSIS — E785 Hyperlipidemia, unspecified: Secondary | ICD-10-CM | POA: Diagnosis present

## 2015-12-01 DIAGNOSIS — Z823 Family history of stroke: Secondary | ICD-10-CM

## 2015-12-01 DIAGNOSIS — R0602 Shortness of breath: Secondary | ICD-10-CM | POA: Diagnosis present

## 2015-12-01 DIAGNOSIS — I442 Atrioventricular block, complete: Secondary | ICD-10-CM | POA: Diagnosis present

## 2015-12-01 DIAGNOSIS — R0600 Dyspnea, unspecified: Secondary | ICD-10-CM | POA: Diagnosis not present

## 2015-12-01 DIAGNOSIS — Z950 Presence of cardiac pacemaker: Secondary | ICD-10-CM

## 2015-12-01 DIAGNOSIS — Z86711 Personal history of pulmonary embolism: Secondary | ICD-10-CM

## 2015-12-01 DIAGNOSIS — Z882 Allergy status to sulfonamides status: Secondary | ICD-10-CM

## 2015-12-01 DIAGNOSIS — Z8249 Family history of ischemic heart disease and other diseases of the circulatory system: Secondary | ICD-10-CM | POA: Diagnosis not present

## 2015-12-01 DIAGNOSIS — I459 Conduction disorder, unspecified: Secondary | ICD-10-CM | POA: Diagnosis present

## 2015-12-01 DIAGNOSIS — F319 Bipolar disorder, unspecified: Secondary | ICD-10-CM | POA: Diagnosis present

## 2015-12-01 DIAGNOSIS — Z87891 Personal history of nicotine dependence: Secondary | ICD-10-CM

## 2015-12-01 DIAGNOSIS — Z7982 Long term (current) use of aspirin: Secondary | ICD-10-CM

## 2015-12-01 HISTORY — DX: Anxiety disorder, unspecified: F41.9

## 2015-12-01 HISTORY — DX: Bipolar disorder, unspecified: F31.9

## 2015-12-01 HISTORY — DX: Major depressive disorder, single episode, unspecified: F32.9

## 2015-12-01 HISTORY — DX: Depression, unspecified: F32.A

## 2015-12-01 LAB — CBC WITH DIFFERENTIAL/PLATELET
Band Neutrophils: 0 %
Basophils Absolute: 0 10*3/uL (ref 0.0–0.1)
Basophils Relative: 0 %
Blasts: 0 %
Eosinophils Absolute: 0.2 10*3/uL (ref 0.0–0.7)
Eosinophils Relative: 1 %
HCT: 42.7 % (ref 39.0–52.0)
Hemoglobin: 14.3 g/dL (ref 13.0–17.0)
Lymphocytes Relative: 30 %
Lymphs Abs: 4.5 10*3/uL — ABNORMAL HIGH (ref 0.7–4.0)
MCH: 29.4 pg (ref 26.0–34.0)
MCHC: 33.5 g/dL (ref 30.0–36.0)
MCV: 87.9 fL (ref 78.0–100.0)
Metamyelocytes Relative: 0 %
Monocytes Absolute: 0.9 10*3/uL (ref 0.1–1.0)
Monocytes Relative: 6 %
Myelocytes: 0 %
Neutro Abs: 9.5 10*3/uL — ABNORMAL HIGH (ref 1.7–7.7)
Neutrophils Relative %: 63 %
Platelets: 339 10*3/uL (ref 150–400)
Promyelocytes Absolute: 0 %
RBC: 4.86 MIL/uL (ref 4.22–5.81)
RDW: 13.7 % (ref 11.5–15.5)
WBC: 15.1 10*3/uL — ABNORMAL HIGH (ref 4.0–10.5)
nRBC: 0 /100 WBC

## 2015-12-01 LAB — BASIC METABOLIC PANEL
Anion gap: 8 (ref 5–15)
BUN: 17 mg/dL (ref 6–20)
CO2: 24 mmol/L (ref 22–32)
Calcium: 9 mg/dL (ref 8.9–10.3)
Chloride: 106 mmol/L (ref 101–111)
Creatinine, Ser: 1.29 mg/dL — ABNORMAL HIGH (ref 0.61–1.24)
GFR calc Af Amer: >60 mL/min (ref >60)
GFR calc non Af Amer: 58 mL/min — ABNORMAL LOW (ref >60)
Glucose, Bld: 102 mg/dL — ABNORMAL HIGH (ref 65–99)
Potassium: 4.1 mmol/L (ref 3.5–5.1)
Sodium: 138 mmol/L (ref 135–145)

## 2015-12-01 LAB — TROPONIN I: Troponin I: <0.03 ng/mL (ref <0.031)

## 2015-12-01 LAB — D-DIMER, QUANTITATIVE (NOT AT ARMC): D-Dimer, Quant: 0.63 ug/mL-FEU — ABNORMAL HIGH (ref 0.00–0.50)

## 2015-12-01 LAB — LITHIUM LEVEL: Lithium Lvl: 0.48 mmol/L — ABNORMAL LOW (ref 0.60–1.20)

## 2015-12-01 MED ORDER — IOPAMIDOL (ISOVUE-370) INJECTION 76%
100.0000 mL | Freq: Once | INTRAVENOUS | Status: AC | PRN
  Administered 2015-12-01: 100 mL via INTRAVENOUS

## 2015-12-01 NOTE — ED Notes (Signed)
Attempted report to unit. Gave callback number.

## 2015-12-01 NOTE — ED Notes (Signed)
Pads applied

## 2015-12-01 NOTE — ED Notes (Signed)
Family at bedside. 

## 2015-12-01 NOTE — ED Notes (Signed)
Zoll pads placed on pt per EDP request.

## 2015-12-01 NOTE — ED Notes (Signed)
Pt placed on automatic VS, continuous cardiac and pulse ox monitoring.  

## 2015-12-01 NOTE — ED Notes (Signed)
Pt having SOB since Sunday, past hx of PE in both lungs

## 2015-12-01 NOTE — ED Notes (Signed)
Pt to CT with RN at bedside and monitor on.

## 2015-12-01 NOTE — ED Provider Notes (Signed)
CSN: [id number]     Arrival date & time 12/01/15  1849 History  By signing my name below, I, Emmanuella Mensah, attest that this documentation has been prepared under the direction and in the presence of Doug Sou, MD. Electronically Signed: Angelene Giovanni, ED Scribe. 12/01/2015. 7:30 PM.    Chief Complaint  Patient presents with  . Shortness of Breath   The history is provided by the patient. No language interpreter was used.   HPI Comments: Sean Winters is a 60 y.o. male with a hx of PE, who presents to the Emergency Department complaining of multiple episodes of intermittent moderate SOB onset 5 days ago. He reports no associated lightheadedness. He states that he has been walking several miles approx. 3 times a week.No treatment prior to coming here no chest pain no cough no other associated symptoms nothing makes symptoms better or worse presently dyspnea is minimal He endorses that he had a PE in 2011 and was on anticoagulants for 5 years. He explains that he was Coumadin then Xarelto for one year. Pt's wife adds that earlier in the week pt stated that his symptoms were similar to when he had his PE. No alleviating factors noted. Pt denies that he is a current smoker or drinks alcohol. Pt is currently on Lithium. He denies any current CP, n/v, or weakness.   PCP: Dr. Andrey Campanile  Cardiologist: Dr. [place] but has not seem him in approx. 4-5 years.    Past Medical History  Diagnosis Date  . Pulmonary embolism (HCC) 10/2009  . Dyslipidemia   . Cystitis, acute   . Diverticulitis   . Lumbago   . Nephrolithiasis   . BPH (benign prostatic hypertrophy)   . Chest pain   . Dyspnea   Bipolar disorder Past Surgical History  Procedure Laterality Date  . Kidney stones      REMOVAL   . Knee arthroscopy      RIGHT KNEE  . Tonsillectomy     Family History  Problem Relation Age of Onset  . Heart attack Father 8  . Hyperlipidemia Father   . Diabetes Father   . Hypertension Father    . Fibromyalgia Sister   . Stroke Mother   . Arrhythmia Mother     atrial fib.   Social History  Substance Use Topics  . Smoking status: Former Smoker -- 1.00 packs/day for 18 years    Types: Cigarettes    Quit date: 09/03/1987  . Smokeless tobacco: None  . Alcohol Use: No    Review of Systems  Constitutional: Negative.   HENT: Negative.   Respiratory: Positive for shortness of breath.   Cardiovascular: Negative.  Negative for chest pain.  Gastrointestinal: Negative.  Negative for nausea and vomiting.  Musculoskeletal: Negative.   Skin: Negative.   Neurological: Positive for light-headedness. Negative for weakness.  Psychiatric/Behavioral: Negative.   All other systems reviewed and are negative.     Allergies  Sulfa antibiotics  Home Medications   Prior to Admission medications   Medication Sig Start Date End Date Taking? Authorizing Provider  LITHIUM PO Take 900 mg by mouth.   Yes Historical Provider, MD  aspirin 81 MG tablet Take 81 mg by mouth daily.      Historical Provider, MD  atorvastatin (LIPITOR) 20 MG tablet Take 1 tablet by mouth Daily. 01/18/12   Historical Provider, MD  fluticasone (FLONASE) 50 MCG/ACT nasal spray Place 2 sprays into the nose daily as needed. For allergies    Historical  Provider, MD  LamoTRIgine 50 MG TBDP Take 3 tablets by mouth daily.     Historical Provider, MD  pramipexole (MIRAPEX) 1.5 MG tablet Take 1.5 mg by mouth daily.    Historical Provider, MD  zolpidem (AMBIEN) 10 MG tablet Take 10 mg by mouth at bedtime as needed for sleep.    Historical Provider, MD   BP 167/64 mmHg  Pulse 44  Temp(Src) 98.7 F (37.1 C) (Oral)  Resp 18  Ht  (1.727 m)  Wt 205 lb (92.987 kg)  BMI 31.18 kg/m2  SpO2 97% Physical Exam  Constitutional: He appears well-developed and well-nourished.  HENT:  Head: Normocephalic and atraumatic.  Eyes: Conjunctivae are normal. Pupils are equal, round, and reactive to light.  Neck: Neck supple. No tracheal  deviation present. No thyromegaly present.  Cardiovascular: Regular rhythm.   No murmur heard. Bradycardic  Pulmonary/Chest: Effort normal and breath sounds normal.  Abdominal: Soft. Bowel sounds are normal. He exhibits no distension. There is no tenderness.  Musculoskeletal: Normal range of motion. He exhibits no edema or tenderness.  Neurological: He is alert. Coordination normal.  Skin: Skin is warm and dry. No rash noted.  Psychiatric: He has a normal mood and affect.  Nursing note and vitals reviewed.   ED Course  Procedures (including critical care time) DIAGNOSTIC STUDIES: Oxygen Saturation is 97% on RA, normal by my interpretation.    COORDINATION OF CARE: 7:26 PM- Pt advised of plan for treatment and pt agrees. Explained pt's EKG results. He will receive lab work and chest x-ray for further evaluation. Pt will be admitted.    Labs Review Labs Reviewed  D-DIMER, QUANTITATIVE (NOT AT [facility])  BASIC METABOLIC PANEL  CBC WITH DIFFERENTIAL/PLATELET  TROPONIN I    Imaging Review No results found.   Doug Sou, MD has personally reviewed and evaluated these images and lab results as part of his medical decision-making.   EKG Interpretation None     ED ECG REPORT   Date: 12/01/2015  Rate: 40  Rhythm: Complete heart block  QRS Axis: normal  Intervals: normal  ST/T Wave abnormalities: normal  Conduction Disutrbances:none  Narrative Interpretation:   Old EKG Reviewed: Tracing from 04/11/2012 showed normal sinus rhythm  I have personally reviewed the EKG tracing and agree with the computerized printout as noted.Chest x-ray viewed by me Results for orders placed or performed during the hospital encounter of 12/01/15  D-dimer, quantitative (not at [facility])  Result Value Ref Range   D-Dimer, Quant 0.63 (H) 0.00 - 0.50 ug/mL-FEU  Basic metabolic panel  Result Value Ref Range   Sodium 138 135 - 145 mmol/L   Potassium 4.1 3.5 - 5.1 mmol/L   Chloride 106 101 - 111  mmol/L   CO2 24 22 - 32 mmol/L   Glucose, Bld 102 (H) 65 - 99 mg/dL   BUN 17 6 - 20 mg/dL   Creatinine, Ser 1.61 (H) 0.61 - 1.24 mg/dL   Calcium 9.0 8.9 - 09.6 mg/dL   GFR calc non Af Amer 58 (L) >60 mL/min   GFR calc Af Amer >60 >60 mL/min   Anion gap 8 5 - 15  CBC with Differential/Platelet  Result Value Ref Range   WBC 15.1 (H) 4.0 - 10.5 K/uL   RBC 4.86 4.22 - 5.81 MIL/uL   Hemoglobin 14.3 13.0 - 17.0 g/dL   HCT 04.5 40.9 - 81.1 %   MCV 87.9 78.0 - 100.0 fL   MCH 29.4 26.0 - 34.0 pg  MCHC 33.5 30.0 - 36.0 g/dL   RDW 16.1 09.6 - 04.5 %   Platelets 339 150 - 400 K/uL   Neutrophils Relative % 63 %   Lymphocytes Relative 30 %   Monocytes Relative 6 %   Eosinophils Relative 1 %   Basophils Relative 0 %   Band Neutrophils 0 %   Metamyelocytes Relative 0 %   Myelocytes 0 %   Promyelocytes Absolute 0 %   Blasts 0 %   nRBC 0 0 /100 WBC   Neutro Abs 9.5 (H) 1.7 - 7.7 K/uL   Lymphs Abs 4.5 (H) 0.7 - 4.0 K/uL   Monocytes Absolute 0.9 0.1 - 1.0 K/uL   Eosinophils Absolute 0.2 0.0 - 0.7 K/uL   Basophils Absolute 0.0 0.0 - 0.1 K/uL   Smear Review MORPHOLOGY UNREMARKABLE   Troponin I  Result Value Ref Range   Troponin I <0.03 <0.031 ng/mL  Lithium level  Result Value Ref Range   Lithium Lvl 0.48 (L) 0.60 - 1.20 mmol/L   Ct Angio Chest Pe W/cm &/or Wo Cm  12/01/2015  CLINICAL DATA:  Multiple episodes of intermittent moderate shortness of breath beginning 5 days ago, has been walking several miles 3 times a week, leukocytosis, D-dimer = 0.63, history of BILATERAL pulmonary embolism, former smoker EXAM: CT ANGIOGRAPHY CHEST WITH CONTRAST TECHNIQUE: Multidetector CT imaging of the chest was performed using the standard protocol during bolus administration of intravenous contrast. Multiplanar CT image reconstructions and MIPs were obtained to evaluate the vascular anatomy. CONTRAST:  75 cc Isovue 370 IV COMPARISON:  08/20/2010 FINDINGS: Aorta normal caliber without aneurysm or  dissection. Pulmonary arteries well opacified and patent. Scattered streak artifacts at RIGHT upper lobe pulmonary artery centrally from dense contrast in SVC. No evidence of pulmonary embolism. No thoracic adenopathy. Nonspecific air within the thoracic esophagus. Intermediate attenuation mass at posterior aspect RIGHT lobe liver on last image, incompletely visualized, 3.0 x 2.1 cm, stable in size since 2011. Visualized upper abdomen otherwise unremarkable. 4 mm LEFT apex nodule unchanged. Minimal subsegmental atelectasis lingula. Remaining lungs clear. No pulmonary infiltrate, pleural effusion, or pneumothorax. Review of the MIP images confirms the above findings. IMPRESSION: No evidence of pulmonary embolism. Stable 4 mm LEFT apex nodule. Stable nonspecific intermediate attenuation lesion at posterior aspect RIGHT lobe liver unchanged since 2011, likely benign due to 5 years of stability ; this could represent a complicated cyst, hemangioma or other benign lesion. Electronically Signed   By: Ulyses Southward M.D.   On: 12/01/2015 21:30   Dg Chest Port 1 View  12/01/2015  CLINICAL DATA:  Shortness of breath and chest pain EXAM: PORTABLE CHEST 1 VIEW COMPARISON:  03/13/2012 chest radiograph. FINDINGS: Stable cardiomediastinal silhouette with top-normal heart size. No pneumothorax. No pleural effusion. Mild bibasilar scarring versus atelectasis. No acute consolidative airspace disease. No overt pulmonary edema. IMPRESSION: Mild bibasilar scarring versus atelectasis. Otherwise no active disease in the chest. Electronically Signed   By: Delbert Phenix M.D.   On: 12/01/2015 19:54   Pacemaker pads were placed on patient MDM  Dr.Azeem was consulted and accepts patient in transfer to telemetry floor. Patient will likely need pacemaker. Lithium level pending. Diagnosis complete heart block Final diagnoses:  None   CRITICAL CARE Performed by: Doug Sou Total critical care time: 30 minutes Critical care time was  exclusive of separately billable procedures and treating other patients. Critical care was necessary to treat or prevent imminent or life-threatening deterioration. Critical care was time spent personally by me on  the following activities: development of treatment plan with patient and/or surrogate as well as nursing, discussions with consultants, evaluation of patient's response to treatment, examination of patient, obtaining history from patient or surrogate, ordering and performing treatments and interventions, ordering and review of laboratory studies, ordering and review of radiographic studies, pulse oximetry and re-evaluation of patient's condition.       Doug SouSam Dinara Lupu, MD 12/01/15 2229

## 2015-12-02 ENCOUNTER — Encounter (HOSPITAL_COMMUNITY): Payer: Self-pay | Admitting: *Deleted

## 2015-12-02 DIAGNOSIS — I442 Atrioventricular block, complete: Principal | ICD-10-CM

## 2015-12-02 DIAGNOSIS — I459 Conduction disorder, unspecified: Secondary | ICD-10-CM | POA: Diagnosis present

## 2015-12-02 LAB — CREATININE, SERUM
Creatinine, Ser: 1.13 mg/dL (ref 0.61–1.24)
GFR calc Af Amer: >60 mL/min (ref >60)
GFR calc non Af Amer: >60 mL/min (ref >60)

## 2015-12-02 LAB — CBC
HCT: 41.2 % (ref 39.0–52.0)
Hemoglobin: 13.4 g/dL (ref 13.0–17.0)
MCH: 28.4 pg (ref 26.0–34.0)
MCHC: 32.5 g/dL (ref 30.0–36.0)
MCV: 87.3 fL (ref 78.0–100.0)
Platelets: 294 10*3/uL (ref 150–400)
RBC: 4.72 MIL/uL (ref 4.22–5.81)
RDW: 13.7 % (ref 11.5–15.5)
WBC: 9.6 10*3/uL (ref 4.0–10.5)

## 2015-12-02 LAB — T4, FREE: Free T4: 1.01 ng/dL (ref 0.61–1.12)

## 2015-12-02 LAB — PROTIME-INR
INR: 1.1 (ref 0.00–1.49)
Prothrombin Time: 14.4 seconds (ref 11.6–15.2)

## 2015-12-02 LAB — LIPID PANEL
Cholesterol: 145 mg/dL (ref 0–200)
HDL: 38 mg/dL — ABNORMAL LOW (ref >40)
LDL Cholesterol: 94 mg/dL (ref 0–99)
Total CHOL/HDL Ratio: 3.8 RATIO
Triglycerides: 64 mg/dL (ref <150)
VLDL: 13 mg/dL (ref 0–40)

## 2015-12-02 LAB — TSH: TSH: 4.063 u[IU]/mL (ref 0.350–4.500)

## 2015-12-02 LAB — BRAIN NATRIURETIC PEPTIDE: B Natriuretic Peptide: 379.7 pg/mL — ABNORMAL HIGH (ref 0.0–100.0)

## 2015-12-02 LAB — DIGOXIN LEVEL: Digoxin Level: <0.2 ng/mL — ABNORMAL LOW (ref 0.8–2.0)

## 2015-12-02 LAB — GLUCOSE, CAPILLARY: Glucose-Capillary: 109 mg/dL — ABNORMAL HIGH (ref 65–99)

## 2015-12-02 LAB — TROPONIN I
Troponin I: <0.03 ng/mL (ref <0.031)
Troponin I: <0.03 ng/mL (ref <0.031)

## 2015-12-02 MED ORDER — PRAMIPEXOLE DIHYDROCHLORIDE 1 MG PO TABS
1.5000 mg | ORAL_TABLET | Freq: Every day | ORAL | Status: DC
  Administered 2015-12-02 – 2015-12-04 (×3): 1.5 mg via ORAL
  Filled 2015-12-02 (×3): qty 2

## 2015-12-02 MED ORDER — ACETAMINOPHEN 325 MG PO TABS: 650.0000 mg | ORAL_TABLET | ORAL | Status: DC | PRN

## 2015-12-02 MED ORDER — ZOLPIDEM TARTRATE 5 MG PO TABS
10.0000 mg | ORAL_TABLET | Freq: Every evening | ORAL | Status: DC | PRN
  Administered 2015-12-02 – 2015-12-04 (×4): 10 mg via ORAL
  Filled 2015-12-02 (×4): qty 2

## 2015-12-02 MED ORDER — LAMOTRIGINE 25 MG PO TABS
75.0000 mg | ORAL_TABLET | Freq: Every day | ORAL | Status: DC
  Administered 2015-12-02 – 2015-12-05 (×4): 75 mg via ORAL
  Filled 2015-12-02 (×7): qty 3

## 2015-12-02 MED ORDER — ASPIRIN EC 81 MG PO TBEC
81.0000 mg | DELAYED_RELEASE_TABLET | Freq: Every day | ORAL | Status: DC
  Administered 2015-12-02 – 2015-12-05 (×4): 81 mg via ORAL
  Filled 2015-12-02 (×4): qty 1

## 2015-12-02 MED ORDER — NITROGLYCERIN 0.4 MG SL SUBL: 0.4000 mg | SUBLINGUAL_TABLET | SUBLINGUAL | Status: DC | PRN

## 2015-12-02 MED ORDER — ATORVASTATIN CALCIUM 20 MG PO TABS
20.0000 mg | ORAL_TABLET | Freq: Every day | ORAL | Status: DC
  Administered 2015-12-02 – 2015-12-04 (×3): 20 mg via ORAL
  Filled 2015-12-02 (×3): qty 1

## 2015-12-02 MED ORDER — ENOXAPARIN SODIUM 40 MG/0.4ML ~~LOC~~ SOLN
40.0000 mg | Freq: Every day | SUBCUTANEOUS | Status: DC
  Administered 2015-12-02 – 2015-12-03 (×2): 40 mg via SUBCUTANEOUS
  Filled 2015-12-02 (×2): qty 0.4

## 2015-12-02 MED ORDER — LAMOTRIGINE 100 MG PO TABS: 150.0000 mg | ORAL_TABLET | Freq: Every day | ORAL | Status: DC

## 2015-12-02 MED ORDER — PRAMIPEXOLE DIHYDROCHLORIDE 1 MG PO TABS: 1.5000 mg | ORAL_TABLET | Freq: Every day | ORAL | Status: DC

## 2015-12-02 MED ORDER — LITHIUM CARBONATE ER 450 MG PO TBCR
900.0000 mg | EXTENDED_RELEASE_TABLET | Freq: Every day | ORAL | Status: DC
  Administered 2015-12-02 – 2015-12-04 (×3): 900 mg via ORAL
  Filled 2015-12-02 (×4): qty 2

## 2015-12-02 NOTE — H&P (Addendum)
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 Reports that a week ago while walking, he had more than normal sweating and developed SOB. No CP, palpitations, syncope. Felt dizzy and since has postural dizziness on standing along with ongoing dyspnea on exertion. No fever, chills, cough, orthopnea, PND, edema. No known CAD, MI, HF, CVA, DM. Nonsmoker.    ECG today showed Sinus rhythm in 90's with complete AV block and junctional escape at 43 bpm Second ECG by EMS today showed 2:1 AV block  ECG 04/11/12 PR 203 ms with SR rate in 70's  Trop <0.03, CTA chest no PE  Review of Systems:  10 systems reviewed unremarkable except as noted in HPI    Past Medical History  Diagnosis Date  . Pulmonary embolism (HCC) 10/2009  . Dyslipidemia   . Cystitis, acute   . Diverticulitis   . Lumbago   . Nephrolithiasis   . BPH (benign prostatic hypertrophy)   . Chest pain   . Dyspnea    No current facility-administered medications on file prior to encounter.   Current Outpatient Prescriptions on File Prior to Encounter  Medication Sig Dispense Refill  . aspirin 81 MG tablet Take 81 mg by mouth daily.      [name] atorvastatin (LIPITOR) 20 MG tablet Take 1 tablet by mouth Daily.    . fluticasone (FLONASE) 50 MCG/ACT nasal spray Place 2 sprays into the nose daily as needed. For allergies    . LamoTRIgine 50 MG TBDP Take 3 tablets by mouth daily.     . pramipexole (MIRAPEX) 1.5 MG tablet Take 1.5 mg by mouth daily.    [name] zolpidem (AMBIEN) 10 MG tablet Take 10 mg by mouth at bedtime as needed for sleep.       Allergies  Allergen Reactions  . Sulfa Antibiotics Rash    Social History   Social History  . Marital Status: Married    Spouse  Name: N/A  . Number of Children: 2  . Years of Education: N/A   Occupational History  . Occupational hygienist     [employer]   Social History Main Topics  . Smoking status: Former Smoker -- 1.00 packs/day for 18 years    Types: Cigarettes    Quit date: 09/03/1987  . Smokeless tobacco: Not on file  . Alcohol Use: No  . Drug Use: No  . Sexual Activity: Not on file   Other Topics Concern  . Not on file   Social History Narrative    Family History  Problem Relation Age of Onset  . Heart attack Father 41  . Hyperlipidemia Father   . Diabetes Father   . Hypertension Father   . Fibromyalgia Sister   . Stroke Mother   . Arrhythmia Mother     atrial fib.    PHYSICAL EXAM: Filed Vitals:   12/02/15 0015 12/02/15 0139  BP: 127/61 163/69  Pulse: 42 41  Temp:    Resp: 15 16   General:  Well appearing. No respiratory difficulty HEENT: normal Neck: supple. no JVD. Carotids 2+ bilat; no bruits. No lymphadenopathy or thryomegaly appreciated. Cor: PMI nondisplaced. Regular rate & rhythm. No rubs, gallops or murmurs. Lungs: clear Abdomen: soft, nontender,  nondistended. No hepatosplenomegaly. No bruits or masses. Good bowel sounds. Extremities: no cyanosis, clubbing, rash, edema Neuro: alert & oriented x 3, cranial nerves grossly intact. moves all 4 extremities w/o difficulty. Affect pleasant.    Results for orders placed or performed during the hospital encounter of 12/01/15 (from the past 24 hour(s))  D[facility], [facility] (not [facility])     Status: Abnormal   Collection Time: 12/01/15  7:15 PM  Result Value Ref Range   D-Dimer, Quant 0.63 (H) 0.00 - 0.50 ug/mL-FEU  Basic metabolic panel     Status: Abnormal   Collection Time: 12/01/15  7:15 PM  Result Value Ref Range   Sodium 138 135 - 145 mmol/L   Potassium 4.1 3.5 - 5.1 mmol/L   Chloride 106 101 - 111 mmol/L   CO2 24 22 - 32 mmol/L   Glucose, Bld 102 (H) 65 - 99 mg/dL   BUN 17 6 - 20 mg/dL   Creatinine, Ser 1.321.29 (H)  0.61 - 1.24 mg/dL   Calcium 9.0 8.9 - 44.010.3 mg/dL   GFR calc non Af Amer 58 (L) >60 mL/min   GFR calc Af Amer >60 >60 mL/min   Anion gap 8 5 - 15  CBC with Differential/Platelet     Status: Abnormal   Collection Time: 12/01/15  7:15 PM  Result Value Ref Range   WBC 15.1 (H) 4.0 - 10.5 K/uL   RBC 4.86 4.22 - 5.81 MIL/uL   Hemoglobin 14.3 13.0 - 17.0 g/dL   HCT 10.242.7 72.539.0 - 36.652.0 %   MCV 87.9 78.0 - 100.0 fL   MCH 29.4 26.0 - 34.0 pg   MCHC 33.5 30.0 - 36.0 g/dL   RDW 44.013.7 34.711.5 - 42.515.5 %   Platelets 339 150 - 400 K/uL   Neutrophils Relative % 63 %   Lymphocytes Relative 30 %   Monocytes Relative 6 %   Eosinophils Relative 1 %   Basophils Relative 0 %   Band Neutrophils 0 %   Metamyelocytes Relative 0 %   Myelocytes 0 %   Promyelocytes Absolute 0 %   Blasts 0 %   nRBC 0 0 /100 WBC   Neutro Abs 9.5 (H) 1.7 - 7.7 K/uL   Lymphs Abs 4.5 (H) 0.7 - 4.0 K/uL   Monocytes Absolute 0.9 0.1 - 1.0 K/uL   Eosinophils Absolute 0.2 0.0 - 0.7 K/uL   Basophils Absolute 0.0 0.0 - 0.1 K/uL   Smear Review MORPHOLOGY UNREMARKABLE   Troponin I     Status: None   Collection Time: 12/01/15  7:15 PM  Result Value Ref Range   Troponin I <0.03 <0.031 ng/mL  Lithium level     Status: Abnormal   Collection Time: 12/01/15  8:15 PM  Result Value Ref Range   Lithium Lvl 0.48 (L) 0.60 - 1.20 mmol/L   Ct Angio Chest Pe W/cm &/or Wo Cm  12/01/2015  CLINICAL DATA:  Multiple episodes of intermittent moderate shortness of breath beginning 5 days ago, has been walking several miles 3 times a week, leukocytosis, D-dimer = 0.63, history of BILATERAL pulmonary embolism, former smoker EXAM: CT ANGIOGRAPHY CHEST WITH CONTRAST TECHNIQUE: Multidetector CT imaging of the chest was performed using the standard protocol during bolus administration of intravenous contrast. Multiplanar CT image reconstructions and MIPs were obtained to evaluate the vascular anatomy. CONTRAST:  75 cc Isovue 370 IV COMPARISON:  08/20/2010 FINDINGS:  Aorta normal caliber without aneurysm or dissection. Pulmonary arteries well opacified and patent. Scattered streak artifacts  at RIGHT upper lobe pulmonary artery centrally from dense contrast in SVC. No evidence of pulmonary embolism. No thoracic adenopathy. Nonspecific air within the thoracic esophagus. Intermediate attenuation mass at posterior aspect RIGHT lobe liver on last image, incompletely visualized, 3.0 x 2.1 cm, stable in size since 2011. Visualized upper abdomen otherwise unremarkable. 4 mm LEFT apex nodule unchanged. Minimal subsegmental atelectasis lingula. Remaining lungs clear. No pulmonary infiltrate, pleural effusion, or pneumothorax. Review of the MIP images confirms the above findings. IMPRESSION: No evidence of pulmonary embolism. Stable 4 mm LEFT apex nodule. Stable nonspecific intermediate attenuation lesion at posterior aspect RIGHT lobe liver unchanged since 2011, likely benign due to 5 years of stability ; this could represent a complicated cyst, hemangioma or other benign lesion. Electronically Signed   By: Ulyses Southward M.D.   On: 12/01/2015 21:30   Dg Chest Port 1 View  12/01/2015  CLINICAL DATA:  Shortness of breath and chest pain EXAM: PORTABLE CHEST 1 VIEW COMPARISON:  03/13/2012 chest radiograph. FINDINGS: Stable cardiomediastinal silhouette with top-normal heart size. No pneumothorax. No pleural effusion. Mild bibasilar scarring versus atelectasis. No acute consolidative airspace disease. No overt pulmonary edema. IMPRESSION: Mild bibasilar scarring versus atelectasis. Otherwise no active disease in the chest. Electronically Signed   By: Delbert Phenix M.D.   On: 12/01/2015 19:54     ASSESSMENT:  1. Complete AV block with symptomatic bradycardia - No obvious reversible cause - Not on AV nodal blockers - No significant electrolyte abnormalities - His more than normal sweating and SOB while walking about a week ago raise concern for angina equivalent and  CAD   PLAN/DISCUSSION:  Admit to cardiology  Monitor on tele Echo Plan for ischemia evaluation (possibly cath) Need pacemaker prior to discharge Continue ASA, statin Check TSH, Hba1c, lipids   Nevin Bloodgood, MD cardiology Patient seen and examined. Admitted with dyspnea on exertion. Initial electrocardiogram showed complete heart block. He is now in sinus bradycardia with first-degree AV block. He is on no AV nodal blocking agents and has not had chest pain. He will require a pacemaker. Check echocardiogram for LV function. Discussed with Dr. Ladona Ridgel. We will not pursue an ischemia evaluation unless LV function is reduced. Zoll pacemaker in place. Olga Millers

## 2015-12-03 ENCOUNTER — Inpatient Hospital Stay (HOSPITAL_COMMUNITY): Payer: 59

## 2015-12-03 DIAGNOSIS — R0600 Dyspnea, unspecified: Secondary | ICD-10-CM

## 2015-12-03 LAB — ECHOCARDIOGRAM COMPLETE
Height: 68 in
Weight: 3289.6 oz

## 2015-12-03 MED ORDER — SODIUM CHLORIDE 0.9 % IR SOLN
80.0000 mg | Status: AC
  Administered 2015-12-04: 80 mg
  Filled 2015-12-03: qty 2

## 2015-12-03 MED ORDER — CEFAZOLIN SODIUM-DEXTROSE 2-4 GM/100ML-% IV SOLN
2.0000 g | INTRAVENOUS | Status: AC
  Administered 2015-12-04: 2 g via INTRAVENOUS
  Filled 2015-12-03: qty 100

## 2015-12-03 MED ORDER — SODIUM CHLORIDE 0.9 % IV SOLN
INTRAVENOUS | Status: DC
  Administered 2015-12-04: 06:00:00 via INTRAVENOUS

## 2015-12-03 NOTE — Consult Note (Signed)
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 with c/p dyspnea on exertion for the past one week. Reports that a week ago while walking, he had more than normal sweating and developed SOB. No CP, palpitations, syncope. Felt dizzy and since has postural dizziness on standing along with ongoing dyspnea on exertion. No fever, chills, cough, orthopnea, PND, edema. No known CAD, MI, HF, CVA, DM. Nonsmoker.   ECG today showed Sinus rhythm in 90's with complete AV block and junctional escape at 43 bpm Second ECG by EMS today showed 2:1 AV block  ECG 04/11/12 PR 203 ms with SR rate in 70's  Trop <0.03, CTA chest no PE  Review of Systems:  10 systems reviewed unremarkable except as noted in HPI    Past Medical History  Diagnosis Date  . Pulmonary embolism (HCC) 10/2009  . Dyslipidemia   . Cystitis, acute   . Diverticulitis   . Lumbago   . Nephrolithiasis   . BPH (benign prostatic hypertrophy)   . Chest pain   . Dyspnea    No current facility-administered medications on file prior to encounter.   Current Outpatient Prescriptions on File Prior to Encounter  Medication Sig Dispense Refill  . aspirin 81 MG tablet Take 81 mg by mouth daily.     [name] atorvastatin (LIPITOR) 20 MG tablet Take 1 tablet by mouth Daily.    . fluticasone (FLONASE) 50 MCG/ACT nasal spray Place 2 sprays into the nose daily as needed. For allergies    . LamoTRIgine 50 MG TBDP Take 3 tablets by mouth daily.     . pramipexole (MIRAPEX) 1.5 MG tablet Take 1.5 mg by mouth daily.    [name] zolpidem (AMBIEN) 10 MG tablet Take 10 mg by mouth at bedtime as needed for sleep.       Allergies  Allergen Reactions  . Sulfa Antibiotics Rash     Social History   Social History  . Marital Status: Married    Spouse Name: N/A  . Number of Children: 2  . Years of Education: N/A   Occupational History  . Occupational hygienist     [employer]   Social History Main Topics  . Smoking status: Former Smoker -- 1.00 packs/day for 18 years    Types: Cigarettes    Quit date: 09/03/1987  . Smokeless tobacco: Not on file  . Alcohol Use: No  . Drug Use: No  . Sexual Activity: Not on file   Other Topics Concern  . Not on file   Social History Narrative    Family History  Problem Relation Age of Onset  . Heart attack Father 71  . Hyperlipidemia Father   . Diabetes Father   . Hypertension Father   . Fibromyalgia Sister   . Stroke Mother   . Arrhythmia Mother     atrial fib.    PHYSICAL EXAM: Filed Vitals:   12/02/15 0015 12/02/15 0139  BP: 127/61 163/69  Pulse: 42 41  Temp:    Resp: 15 16   General: Well appearing. No respiratory difficulty HEENT: normal Neck: supple. no JVD. Carotids 2+ bilat; no bruits. No lymphadenopathy or thryomegaly appreciated. Cor: PMI nondisplaced. Regular rate & rhythm. No rubs, gallops or murmurs. Lungs: clear Abdomen: soft, nontender, nondistended. No hepatosplenomegaly. No bruits  or masses. Good bowel sounds. Extremities: no cyanosis, clubbing, rash, edema Neuro: alert & oriented x 3, cranial nerves grossly intact. moves all 4 extremities w/o difficulty. Affect pleasant.     Lab Results Last 24 Hours    Results for orders placed or performed during the hospital encounter of 12/01/15 (from the past 24 hour(s))  D-dimer, quantitative (not at [facility]) Status: Abnormal   Collection Time: 12/01/15 7:15 PM  Result Value Ref Range   D-Dimer, Quant 0.63 (H) 0.00 - 0.50 ug/mL-FEU  Basic metabolic panel Status: Abnormal   Collection Time: 12/01/15  7:15 PM  Result Value Ref Range   Sodium 138 135 - 145 mmol/L   Potassium 4.1 3.5 - 5.1 mmol/L   Chloride 106 101 - 111 mmol/L   CO2 24 22 - 32 mmol/L   Glucose, Bld 102 (H) 65 - 99 mg/dL   BUN 17 6 - 20 mg/dL   Creatinine, Ser 9.52 (H) 0.61 - 1.24 mg/dL   Calcium 9.0 8.9 - 84.1 mg/dL   GFR calc non Af Amer 58 (L) >60 mL/min   GFR calc Af Amer >60 >60 mL/min   Anion gap 8 5 - 15  CBC with Differential/Platelet Status: Abnormal   Collection Time: 12/01/15 7:15 PM  Result Value Ref Range   WBC 15.1 (H) 4.0 - 10.5 K/uL   RBC 4.86 4.22 - 5.81 MIL/uL   Hemoglobin 14.3 13.0 - 17.0 g/dL   HCT 32.4 40.1 - 02.7 %   MCV 87.9 78.0 - 100.0 fL   MCH 29.4 26.0 - 34.0 pg   MCHC 33.5 30.0 - 36.0 g/dL   RDW 25.3 66.4 - 40.3 %   Platelets 339 150 - 400 K/uL   Neutrophils Relative % 63 %   Lymphocytes Relative 30 %   Monocytes Relative 6 %   Eosinophils Relative 1 %   Basophils Relative 0 %   Band Neutrophils 0 %   Metamyelocytes Relative 0 %   Myelocytes 0 %   Promyelocytes Absolute 0 %   Blasts 0 %   nRBC 0 0 /100 WBC   Neutro Abs 9.5 (H) 1.7 - 7.7 K/uL   Lymphs Abs 4.5 (H) 0.7 - 4.0 K/uL   Monocytes Absolute 0.9 0.1 - 1.0 K/uL   Eosinophils Absolute 0.2 0.0 - 0.7 K/uL   Basophils Absolute 0.0 0.0 - 0.1 K/uL   Smear Review MORPHOLOGY UNREMARKABLE   Troponin I Status: None   Collection Time: 12/01/15 7:15 PM  Result Value Ref Range   Troponin I <0.03 <0.031 ng/mL  Lithium level Status: Abnormal   Collection Time: 12/01/15 8:15 PM  Result Value Ref Range   Lithium Lvl 0.48 (L) 0.60 - 1.20 mmol/L      Imaging Results (Last 48 hours)    Ct Angio Chest Pe W/cm &/or Wo Cm  12/01/2015 CLINICAL DATA: Multiple episodes of intermittent moderate shortness of breath beginning 5 days ago, has been  walking several miles 3 times a week, leukocytosis, D-dimer = 0.63, history of BILATERAL pulmonary embolism, former smoker EXAM: CT ANGIOGRAPHY CHEST WITH CONTRAST TECHNIQUE: Multidetector CT imaging of the chest was performed using the standard protocol during bolus administration of intravenous contrast. Multiplanar CT image reconstructions and MIPs were obtained to evaluate the vascular anatomy. CONTRAST: 75 cc Isovue 370 IV COMPARISON: 08/20/2010 FINDINGS: Aorta normal caliber without aneurysm or dissection. Pulmonary arteries well opacified and patent. Scattered streak artifacts at RIGHT upper lobe pulmonary artery centrally from dense contrast in SVC. No evidence  of pulmonary embolism. No thoracic adenopathy. Nonspecific air within the thoracic esophagus. Intermediate attenuation mass at posterior aspect RIGHT lobe liver on last image, incompletely visualized, 3.0 x 2.1 cm, stable in size since 2011. Visualized upper abdomen otherwise unremarkable. 4 mm LEFT apex nodule unchanged. Minimal subsegmental atelectasis lingula. Remaining lungs clear. No pulmonary infiltrate, pleural effusion, or pneumothorax. Review of the MIP images confirms the above findings. IMPRESSION: No evidence of pulmonary embolism. Stable 4 mm LEFT apex nodule. Stable nonspecific intermediate attenuation lesion at posterior aspect RIGHT lobe liver unchanged since 2011, likely benign due to 5 years of stability ; this could represent a complicated cyst, hemangioma or other benign lesion. Electronically Signed By: Ulyses SouthwardMark Boles M.D. On: 12/01/2015 21:30   Dg Chest Port 1 View  12/01/2015 CLINICAL DATA: Shortness of breath and chest pain EXAM: PORTABLE CHEST 1 VIEW COMPARISON: 03/13/2012 chest radiograph. FINDINGS: Stable cardiomediastinal silhouette with top-normal heart size. No pneumothorax. No pleural effusion. Mild bibasilar scarring versus atelectasis. No acute consolidative airspace disease. No overt pulmonary edema.  IMPRESSION: Mild bibasilar scarring versus atelectasis. Otherwise no active disease in the chest. Electronically Signed By: Delbert PhenixJason A Poff M.D. On: 12/01/2015 19:54      ASSESSMENT:  1. Complete AV block with symptomatic bradycardia - No obvious reversible cause - Not on AV nodal blockers - No significant electrolyte abnormalities - preserved LV function with no segmental wall motion abnormalities - His more than normal sweating and SOB while walking about a week ago raise concern for angina equivalent and CAD   PLAN/DISCUSSION:  I have discussed the treatment options with the patient and have recommended proceeding with PPM insertion. The risk/benefits/goals/expectations of PPM insertion have been reviewed with the patient and he wishes to proceed.     Lewayne BuntingGregg Keyonna Comunale, M.D.

## 2015-12-03 NOTE — Progress Notes (Signed)
C/O feeling lightheaded. No other c/o expressed at this time. BP = 144/66, Spo2% = 97. HR continues in 3rd degree HB with Rate 42 to 46. Will cont to monitor.

## 2015-12-03 NOTE — Progress Notes (Signed)
    Subjective:  Denies CP or dyspnea   Objective:  Filed Vitals:   12/03/15 0500 12/03/15 0700 12/03/15 0720 12/03/15 0900  BP: 109/54     Pulse: 37 35 35   Temp: 98.6 F (37 C)   98.2 F (36.8 C)  TempSrc: Oral   Oral  Resp: 14 18 12    Height:      Weight:      SpO2: 95% 96% 97%     Intake/Output from previous day:  Intake/Output Summary (Last 24 hours) at 12/03/15 0953 Last data filed at 12/03/15 0500  Gross per 24 hour  Intake    600 ml  Output   1250 ml  Net   -650 ml    Physical Exam: Physical exam: Well-developed well-nourished in no acute distress.  Skin is warm and dry.  HEENT is normal.  Neck is supple.  Chest is clear to auscultation with normal expansion.  Cardiovascular exam is bradycardic Abdominal exam nontender or distended. No masses palpated. Extremities show no edema. neuro grossly intact    Lab Results: Basic Metabolic Panel:  Recent Labs  16/06/9602/31/17 1915 12/02/15 0212  NA 138  --   K 4.1  --   CL 106  --   CO2 24  --   GLUCOSE 102*  --   BUN 17  --   CREATININE 1.29* 1.13  CALCIUM 9.0  --    CBC:  Recent Labs  12/01/15 1915 12/02/15 0212  WBC 15.1* 9.6  NEUTROABS 9.5*  --   HGB 14.3 13.4  HCT 42.7 41.2  MCV 87.9 87.3  PLT 339 294   Cardiac Enzymes:  Recent Labs  12/01/15 1915 12/02/15 0212 12/02/15 0748  TROPONINI <0.03 <0.03 <0.03     Assessment/Plan:  1 complete heart block-telemetry has been personally reviewed. He has episodes of complete heart block with heart rates in the 30s. At present he is in sinus rhythm with first-degree AV block and heart rate 40. He denies dyspnea at rest, chest pain or syncope. Await echocardiogram to assess LV function. If reduced he will require cardiac catheterization. Otherwise plan permanent pacemaker tomorrow. Dr. Ladona Ridgelaylor is aware. Zoll pacemaker in place. 2 Hyperlipidemia-continue statin. 3 bipolar disorder  Sean MillersBrian Winters 12/03/2015, 9:53 AM

## 2015-12-03 NOTE — Progress Notes (Signed)
  Echocardiogram 2D Echocardiogram has been performed.  Arvil ChacoFoster, Kensey Luepke 12/03/2015, 11:43 AM

## 2015-12-04 ENCOUNTER — Other Ambulatory Visit (HOSPITAL_COMMUNITY): Payer: Managed Care, Other (non HMO)

## 2015-12-04 ENCOUNTER — Encounter (HOSPITAL_COMMUNITY): Payer: Self-pay | Admitting: Internal Medicine

## 2015-12-04 ENCOUNTER — Encounter (HOSPITAL_COMMUNITY)
Admission: EM | Disposition: A | Discharge status: Home / Self Care | Payer: Self-pay | Source: Home / Self Care | Attending: Cardiology

## 2015-12-04 DIAGNOSIS — I442 Atrioventricular block, complete: Secondary | ICD-10-CM

## 2015-12-04 HISTORY — PX: EP IMPLANTABLE DEVICE: SHX172B

## 2015-12-04 LAB — HEMOGLOBIN A1C
Hgb A1c MFr Bld: 5.8 % — ABNORMAL HIGH (ref 4.8–5.6)
Mean Plasma Glucose: 120 mg/dL

## 2015-12-04 LAB — SURGICAL PCR SCREEN
MRSA, PCR: NEGATIVE
Staphylococcus aureus: NEGATIVE

## 2015-12-04 LAB — GLUCOSE, CAPILLARY: Glucose-Capillary: 82 mg/dL (ref 65–99)

## 2015-12-04 SURGERY — PACEMAKER IMPLANT
Anesthesia: LOCAL

## 2015-12-04 MED ORDER — FENTANYL CITRATE (PF) 100 MCG/2ML IJ SOLN
INTRAMUSCULAR | Status: DC | PRN
  Administered 2015-12-04 (×2): 25 ug via INTRAVENOUS
  Administered 2015-12-04 (×2): 12.5 ug via INTRAVENOUS

## 2015-12-04 MED ORDER — HEPARIN (PORCINE) IN NACL 2-0.9 UNIT/ML-% IJ SOLN
INTRAMUSCULAR | Status: DC | PRN
  Administered 2015-12-04: 11:00:00

## 2015-12-04 MED ORDER — MIDAZOLAM HCL 5 MG/5ML IJ SOLN
INTRAMUSCULAR | Status: DC | PRN
  Administered 2015-12-04 (×4): 1 mg via INTRAVENOUS
  Administered 2015-12-04: 2 mg via INTRAVENOUS

## 2015-12-04 MED ORDER — FENTANYL CITRATE (PF) 100 MCG/2ML IJ SOLN
INTRAMUSCULAR | Status: AC
  Filled 2015-12-04: qty 2

## 2015-12-04 MED ORDER — MIDAZOLAM HCL 5 MG/5ML IJ SOLN
INTRAMUSCULAR | Status: AC
  Filled 2015-12-04: qty 5

## 2015-12-04 MED ORDER — ONDANSETRON HCL 4 MG/2ML IJ SOLN: 4.0000 mg | Freq: Four times a day (QID) | INTRAMUSCULAR | Status: DC | PRN

## 2015-12-04 MED ORDER — ACETAMINOPHEN 325 MG PO TABS
325.0000 mg | ORAL_TABLET | ORAL | Status: DC | PRN
  Administered 2015-12-05: 650 mg via ORAL
  Filled 2015-12-04: qty 2

## 2015-12-04 MED ORDER — HEPARIN (PORCINE) IN NACL 2-0.9 UNIT/ML-% IJ SOLN
INTRAMUSCULAR | Status: AC
  Filled 2015-12-04: qty 500

## 2015-12-04 MED ORDER — CEFAZOLIN SODIUM 1-5 GM-% IV SOLN
1.0000 g | Freq: Four times a day (QID) | INTRAVENOUS | Status: AC
  Administered 2015-12-04 – 2015-12-05 (×3): 1 g via INTRAVENOUS
  Filled 2015-12-04 (×4): qty 50

## 2015-12-04 MED ORDER — CEFAZOLIN SODIUM-DEXTROSE 2-3 GM-% IV SOLR
INTRAVENOUS | Status: AC
  Filled 2015-12-04: qty 50

## 2015-12-04 MED ORDER — LITHIUM CARBONATE ER 450 MG PO TBCR: 900.0000 mg | EXTENDED_RELEASE_TABLET | Freq: Every day | ORAL | Status: DC

## 2015-12-04 MED ORDER — LAMOTRIGINE 25 MG PO TABS: 75.0000 mg | ORAL_TABLET | Freq: Every day | ORAL | Status: DC

## 2015-12-04 MED ORDER — LIDOCAINE HCL (PF) 1 % IJ SOLN
INTRAMUSCULAR | Status: AC
  Filled 2015-12-04: qty 60

## 2015-12-04 MED ORDER — LIDOCAINE HCL (PF) 1 % IJ SOLN
INTRAMUSCULAR | Status: DC | PRN
  Administered 2015-12-04: 31 mL via INTRADERMAL

## 2015-12-04 MED ORDER — ASPIRIN EC 81 MG PO TBEC: 81.0000 mg | DELAYED_RELEASE_TABLET | Freq: Every day | ORAL | Status: DC

## 2015-12-04 MED ORDER — SODIUM CHLORIDE 0.9 % IR SOLN
Status: AC
  Filled 2015-12-04: qty 2

## 2015-12-04 SURGICAL SUPPLY — 8 items
CABLE SURGICAL S-101-97-12 (CABLE) ×2 IMPLANT
GUIDEWIRE ANGLED .035X150CM (WIRE) ×2 IMPLANT
LEAD CAPSURE NOVUS 5076-52CM (Lead) ×2 IMPLANT
LEAD CAPSURE NOVUS 5076-58CM (Lead) ×2 IMPLANT
PAD DEFIB LIFELINK (PAD) ×2 IMPLANT
PPM ADVISA MRI DR A2DR01 (Pacemaker) ×2 IMPLANT
SHEATH CLASSIC 7F (SHEATH) ×4 IMPLANT
TRAY PACEMAKER INSERTION (PACKS) ×2 IMPLANT

## 2015-12-04 NOTE — Progress Notes (Signed)
Orthopedic Tech Progress Note Patient Details:  Tye SavoyGregory D Junker 07/25/1956 [id number]  Ortho Devices Type of Ortho Device: Arm sling   Saul FordyceJennifer C Nahome Bublitz 12/04/2015, 1:28 PM

## 2015-12-04 NOTE — Progress Notes (Signed)
SUBJECTIVE: The patient is doing well today.  At this time, he denies chest pain, shortness of breath, or any new concerns.  [name] aspirin EC  81 mg Oral Daily  . atorvastatin  20 mg Oral q1800  .  ceFAZolin (ANCEF) IV  2 g Intravenous To Cath  . enoxaparin (LOVENOX) injection  40 mg Subcutaneous Daily  . gentamicin irrigation  80 mg Irrigation To Cath  . lamoTRIgine  75 mg Oral Daily  . lithium carbonate  900 mg Oral QHS  . pramipexole  1.5 mg Oral QHS   . sodium chloride 50 mL/hr at 12/04/15 0617    OBJECTIVE: Physical Exam: Filed Vitals:   12/03/15 1733 12/03/15 2230 12/04/15 0550 12/04/15 0554  BP: 147/61 123/54 125/54   Pulse: 42 30    Temp: 98.2 F (36.8 C) 97.4 F (36.3 C) 97.5 F (36.4 C)   TempSrc: Oral Oral Oral   Resp:  12 22   Height:      Weight:    204 lb 8 oz (92.761 kg)  SpO2: 98% 98% 99%     Intake/Output Summary (Last 24 hours) at 12/04/15 0847 Last data filed at 12/04/15 0553  Gross per 24 hour  Intake      0 ml  Output    875 ml  Net   -875 ml    Telemetry reveals SR with variable heart block, second degree type I , 2:1 heart block and CHB, 30's-40's  GEN- The patient is well appearing, alert and oriented x 3 today.   Head- normocephalic, atraumatic Eyes-  Sclera clear, conjunctiva pink Ears- hearing intact Oropharynx- clear Neck- supple, no JVP Lungs- Clear to ausculation bilaterally, normal work of breathing Heart- Regular rate and rhythm, bradycardic, no significant murmurs, no rubs or gallops GI- soft, NT, ND Extremities- no clubbing, cyanosis, or edema Skin- no rash or lesion Psych- euthymic mood, full affect Neuro- no gross deficits appreciated  LABS: Basic Metabolic Panel:  Recent Labs  16/10/96 1915 12/02/15 0212  NA 138  --   K 4.1  --   CL 106  --   CO2 24  --   GLUCOSE 102*  --   BUN 17  --   CREATININE 1.29* 1.13  CALCIUM 9.0  --    CBC:  Recent Labs  12/01/15 1915 12/02/15 0212  WBC 15.1* 9.6  NEUTROABS  9.5*  --   HGB 14.3 13.4  HCT 42.7 41.2  MCV 87.9 87.3  PLT 339 294   Cardiac Enzymes:  Recent Labs  12/01/15 1915 12/02/15 0212 12/02/15 0748  TROPONINI <0.03 <0.03 <0.03   D-Dimer:  Recent Labs  12/01/15 1915  DDIMER 0.63*   Fasting Lipid Panel:  Recent Labs  12/02/15 0212  CHOL 145  HDL 38*  LDLCALC 94  TRIG 64  CHOLHDL 3.8   Thyroid Function Tests:  Recent Labs  12/02/15 0212  TSH 4.063     ASSESSMENT AND PLAN:  1. Complete AV block with symptomatic bradycardia - No obvious reversible cause - Not on AV nodal blockers - No significant electrolyte abnormalities - preserved LV function with no segmental wall motion abnormalities - WBC has normalized to 9.6, afebrile, no symptoms of illness  Planned for PPM implant today with Dr. Ladona Ridgel, Dr. Ladona Ridgel discussed risk/benefits with the patient additional questions answered this morning, he remains agreeable to proceed  2. - His more than normal sweating and SOB while walking about a week ago raise concern for angina equivalent  and CAD     Ischemic evaluation is deferred to primary cardiology, not planned for at this time with normal LVEF and no WMA.   Francis DowseRenee Ursuy, PA-C 12/04/2015 8:47 AM   EP Attending  Patient seen and examined. Agree with above. For PPM insertion.   Leonia ReevesGregg Taylor,M.D.

## 2015-12-04 NOTE — H&P (View-Only) (Signed)
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 Reports that a week ago while walking, he had more than normal sweating and developed SOB. No CP, palpitations, syncope. Felt dizzy and since has postural dizziness on standing along with ongoing dyspnea on exertion. No fever, chills, cough, orthopnea, PND, edema. No known CAD, MI, HF, CVA, DM. Nonsmoker.   ECG today showed Sinus rhythm in 90's with complete AV block and junctional escape at 43 bpm Second ECG by EMS today showed 2:1 AV block  ECG 04/11/12 PR 203 ms with SR rate in 70's  Trop <0.03, CTA chest no PE  Review of Systems:  10 systems reviewed unremarkable except as noted in HPI    Past Medical History  Diagnosis Date  . Pulmonary embolism (HCC) 10/2009  . Dyslipidemia   . Cystitis, acute   . Diverticulitis   . Lumbago   . Nephrolithiasis   . BPH (benign prostatic hypertrophy)   . Chest pain   . Dyspnea    No current facility-administered medications on file prior to encounter.   Current Outpatient Prescriptions on File Prior to Encounter  Medication Sig Dispense Refill  . aspirin 81 MG tablet Take 81 mg by mouth daily.     . atorvastatin (LIPITOR) 20 MG tablet Take 1 tablet by mouth Daily.    . fluticasone (FLONASE) 50 MCG/ACT nasal spray Place 2 sprays into the nose daily as needed. For allergies    . LamoTRIgine 50 MG TBDP Take 3 tablets by mouth daily.     . pramipexole (MIRAPEX) 1.5 MG tablet Take 1.5 mg by mouth daily.    . zolpidem (AMBIEN) 10 MG tablet Take 10 mg by mouth at bedtime as needed for sleep.       Allergies  Allergen Reactions  . Sulfa Antibiotics Rash     Social History   Social History  . Marital Status: Married    Spouse Name: N/A  . Number of Children: 2  . Years of Education: N/A   Occupational History  . logistics manager     [employer]   Social History Main Topics  . Smoking status: Former Smoker -- 1.00 packs/day for 18 years    Types: Cigarettes    Quit date: 09/03/1987  . Smokeless tobacco: Not on file  . Alcohol Use: No  . Drug Use: No  . Sexual Activity: Not on file   Other Topics Concern  . Not on file   Social History Narrative    Family History  Problem Relation Age of Onset  . Heart attack Father 50  . Hyperlipidemia Father   . Diabetes Father   . Hypertension Father   . Fibromyalgia Sister   . Stroke Mother   . Arrhythmia Mother     atrial fib.    PHYSICAL EXAM: Filed Vitals:   12/02/15 0015 12/02/15 0139  BP: 127/61 163/69  Pulse: 42 41  Temp:    Resp: 15 16   General: Well appearing. No respiratory difficulty HEENT: normal Neck: supple. no JVD. Carotids 2+ bilat; no bruits. No lymphadenopathy or thryomegaly appreciated. Cor: PMI nondisplaced. Regular rate & rhythm. No rubs, gallops or murmurs. Lungs: clear Abdomen: soft, nontender, nondistended. No hepatosplenomegaly. No bruits   or masses. Good bowel sounds. Extremities: no cyanosis, clubbing, rash, edema Neuro: alert & oriented x 3, cranial nerves grossly intact. moves all 4 extremities w/o difficulty. Affect pleasant.     Lab Results Last 24 Hours    Results for orders placed or performed during the hospital encounter of 12/01/15 (from the past 24 hour(s))  D-dimer, quantitative (not at [facility]) Status: Abnormal   Collection Time: 12/01/15 7:15 PM  Result Value Ref Range   D-Dimer, Quant 0.63 (H) 0.00 - 0.50 ug/mL-FEU  Basic metabolic panel Status: Abnormal   Collection Time: 12/01/15  7:15 PM  Result Value Ref Range   Sodium 138 135 - 145 mmol/L   Potassium 4.1 3.5 - 5.1 mmol/L   Chloride 106 101 - 111 mmol/L   CO2 24 22 - 32 mmol/L   Glucose, Bld 102 (H) 65 - 99 mg/dL   BUN 17 6 - 20 mg/dL   Creatinine, Ser 1.29 (H) 0.61 - 1.24 mg/dL   Calcium 9.0 8.9 - 10.3 mg/dL   GFR calc non Af Amer 58 (L) >60 mL/min   GFR calc Af Amer >60 >60 mL/min   Anion gap 8 5 - 15  CBC with Differential/Platelet Status: Abnormal   Collection Time: 12/01/15 7:15 PM  Result Value Ref Range   WBC 15.1 (H) 4.0 - 10.5 K/uL   RBC 4.86 4.22 - 5.81 MIL/uL   Hemoglobin 14.3 13.0 - 17.0 g/dL   HCT 42.7 39.0 - 52.0 %   MCV 87.9 78.0 - 100.0 fL   MCH 29.4 26.0 - 34.0 pg   MCHC 33.5 30.0 - 36.0 g/dL   RDW 13.7 11.5 - 15.5 %   Platelets 339 150 - 400 K/uL   Neutrophils Relative % 63 %   Lymphocytes Relative 30 %   Monocytes Relative 6 %   Eosinophils Relative 1 %   Basophils Relative 0 %   Band Neutrophils 0 %   Metamyelocytes Relative 0 %   Myelocytes 0 %   Promyelocytes Absolute 0 %   Blasts 0 %   nRBC 0 0 /100 WBC   Neutro Abs 9.5 (H) 1.7 - 7.7 K/uL   Lymphs Abs 4.5 (H) 0.7 - 4.0 K/uL   Monocytes Absolute 0.9 0.1 - 1.0 K/uL   Eosinophils Absolute 0.2 0.0 - 0.7 K/uL   Basophils Absolute 0.0 0.0 - 0.1 K/uL   Smear Review MORPHOLOGY UNREMARKABLE   Troponin I Status: None   Collection Time: 12/01/15 7:15 PM  Result Value Ref Range   Troponin I <0.03 <0.031 ng/mL  Lithium level Status: Abnormal   Collection Time: 12/01/15 8:15 PM  Result Value Ref Range   Lithium Lvl 0.48 (L) 0.60 - 1.20 mmol/L      Imaging Results (Last 48 hours)    Ct Angio Chest Pe W/cm &/or Wo Cm  12/01/2015 CLINICAL DATA: Multiple episodes of intermittent moderate shortness of breath beginning 5 days ago, has been  walking several miles 3 times a week, leukocytosis, D-dimer = 0.63, history of BILATERAL pulmonary embolism, former smoker EXAM: CT ANGIOGRAPHY CHEST WITH CONTRAST TECHNIQUE: Multidetector CT imaging of the chest was performed using the standard protocol during bolus administration of intravenous contrast. Multiplanar CT image reconstructions and MIPs were obtained to evaluate the vascular anatomy. CONTRAST: 75 cc Isovue 370 IV COMPARISON: 08/20/2010 FINDINGS: Aorta normal caliber without aneurysm or dissection. Pulmonary arteries well opacified and patent. Scattered streak artifacts at RIGHT upper lobe pulmonary artery centrally from dense contrast in SVC. No evidence   of pulmonary embolism. No thoracic adenopathy. Nonspecific air within the thoracic esophagus. Intermediate attenuation mass at posterior aspect RIGHT lobe liver on last image, incompletely visualized, 3.0 x 2.1 cm, stable in size since 2011. Visualized upper abdomen otherwise unremarkable. 4 mm LEFT apex nodule unchanged. Minimal subsegmental atelectasis lingula. Remaining lungs clear. No pulmonary infiltrate, pleural effusion, or pneumothorax. Review of the MIP images confirms the above findings. IMPRESSION: No evidence of pulmonary embolism. Stable 4 mm LEFT apex nodule. Stable nonspecific intermediate attenuation lesion at posterior aspect RIGHT lobe liver unchanged since 2011, likely benign due to 5 years of stability ; this could represent a complicated cyst, hemangioma or other benign lesion. Electronically Signed By: Mark Boles M.D. On: 12/01/2015 21:30   Dg Chest Port 1 View  12/01/2015 CLINICAL DATA: Shortness of breath and chest pain EXAM: PORTABLE CHEST 1 VIEW COMPARISON: 03/13/2012 chest radiograph. FINDINGS: Stable cardiomediastinal silhouette with top-normal heart size. No pneumothorax. No pleural effusion. Mild bibasilar scarring versus atelectasis. No acute consolidative airspace disease. No overt pulmonary edema.  IMPRESSION: Mild bibasilar scarring versus atelectasis. Otherwise no active disease in the chest. Electronically Signed By: Jason A Poff M.D. On: 12/01/2015 19:54      ASSESSMENT:  1. Complete AV block with symptomatic bradycardia - No obvious reversible cause - Not on AV nodal blockers - No significant electrolyte abnormalities - preserved LV function with no segmental wall motion abnormalities - His more than normal sweating and SOB while walking about a week ago raise concern for angina equivalent and CAD   PLAN/DISCUSSION:  I have discussed the treatment options with the patient and have recommended proceeding with PPM insertion. The risk/benefits/goals/expectations of PPM insertion have been reviewed with the patient and he wishes to proceed.     Keeshia Sanderlin, M.D. 

## 2015-12-04 NOTE — Interval H&P Note (Signed)
History and Physical Interval Note:  12/04/2015 10:08 AM  Sean Winters  has presented today for surgery, with the diagnosis of CHB  The various methods of treatment have been discussed with the patient and family. After consideration of risks, benefits and other options for treatment, the patient has consented to  Procedure(s): Pacemaker Implant (N/A) as a surgical intervention .  The patient's history has been reviewed, patient examined, no change in status, stable for surgery.  I have reviewed the patient's chart and labs.  Questions were answered to the patient's satisfaction.     Sean Winters,M.D.

## 2015-12-05 ENCOUNTER — Inpatient Hospital Stay (HOSPITAL_COMMUNITY): Payer: 59

## 2015-12-05 NOTE — Progress Notes (Signed)
Patient ambulating in the room, vital signs WNL, Patient being discharged per MD order. All discharge instructions given to patient and family at bedside. A copy of all instructions given to patient.

## 2015-12-05 NOTE — Discharge Summary (Signed)
ELECTROPHYSIOLOGY PROCEDURE DISCHARGE SUMMARY    Patient ID: Sean Winters,  MRN: [id number], DOB/AGE: [birth year] 60 y.o.  Admit date: 12/01/2015 Discharge date: 12/05/2015  Primary Care Physician: [name], MD Primary Cardiologist: Dr. Jens Som (new) Electrophysiologist: Dr. Ladona Ridgel  Primary Discharge Diagnosis:  CHB status post pacemaker implantation this admission  Secondary Discharge Diagnosis:  1. Bipolar disorder  Allergies  Allergen Reactions  . Ibuprofen Other (See Comments)    Cannot take while on lithium  . Sulfa Antibiotics Rash     Procedures This Admission:  1.  Implantation of a  MDT dual chamber PPM on 12/04/15 by Dr Ladona Ridgel.  The patient received a Medtronic (serial number [id number]) pacemaker, with Medtronic [id number] (serial number [id number]) right atrial lead and a Medtronic [id number] (serial number [id number]) right ventricular lead.  There were no immediate post procedure complications. 2.  CXR on 12/05/15 demonstrated no pneumothorax status post device implantation.   Brief HPI: Sean Winters is a 60 y.o. male was admitted to [facility] 12/02/15 with increasing fatigue, DOE, no c/o CP, no syncope, noted to have variable heart block including CHB, he was admitted for further care and management.   Hospital Course:  The patient was admitted and r/o for ACS with negative troponins, he had an echo cardiogram with normal LVEF and no WMA, and decided that no ischemic w/u needed to be pursued and that his symptoms were secondary to his bradycardia and high degree heart block. His BP remained stable.  He had no reversible causes for his bradycardia/CHB and underwent implantation of a PPM with details as outlined above.  He  was monitored on telemetry overnight which demonstrated SR with V pacing.  Left chest was without hematoma or ecchymosis.  The device was interrogated and found to be functioning normally.  CXR was obtained and demonstrated no pneumothorax status post  device implantation.  Wound care, arm mobility, and restrictions were reviewed with the patient.  The patient was examined by Dr. Ladona Ridgel and considered stable for discharge to home.    Physical Exam: Filed Vitals:   12/04/15 2015 12/05/15 0000 12/05/15 0414 12/05/15 0807  BP: 122/60 120/65 112/71 134/74  Pulse: 76 71    Temp: 98.4 F (36.9 C) 98.4 F (36.9 C) 98.6 F (37 C) 98.6 F (37 C)  TempSrc: Oral Oral Oral Oral  Resp: Height:      Weight:   206 lb (93.441 kg)   SpO2: 97% 94% 96% 96%     GEN- The patient is well appearing, alert and oriented x 3 today.   HEENT: normocephalic, atraumatic; sclera clear, conjunctiva pink; hearing intact; oropharynx clear; neck supple, no JVP Lungs- Clear to ausculation bilaterally, normal work of breathing.  No wheezes, rales, rhonchi Heart- Regular rate and rhythm, no murmurs, rubs or gallops, PMI not laterally displaced GI- soft, non-tender, non-distended Extremities- no clubbing, cyanosis, or edema MS- no significant deformity or atrophy Skin- warm and dry, no rash or lesion, left chest without hematoma/ecchymosis Psych- euthymic mood, full affect Neuro- no gross deficits   Labs:   Lab Results  Component Value Date   WBC 9.6 12/02/2015   HGB 13.4 12/02/2015   HCT 41.2 12/02/2015   MCV 87.3 12/02/2015   PLT 294 12/02/2015     Recent Labs Lab 12/01/15 1915 12/02/15 0212  NA 138  --   K 4.1  --   CL 106  --   CO2 24  --  BUN 17  --   CREATININE 1.29* 1.13  CALCIUM 9.0  --   GLUCOSE 102*  --     Discharge Medications:    Medication List    TAKE these medications        aspirin EC 81 MG tablet  Take 81 mg by mouth daily.     atorvastatin 20 MG tablet  Commonly known as:  LIPITOR  Take 20 mg by mouth Daily.     fluticasone 50 MCG/ACT nasal spray  Commonly known as:  FLONASE  Place 2 sprays into the nose daily as needed (seasonal allergies). For allergies     lamoTRIgine 25 MG tablet  Commonly  known as:  LAMICTAL  Take 75 mg by mouth daily.     lithium carbonate 300 MG CR tablet  Commonly known as:  LITHOBID  Take 900 mg by mouth at bedtime.     MELATONIN PO  Take 1 tablet by mouth at bedtime.     OVER THE COUNTER MEDICATION  Take 3 capsules by mouth daily. CocoaVia- for memory and blood flow     OVER THE COUNTER MEDICATION  Take 2 tablets by mouth daily. For memory - Sensoril 250 mg tablets     pramipexole 1.5 MG tablet  Commonly known as:  MIRAPEX  Take 1.5 mg by mouth at bedtime.     zolpidem 10 MG tablet  Commonly known as:  AMBIEN  Take 10 mg by mouth at bedtime.        Disposition: Home Discharge Instructions    Diet - low sodium heart healthy    Complete by:  As directed      Increase activity slowly    Complete by:  As directed           Follow-up Information    [facility] [address] [facility] On 12[facility]14/2015.   Specialty:  Cardiology   Why:  9:30AM wound check   Contact information:   [address], [address] [place] [id number] [phone]      Follow up with Lewayne BuntingGregg Taylor, MD On 03/06/2016.   Specialty:  Cardiology   Why:  9:45AM   Contact information:   [address] [place] [address] [phone]       Duration of Discharge Encounter: Greater than 30 minutes including physician time.  Norma FredricksonSigned, Renee Ursuy, PA-C 12/05/2015 8:23 AM  EP Attending   Patient seen and examined. Agree with above. His CXR and device interogation are normal. He is stable for dc home. Usual followup.   Leonia ReevesGregg Taylor,M.D.

## 2015-12-05 NOTE — Discharge Instructions (Signed)
° ° °  Supplemental Discharge Instructions for  Pacemaker/Defibrillator Patients  Activity No heavy lifting or vigorous activity with your left/right arm for 6 to 8 weeks.  Do not raise your left/right arm above your head for one week.  Gradually raise your affected arm as drawn below.              12/09/15                      12/10/15                      12/11/15                    12/12/15 __  NO DRIVING for  1 week   ; you may begin driving on  1/61/094/11/17   .  WOUND CARE - Keep the wound area clean and dry.  Do not get this area wet for one week. No showers for one week; you may shower on  12/12/15   . - The tape/steri-strips on your wound will fall off; do not pull them off.  No bandage is needed on the site.  DO  NOT apply any creams, oils, or ointments to the wound area. - If you notice any drainage or discharge from the wound, any swelling or bruising at the site, or you develop a fever > 101? F after you are discharged home, call the office at once.  Special Instructions - You are still able to use cellular telephones; use the ear opposite the side where you have your pacemaker/defibrillator.  Avoid carrying your cellular phone near your device. - When traveling through airports, show security personnel your identification card to avoid being screened in the metal detectors.  Ask the security personnel to use the hand wand. - Avoid arc welding equipment, MRI testing (magnetic resonance imaging), TENS units (transcutaneous nerve stimulators).  Call the office for questions about other devices. - Avoid electrical appliances that are in poor condition or are not properly grounded. - Microwave ovens are safe to be near or to operate.  Additional information for defibrillator patients should your device go off: - If your device goes off ONCE and you feel fine afterward, notify the device clinic nurses. - If your device goes off ONCE and you do not feel well afterward, call 911. - If your device  goes off TWICE, call 911. - If your device goes off THREE times in one day, call 911.  DO NOT DRIVE YOURSELF OR A FAMILY MEMBER WITH A DEFIBRILLATOR TO THE HOSPITAL--CALL 911.

## 2015-12-14 ENCOUNTER — Ambulatory Visit (INDEPENDENT_AMBULATORY_CARE_PROVIDER_SITE_OTHER): Payer: 59 | Admitting: *Deleted

## 2015-12-14 ENCOUNTER — Encounter: Payer: Self-pay | Admitting: Internal Medicine

## 2015-12-14 DIAGNOSIS — Z950 Presence of cardiac pacemaker: Secondary | ICD-10-CM | POA: Diagnosis not present

## 2015-12-14 LAB — CUP PACEART INCLINIC DEVICE CHECK
Battery Voltage: 3.08 V
Brady Statistic AP VP Percent: 4.56 %
Brady Statistic AP VS Percent: 0.01 %
Brady Statistic AS VP Percent: 95.41 %
Brady Statistic AS VS Percent: 0.02 %
Brady Statistic RA Percent Paced: 4.57 %
Brady Statistic RV Percent Paced: 99.97 %
Date Time Interrogation Session: 20170413094027
Implantable Lead Implant Date: 20170403
Implantable Lead Implant Date: 20170403
Implantable Lead Location: 753859
Implantable Lead Location: 753860
Implantable Lead Model: 5076
Implantable Lead Model: 5076
Lead Channel Impedance Value: 380 Ohm
Lead Channel Impedance Value: 418 Ohm
Lead Channel Impedance Value: 513 Ohm
Lead Channel Impedance Value: 513 Ohm
Lead Channel Pacing Threshold Amplitude: 0.75 V
Lead Channel Pacing Threshold Amplitude: 1 V
Lead Channel Pacing Threshold Pulse Width: 0.4 ms
Lead Channel Pacing Threshold Pulse Width: 0.4 ms
Lead Channel Sensing Intrinsic Amplitude: 14.25 mV
Lead Channel Sensing Intrinsic Amplitude: 5.125 mV
Lead Channel Setting Pacing Amplitude: 3.5 V
Lead Channel Setting Pacing Amplitude: 3.5 V
Lead Channel Setting Pacing Pulse Width: 0.4 ms
Lead Channel Setting Sensing Sensitivity: 2 mV

## 2015-12-14 NOTE — Progress Notes (Signed)
Wound check appointment. Steri-strips removed. Wound without redness or edema. Incision edges approximated, wound well healed. Normal device function. Thresholds, sensing, and impedances consistent with implant measurements. Device programmed at 3.5V/auto capture programmed on for extra safety margin until 3 month visit. Histogram distribution appropriate for patient and level of activity. No mode switches or high ventricular rates noted. Patient educated about wound care, arm mobility, lifting restrictions. ROV with GT on 7/5.

## 2016-03-06 ENCOUNTER — Encounter: Payer: Self-pay | Admitting: Internal Medicine

## 2016-03-06 ENCOUNTER — Encounter (INDEPENDENT_AMBULATORY_CARE_PROVIDER_SITE_OTHER): Payer: Self-pay

## 2016-03-06 ENCOUNTER — Ambulatory Visit (INDEPENDENT_AMBULATORY_CARE_PROVIDER_SITE_OTHER): Payer: 59 | Admitting: Internal Medicine

## 2016-03-06 VITALS — BP 136/82 | HR 74 | Ht 68.0 in | Wt 210.2 lb

## 2016-03-06 DIAGNOSIS — I442 Atrioventricular block, complete: Secondary | ICD-10-CM | POA: Diagnosis not present

## 2016-03-06 LAB — CUP PACEART INCLINIC DEVICE CHECK
Battery Remaining Longevity: 113 mo
Battery Voltage: 3.03 V
Brady Statistic AP VP Percent: 1.37 %
Brady Statistic AP VS Percent: 0.01 %
Brady Statistic AS VP Percent: 98.48 %
Brady Statistic AS VS Percent: 0.14 %
Brady Statistic RA Percent Paced: 1.38 %
Brady Statistic RV Percent Paced: 99.85 %
Date Time Interrogation Session: 20170705101005
Implantable Lead Implant Date: 20170403
Implantable Lead Implant Date: 20170403
Implantable Lead Location: 753859
Implantable Lead Location: 753860
Implantable Lead Model: 5076
Implantable Lead Model: 5076
Lead Channel Impedance Value: 361 Ohm
Lead Channel Impedance Value: 418 Ohm
Lead Channel Impedance Value: 475 Ohm
Lead Channel Impedance Value: 494 Ohm
Lead Channel Pacing Threshold Amplitude: 0.625 V
Lead Channel Pacing Threshold Amplitude: 1.25 V
Lead Channel Pacing Threshold Pulse Width: 0.4 ms
Lead Channel Pacing Threshold Pulse Width: 0.4 ms
Lead Channel Sensing Intrinsic Amplitude: 13.625 mV
Lead Channel Sensing Intrinsic Amplitude: 4 mV
Lead Channel Setting Pacing Amplitude: 2 V
Lead Channel Setting Pacing Amplitude: 2.5 V
Lead Channel Setting Pacing Pulse Width: 0.4 ms
Lead Channel Setting Sensing Sensitivity: 2 mV

## 2016-03-06 NOTE — Patient Instructions (Signed)
Medication Instructions:  Your physician recommends that you continue on your current medications as directed. Please refer to the Current Medication list given to you today.  Labwork: None ordered  Testing/Procedures: None ordered  Follow-Up: Remote monitoring is used to monitor your Pacemaker of ICD from home. This monitoring reduces the number of office visits required to check your device to one time per year. It allows us to keep an eye on the functioning of your device to ensure it is working properly. You are scheduled for a device check from home on 06/05/2016. You may send your transmission at any time that day. If you have a wireless device, the transmission will be sent automatically. After your physician reviews your transmission, you will receive a postcard with your next transmission date.  Your physician wants you to follow-up in: 1 year with Dr. Taylor.  You will receive a reminder letter in the mail two months in advance. If you don't receive a letter, please call our office to schedule the follow-up appointment.  If you need a refill on your cardiac medications before your next appointment, please call your pharmacy.  Thank you for choosing [facility]!!        

## 2016-03-06 NOTE — Progress Notes (Signed)
HPI [name] returns today for followup. He is a pleasant 60 yo man with complete heart block, s/p PPM insertion approx. 3 months ago. In the interim, he has done well. He denies chest pain or sob. He denies peripheral edema. His bipolar disorder has been well controlled. He is working in [employer] work. Allergies  Allergen Reactions  . Haloperidol Other (See Comments)    Fatigue  . Ibuprofen Other (See Comments)    Cannot take while on lithium  . Sulfa Antibiotics Rash  . Sulfamethoxazole Rash     Current Outpatient Prescriptions  Medication Sig Dispense Refill  . aspirin EC 81 MG tablet Take 81 mg by mouth daily.    [name]. atorvastatin (LIPITOR) 20 MG tablet Take 20 mg by mouth Daily.     . fluticasone (FLONASE) 50 MCG/ACT nasal spray Place 2 sprays into the nose daily as needed (seasonal allergies). For allergies    . lamoTRIgine (LAMICTAL) 25 MG tablet Take 75 mg by mouth daily.  4  . lithium carbonate (LITHOBID) 300 MG CR tablet Take 900 mg by mouth at bedtime.  4  . OVER THE COUNTER MEDICATION Take 3 capsules by mouth daily. CocoaVia- for memory and blood flow    . OVER THE COUNTER MEDICATION Take 2 tablets by mouth daily. For memory - Sensoril 250 mg tablets    . pramipexole (MIRAPEX) 1.5 MG tablet Take 1.5 mg by mouth at bedtime.     [name]. zolpidem (AMBIEN) 10 MG tablet Take 10 mg by mouth at bedtime.      No current facility-administered medications for this visit.     Past Medical History  Diagnosis Date  . Pulmonary embolism (HCC) 10/2009  . Dyslipidemia   . Cystitis, acute   . Diverticulitis   . Lumbago   . BPH (benign prostatic hypertrophy)   . Chest pain   . Dyspnea   . Nephrolithiasis   . Bipolar disorder (HCC)   . Depression   . Anxiety     ROS:   All systems reviewed and negative except as noted in the HPI.   Past Surgical History  Procedure Laterality Date  . Kidney stones      REMOVAL   . Knee arthroscopy      RIGHT KNEE  . Tonsillectomy    .  Ep implantable device N/A 12/04/2015    Procedure: Pacemaker Implant;  Surgeon: Marinus MawGregg W Jaterrius Ricketson, MD;  [facility];  Service: Cardiovascular;  Laterality: N/A;     Family History  Problem Relation Age of Onset  . Heart attack Father [id number]  . Hyperlipidemia Father   . Diabetes Father   . Hypertension Father   . Fibromyalgia Sister   . Stroke Mother   . Arrhythmia Mother     atrial fib.     Social History   Social History  . Marital Status: Married    Spouse Name: N/A  . Number of Children: 2  . Years of Education: N/A   Occupational History  . Occupational hygienistlogistics manager     [employer]   Social History Main Topics  . Smoking status: Former Smoker -- 1.00 packs/day for 18 years    Types: Cigarettes    Quit date: 09/03/1987  . Smokeless tobacco: Not on file  . Alcohol Use: No  . Drug Use: No  . Sexual Activity: Not on file   Other Topics Concern  . Not on file   Social History Narrative  BP 136/82 mmHg  Pulse 74  Ht 5\' 8"  (1.727 m)  Wt 210 lb 3.2 oz (95.346 kg)  BMI 31.97 kg/m2  Physical Exam:  Well appearing NAD HEENT: Unremarkable Neck:  No JVD, no thyromegally Lymphatics:  No adenopathy Back:  No CVA tenderness Lungs:  Clear with no wheezes HEART:  Regular rate rhythm, no murmurs, no rubs, no clicks Abd:  soft, positive bowel sounds, no organomegally, no rebound, no guarding Ext:  2 plus pulses, no edema, no cyanosis, no clubbing Skin:  No rashes no nodules Neuro:  CN II through XII intact, motor grossly intact  EKG - nsr  DEVICE  Normal device function.  See PaceArt for details.   Assess/Plan: 1. CHB - his conduction has improved. He is conducting today and we have reprogrammed his PPM accordingly to allow for intrinsic conduction. 2. PE - he has no symptoms. He will continue his blood thinners. 3. PPM - his medtronic MRI compatible PM is working normally. Will recheck in several months. 4. HTN - his blood pressure is minimally  elevated. He is encouraged to maintain a low sodium diet.  Leonia ReevesGregg Doreena Maulden,M.D.

## 2016-06-05 ENCOUNTER — Ambulatory Visit (INDEPENDENT_AMBULATORY_CARE_PROVIDER_SITE_OTHER): Payer: 59 | Admitting: *Deleted

## 2016-06-05 ENCOUNTER — Telehealth: Payer: Self-pay | Admitting: Cardiology

## 2016-06-05 DIAGNOSIS — I442 Atrioventricular block, complete: Secondary | ICD-10-CM | POA: Diagnosis not present

## 2016-06-05 NOTE — Telephone Encounter (Signed)
Spoke with pt and reminded pt of remote transmission that is due today. Pt verbalized understanding.   

## 2016-06-06 NOTE — Progress Notes (Signed)
Remote pacemaker transmission.   

## 2016-06-07 ENCOUNTER — Encounter: Payer: Self-pay | Admitting: Cardiology

## 2016-06-13 LAB — CUP PACEART REMOTE DEVICE CHECK
Battery Remaining Longevity: 118 mo
Battery Voltage: 3.03 V
Brady Statistic AP VP Percent: 0.23 %
Brady Statistic AP VS Percent: 0.57 %
Brady Statistic AS VP Percent: 0.03 %
Brady Statistic AS VS Percent: 99.16 %
Brady Statistic RA Percent Paced: 0.8 %
Brady Statistic RV Percent Paced: 0.26 %
Date Time Interrogation Session: 20171005020444
Implantable Lead Implant Date: 20170403
Implantable Lead Implant Date: 20170403
Implantable Lead Location: 753859
Implantable Lead Location: 753860
Implantable Lead Model: 5076
Implantable Lead Model: 5076
Lead Channel Impedance Value: 380 Ohm
Lead Channel Impedance Value: 418 Ohm
Lead Channel Impedance Value: 456 Ohm
Lead Channel Impedance Value: 513 Ohm
Lead Channel Pacing Threshold Amplitude: 0.625 V
Lead Channel Pacing Threshold Amplitude: 1.25 V
Lead Channel Pacing Threshold Pulse Width: 0.4 ms
Lead Channel Pacing Threshold Pulse Width: 0.4 ms
Lead Channel Sensing Intrinsic Amplitude: 12.625 mV
Lead Channel Sensing Intrinsic Amplitude: 12.625 mV
Lead Channel Sensing Intrinsic Amplitude: 3.75 mV
Lead Channel Sensing Intrinsic Amplitude: 3.75 mV
Lead Channel Setting Pacing Amplitude: 2 V
Lead Channel Setting Pacing Amplitude: 2.5 V
Lead Channel Setting Pacing Pulse Width: 0.4 ms
Lead Channel Setting Sensing Sensitivity: 2 mV

## 2016-09-04 ENCOUNTER — Ambulatory Visit (INDEPENDENT_AMBULATORY_CARE_PROVIDER_SITE_OTHER): Payer: 59 | Admitting: *Deleted

## 2016-09-04 DIAGNOSIS — I442 Atrioventricular block, complete: Secondary | ICD-10-CM | POA: Diagnosis not present

## 2016-09-05 ENCOUNTER — Telehealth: Payer: Self-pay | Admitting: Cardiology

## 2016-09-05 NOTE — Telephone Encounter (Signed)
Spoke with pt and reminded pt of remote transmission that is due today. Pt verbalized understanding.   

## 2016-09-05 NOTE — Progress Notes (Signed)
Remote pacemaker transmission.   

## 2016-09-06 ENCOUNTER — Encounter: Payer: Self-pay | Admitting: Cardiology

## 2016-09-12 LAB — CUP PACEART REMOTE DEVICE CHECK
Battery Remaining Longevity: 112 mo
Battery Voltage: 3.02 V
Brady Statistic AP VP Percent: 0.47 %
Brady Statistic AP VS Percent: 0.18 %
Brady Statistic AS VP Percent: 15.68 %
Brady Statistic AS VS Percent: 83.67 %
Brady Statistic RA Percent Paced: 0.64 %
Brady Statistic RV Percent Paced: 15.89 %
Date Time Interrogation Session: 20180104201615
Implantable Lead Implant Date: 20170403
Implantable Lead Implant Date: 20170403
Implantable Lead Location: 753859
Implantable Lead Location: 753860
Implantable Lead Model: 5076
Implantable Lead Model: 5076
Implantable Pulse Generator Implant Date: 20170403
Lead Channel Impedance Value: 380 Ohm
Lead Channel Impedance Value: 418 Ohm
Lead Channel Impedance Value: 494 Ohm
Lead Channel Impedance Value: 513 Ohm
Lead Channel Pacing Threshold Amplitude: 0.625 V
Lead Channel Pacing Threshold Amplitude: 1.25 V
Lead Channel Pacing Threshold Pulse Width: 0.4 ms
Lead Channel Pacing Threshold Pulse Width: 0.4 ms
Lead Channel Sensing Intrinsic Amplitude: 13.75 mV
Lead Channel Sensing Intrinsic Amplitude: 13.75 mV
Lead Channel Sensing Intrinsic Amplitude: 4.25 mV
Lead Channel Sensing Intrinsic Amplitude: 4.25 mV
Lead Channel Setting Pacing Amplitude: 2 V
Lead Channel Setting Pacing Amplitude: 2.5 V
Lead Channel Setting Pacing Pulse Width: 0.4 ms
Lead Channel Setting Sensing Sensitivity: 2 mV

## 2016-12-04 ENCOUNTER — Telehealth: Payer: Self-pay | Admitting: Cardiology

## 2016-12-04 ENCOUNTER — Encounter: Payer: 59 | Admitting: *Deleted

## 2016-12-04 NOTE — Telephone Encounter (Signed)
LMOVM reminding pt to send remote transmission.   

## 2016-12-06 ENCOUNTER — Encounter: Payer: Self-pay | Admitting: Cardiology

## 2016-12-06 NOTE — Progress Notes (Signed)
Letter  

## 2016-12-12 ENCOUNTER — Ambulatory Visit (INDEPENDENT_AMBULATORY_CARE_PROVIDER_SITE_OTHER): Payer: 59 | Admitting: *Deleted

## 2016-12-12 DIAGNOSIS — I442 Atrioventricular block, complete: Secondary | ICD-10-CM | POA: Diagnosis not present

## 2016-12-17 NOTE — Progress Notes (Signed)
Remote pacemaker transmission.   

## 2016-12-18 ENCOUNTER — Encounter: Payer: Self-pay | Admitting: Cardiology

## 2016-12-19 LAB — CUP PACEART REMOTE DEVICE CHECK
Battery Remaining Longevity: 110 mo
Battery Voltage: 3.02 V
Brady Statistic AP VP Percent: 0.58 %
Brady Statistic AP VS Percent: 0.45 %
Brady Statistic AS VP Percent: 8.24 %
Brady Statistic AS VS Percent: 90.73 %
Brady Statistic RA Percent Paced: 1.02 %
Brady Statistic RV Percent Paced: 8.7 %
Date Time Interrogation Session: 20180412211812
Implantable Lead Implant Date: 20170403
Implantable Lead Implant Date: 20170403
Implantable Lead Location: 753859
Implantable Lead Location: 753860
Implantable Lead Model: 5076
Implantable Lead Model: 5076
Implantable Pulse Generator Implant Date: 20170403
Lead Channel Impedance Value: 361 Ohm
Lead Channel Impedance Value: 418 Ohm
Lead Channel Impedance Value: 494 Ohm
Lead Channel Impedance Value: 494 Ohm
Lead Channel Pacing Threshold Amplitude: 0.625 V
Lead Channel Pacing Threshold Amplitude: 1.125 V
Lead Channel Pacing Threshold Pulse Width: 0.4 ms
Lead Channel Pacing Threshold Pulse Width: 0.4 ms
Lead Channel Sensing Intrinsic Amplitude: 12.75 mV
Lead Channel Sensing Intrinsic Amplitude: 12.75 mV
Lead Channel Sensing Intrinsic Amplitude: 4 mV
Lead Channel Sensing Intrinsic Amplitude: 4 mV
Lead Channel Setting Pacing Amplitude: 2 V
Lead Channel Setting Pacing Amplitude: 2.5 V
Lead Channel Setting Pacing Pulse Width: 0.4 ms
Lead Channel Setting Sensing Sensitivity: 2 mV

## 2017-02-20 ENCOUNTER — Encounter: Payer: Self-pay | Admitting: Internal Medicine

## 2017-03-10 ENCOUNTER — Encounter: Payer: Self-pay | Admitting: Internal Medicine

## 2017-03-10 ENCOUNTER — Ambulatory Visit (INDEPENDENT_AMBULATORY_CARE_PROVIDER_SITE_OTHER): Payer: 59 | Admitting: Internal Medicine

## 2017-03-10 VITALS — BP 126/78 | HR 85 | Ht 68.0 in | Wt 230.4 lb

## 2017-03-10 DIAGNOSIS — I442 Atrioventricular block, complete: Secondary | ICD-10-CM

## 2017-03-10 DIAGNOSIS — Z950 Presence of cardiac pacemaker: Secondary | ICD-10-CM | POA: Diagnosis not present

## 2017-03-10 LAB — CUP PACEART INCLINIC DEVICE CHECK
Battery Remaining Longevity: 107 mo
Battery Voltage: 3.02 V
Brady Statistic AP VP Percent: 0.57 %
Brady Statistic AP VS Percent: 0.75 %
Brady Statistic AS VP Percent: 5.03 %
Brady Statistic AS VS Percent: 93.66 %
Brady Statistic RA Percent Paced: 1.3 %
Brady Statistic RV Percent Paced: 5.54 %
Date Time Interrogation Session: 20180709131720
Implantable Lead Implant Date: 20170403
Implantable Lead Implant Date: 20170403
Implantable Lead Location: 753859
Implantable Lead Location: 753860
Implantable Lead Model: 5076
Implantable Lead Model: 5076
Implantable Pulse Generator Implant Date: 20170403
Lead Channel Impedance Value: 380 Ohm
Lead Channel Impedance Value: 437 Ohm
Lead Channel Impedance Value: 494 Ohm
Lead Channel Impedance Value: 513 Ohm
Lead Channel Pacing Threshold Amplitude: 0.5 V
Lead Channel Pacing Threshold Amplitude: 1 V
Lead Channel Pacing Threshold Pulse Width: 0.4 ms
Lead Channel Pacing Threshold Pulse Width: 0.4 ms
Lead Channel Sensing Intrinsic Amplitude: 15.25 mV
Lead Channel Sensing Intrinsic Amplitude: 4.125 mV
Lead Channel Setting Pacing Amplitude: 2 V
Lead Channel Setting Pacing Amplitude: 2.5 V
Lead Channel Setting Pacing Pulse Width: 0.4 ms
Lead Channel Setting Sensing Sensitivity: 2 mV

## 2017-03-10 MED ORDER — FUROSEMIDE 40 MG PO TABS: 40.0000 mg | ORAL_TABLET | Freq: Every day | ORAL | 3 refills | Status: DC | PRN

## 2017-03-10 NOTE — Patient Instructions (Addendum)
Medication Instructions:  Your physician has recommended you make the following change in your medication: furosemide (lasix) 40 mg by mouth daily as needed.   Labwork: None ordered.   Testing/Procedures: None ordered.   Follow-Up: Your physician wants you to follow-up in: one year follow up with Dr. Lovena Le.  You will receive a reminder letter in the mail two months in advance. If you don't receive a letter, please call our office to schedule the follow-up appointment.  Remote monitoring is used to monitor your Pacemaker from home. This monitoring reduces the number of office visits required to check your device to one time per year. It allows [place] to keep an eye on the functioning of your device to ensure it is working properly. You are scheduled for a device check from home on 06/09/2017. You may send your transmission at any time that day. If you have a wireless device, the transmission will be sent automatically. After your physician reviews your transmission, you will receive a postcard with your next transmission date.    Any Other Special Instructions Will Be Listed Below (If Applicable).  Furosemide tablets What is this medicine? FUROSEMIDE (fyoor [place] se mide) is a diuretic. It helps you make more urine and to lose salt and excess water from your body. This medicine is used to treat high blood pressure, and edema or swelling from heart, kidney, or liver disease. This medicine may be used for other purposes; ask your health care provider or pharmacist if you have questions. COMMON BRAND NAME(S): Active-Medicated Specimen Kit, Delone, Diuscreen, Lasix, RX Specimen Collection Kit, Specimen Collection Kit, URINX Medicated Specimen Collection What should I tell my health care provider before I take this medicine? They need to know if you have any of these conditions: -abnormal blood electrolytes -diarrhea or vomiting -gout -heart disease -kidney disease, small amounts of urine, or  difficulty passing urine -liver disease -thyroid disease -an unusual or allergic reaction to furosemide, sulfa drugs, other medicines, foods, dyes, or preservatives -pregnant or trying to get pregnant -breast-feeding How should I use this medicine? Take this medicine by mouth with a glass of water. Follow the directions on the prescription label. You may take this medicine with or without food. If it upsets your stomach, take it with food or milk. Do not take your medicine more often than directed. Remember that you will need to pass more urine after taking this medicine. Do not take your medicine at a time of day that will cause you problems. Do not take at bedtime. Talk to your pediatrician regarding the use of this medicine in children. While this drug may be prescribed for selected conditions, precautions do apply. Overdosage: If you think you have taken too much of this medicine contact a poison control center or emergency room at once. NOTE: This medicine is only for you. Do not share this medicine with others. What if I miss a dose? If you miss a dose, take it as soon as you can. If it is almost time for your next dose, take only that dose. Do not take double or extra doses. What may interact with this medicine? -aspirin and aspirin-like medicines -certain antibiotics -chloral hydrate -cisplatin -cyclosporine -digoxin -diuretics -laxatives -lithium -medicines for blood pressure -medicines that relax muscles for surgery -methotrexate -NSAIDs, medicines for pain and inflammation like ibuprofen, naproxen, or indomethacin -phenytoin -steroid medicines like prednisone or cortisone -sucralfate -thyroid hormones This list may not describe all possible interactions. Give your health care provider a list of  all the medicines, herbs, non-prescription drugs, or dietary supplements you use. Also tell them if you smoke, drink alcohol, or use illegal drugs. Some items may interact with your  medicine. What should I watch for while using this medicine? Visit your doctor or health care professional for regular checks on your progress. Check your blood pressure regularly. Ask your doctor or health care professional what your blood pressure should be, and when you should contact him or her. If you are a diabetic, check your blood sugar as directed. You may need to be on a special diet while taking this medicine. Check with your doctor. Also, ask how many glasses of fluid you need to drink a day. You must not get dehydrated. You may get drowsy or dizzy. Do not drive, use machinery, or do anything that needs mental alertness until you know how this drug affects you. Do not stand or sit up quickly, especially if you are an older patient. This reduces the risk of dizzy or fainting spells. Alcohol can make you more drowsy and dizzy. Avoid alcoholic drinks. This medicine can make you more sensitive to the sun. Keep out of the sun. If you cannot avoid being in the sun, wear protective clothing and use sunscreen. Do not use sun lamps or tanning beds/booths. What side effects may I notice from receiving this medicine? Side effects that you should report to your doctor or health care professional as soon as possible: -blood in urine or stools -dry mouth -fever or chills -hearing loss or ringing in the ears -irregular heartbeat -muscle pain or weakness, cramps -skin rash -stomach upset, pain, or nausea -tingling or numbness in the hands or feet -unusually weak or tired -vomiting or diarrhea -yellowing of the eyes or skin Side effects that usually do not require medical attention (report to your doctor or health care professional if they continue or are bothersome): -headache -loss of appetite -unusual bleeding or bruising This list may not describe all possible side effects. Call your doctor for medical advice about side effects. You may report side effects to FDA at 1-800-FDA-1088. Where  should I keep my medicine? Keep out of the reach of children. Store at room temperature between 15 and 30 degrees C (59 and 86 degrees F). Protect from light. Throw away any unused medicine after the expiration date. NOTE: This sheet is a summary. It may not cover all possible information. If you have questions about this medicine, talk to your doctor, pharmacist, or health care provider.  2018 Elsevier/Gold Standard (2014-11-09 13:49:50)    If you need a refill on your cardiac medications before your next appointment, please call your pharmacy.

## 2017-03-10 NOTE — Progress Notes (Signed)
HPI [name] returns today for followup. He is a pleasant 61 yo man with complete heart block, s/p PPM insertion approx. One year ago. In the interim, he has done well but does note some problems with leg swelling. He admits to dietary indiscretion. No chest pain or sob.  Allergies  Allergen Reactions  . Haloperidol Other (See Comments)    Fatigue  . Ibuprofen Other (See Comments)    Cannot take while on lithium  . Sulfa Antibiotics Rash  . Sulfamethoxazole Rash     Current Outpatient Prescriptions  Medication Sig Dispense Refill  . aspirin EC 81 MG tablet Take 81 mg by mouth daily.    [name]. atorvastatin (LIPITOR) 20 MG tablet Take 20 mg by mouth Daily.     . fluticasone (FLONASE) 50 MCG/ACT nasal spray Place 2 sprays into the nose daily as needed (seasonal allergies). For allergies    . lamoTRIgine (LAMICTAL) 25 MG tablet Take 75 mg by mouth daily.  4  . lithium carbonate (LITHOBID) 300 MG CR tablet Take 900 mg by mouth at bedtime.  4  . OVER THE COUNTER MEDICATION Take 3 capsules by mouth daily. CocoaVia- for memory and blood flow    . OVER THE COUNTER MEDICATION Take 2 tablets by mouth daily. For memory - Sensoril 250 mg tablets    . oxybutynin (DITROPAN-XL) 10 MG 24 hr tablet Take 10 mg by mouth daily.    . pramipexole (MIRAPEX) 1.5 MG tablet Take 1.5 mg by mouth at bedtime.     [name]. zolpidem (AMBIEN) 10 MG tablet Take 10 mg by mouth at bedtime.      No current facility-administered medications for this visit.      Past Medical History:  Diagnosis Date  . Anxiety   . Bipolar disorder (HCC)   . BPH (benign prostatic hypertrophy)   . Chest pain   . Cystitis, acute   . Depression   . Diverticulitis   . Dyslipidemia   . Dyspnea   . Lumbago   . Nephrolithiasis   . Pulmonary embolism (HCC) 10/2009    ROS:   All systems reviewed and negative except as noted in the HPI.   Past Surgical History:  Procedure Laterality Date  . EP IMPLANTABLE DEVICE N/A 12/04/2015   Procedure: Pacemaker Implant;  Surgeon: Sean MawGregg W Kamryn Gauthier, MD;  [facility];  Service: Cardiovascular;  Laterality: N/A;  . KIDNEY STONES     REMOVAL   . KNEE ARTHROSCOPY     RIGHT KNEE  . TONSILLECTOMY       Family History  Problem Relation Age of Onset  . Heart attack Father 550  . Hyperlipidemia Father   . Diabetes Father   . Hypertension Father   . Fibromyalgia Sister   . Stroke Mother   . Arrhythmia Mother        atrial fib.     Social History   Social History  . Marital status: Married    Spouse name: N/A  . Number of children: 2  . Years of education: N/A   Occupational History  . logistics manager [employer]   Social History Main Topics  . Smoking status: Former Smoker    Packs/day: 1.00    Years: 18.00    Types: Cigarettes    Quit date: 09/03/1987  . Smokeless tobacco: Never Used  . Alcohol use No  . Drug use: No  . Sexual activity: Not on  file   Other Topics Concern  . Not on file   Social History Narrative  . No narrative on file     BP 126/78   Pulse 85   Ht 5\' 8"  (1.727 m)   Wt 230 lb 6.4 oz (104.5 kg)   BMI 35.03 kg/m   Physical Exam:  Well appearing 61 yo man, NAD HEENT: Unremarkable Neck:  7 cm JVD, no thyromegally Lymphatics:  No adenopathy Back:  No CVA tenderness Lungs:  Clear with no wheezes HEART:  Regular rate rhythm, no murmurs, no rubs, no clicks Abd:  soft, positive bowel sounds, no organomegally, no rebound, no guarding Ext:  2 plus pulses, 1-2+ edema, no cyanosis, no clubbing Skin:  No rashes no nodules Neuro:  CN II through XII intact, motor grossly intact   DEVICE  Normal device function.  See PaceArt for details.   Assess/Plan: 1. CHB - his conduction has improved. He is pacing only about 5%. 2. PE - he has transitioned off of anti-coagulation and has had no recurrent PE 3. PPM - his medtronic MRI compatible PM is working normally. Will recheck in several months. 4. HTN -  his blood pressure is good. He is encouraged to maintain a low sodium diet.  Leonia Reeves.D.

## 2017-04-11 ENCOUNTER — Other Ambulatory Visit: Payer: Self-pay | Admitting: Physician Assistant

## 2017-04-11 DIAGNOSIS — M25562 Pain in left knee: Secondary | ICD-10-CM

## 2017-04-23 ENCOUNTER — Other Ambulatory Visit: Payer: Self-pay | Admitting: Orthopedic Surgery

## 2017-04-23 DIAGNOSIS — M25562 Pain in left knee: Secondary | ICD-10-CM

## 2017-04-28 ENCOUNTER — Ambulatory Visit
Admission: RE | Admit: 2017-04-28 | Discharge: 2017-04-28 | Disposition: A | Discharge status: Home / Self Care | Payer: BLUE CROSS/BLUE SHIELD | Source: Ambulatory Visit | Attending: Orthopedic Surgery | Admitting: Orthopedic Surgery

## 2017-04-28 DIAGNOSIS — M25562 Pain in left knee: Secondary | ICD-10-CM

## 2017-04-28 MED ORDER — IOPAMIDOL (ISOVUE-M 200) INJECTION 41%
40.0000 mL | Freq: Once | INTRAMUSCULAR | Status: AC
  Administered 2017-04-28: 40 mL via INTRA_ARTICULAR

## 2017-06-09 ENCOUNTER — Ambulatory Visit (INDEPENDENT_AMBULATORY_CARE_PROVIDER_SITE_OTHER): Payer: BLUE CROSS/BLUE SHIELD | Admitting: *Deleted

## 2017-06-09 DIAGNOSIS — I442 Atrioventricular block, complete: Secondary | ICD-10-CM | POA: Diagnosis not present

## 2017-06-10 LAB — CUP PACEART REMOTE DEVICE CHECK
Battery Remaining Longevity: 105 mo
Battery Voltage: 3.02 V
Brady Statistic AP VP Percent: 1.22 %
Brady Statistic AP VS Percent: 1.06 %
Brady Statistic AS VP Percent: 0.06 %
Brady Statistic AS VS Percent: 97.67 %
Brady Statistic RA Percent Paced: 2.27 %
Brady Statistic RV Percent Paced: 1.29 %
Date Time Interrogation Session: 20181008120426
Implantable Lead Implant Date: 20170403
Implantable Lead Implant Date: 20170403
Implantable Lead Location: 753859
Implantable Lead Location: 753860
Implantable Lead Model: 5076
Implantable Lead Model: 5076
Implantable Pulse Generator Implant Date: 20170403
Lead Channel Impedance Value: 380 Ohm
Lead Channel Impedance Value: 418 Ohm
Lead Channel Impedance Value: 513 Ohm
Lead Channel Impedance Value: 513 Ohm
Lead Channel Pacing Threshold Amplitude: 0.625 V
Lead Channel Pacing Threshold Amplitude: 1.25 V
Lead Channel Pacing Threshold Pulse Width: 0.4 ms
Lead Channel Pacing Threshold Pulse Width: 0.4 ms
Lead Channel Sensing Intrinsic Amplitude: 13.5 mV
Lead Channel Sensing Intrinsic Amplitude: 13.5 mV
Lead Channel Sensing Intrinsic Amplitude: 4 mV
Lead Channel Sensing Intrinsic Amplitude: 4 mV
Lead Channel Setting Pacing Amplitude: 2 V
Lead Channel Setting Pacing Amplitude: 2.5 V
Lead Channel Setting Pacing Pulse Width: 0.4 ms
Lead Channel Setting Sensing Sensitivity: 2 mV

## 2017-06-10 NOTE — Progress Notes (Signed)
Remote pacemaker transmission.   

## 2017-06-13 ENCOUNTER — Encounter: Payer: Self-pay | Admitting: Cardiology

## 2017-08-14 ENCOUNTER — Ambulatory Visit (HOSPITAL_COMMUNITY)
Admission: EM | Admit: 2017-08-14 | Discharge: 2017-08-14 | Disposition: A | Discharge status: Home / Self Care | Payer: BLUE CROSS/BLUE SHIELD | Attending: Emergency Medicine | Admitting: Emergency Medicine

## 2017-08-14 ENCOUNTER — Encounter (HOSPITAL_COMMUNITY): Payer: Self-pay | Admitting: *Deleted

## 2017-08-14 ENCOUNTER — Other Ambulatory Visit: Payer: Self-pay

## 2017-08-14 DIAGNOSIS — J22 Unspecified acute lower respiratory infection: Secondary | ICD-10-CM

## 2017-08-14 MED ORDER — ALBUTEROL SULFATE HFA 108 (90 BASE) MCG/ACT IN AERS: 1.0000 | INHALATION_SPRAY | Freq: Four times a day (QID) | RESPIRATORY_TRACT | 0 refills | Status: DC | PRN

## 2017-08-14 MED ORDER — PREDNISONE 20 MG PO TABS: 40.0000 mg | ORAL_TABLET | Freq: Every day | ORAL | 0 refills | Status: AC

## 2017-08-14 MED ORDER — AZITHROMYCIN 250 MG PO TABS: ORAL_TABLET | ORAL | 0 refills | Status: DC

## 2017-08-14 NOTE — Discharge Instructions (Signed)
Push fluids to ensure adequate hydration and keep secretions thin.  Tylenol and/or ibuprofen as needed for pain or fevers.  May continue with cough and cold medication at night as needed. Complete course of antibiotics and steroids. Use of inhaler for wheezing or shortness of breath. If symptoms worsen, develop increased pain, shortness of breath or fevers, or do not improve in the next week to return to be seen or to follow up with PCP.

## 2017-08-14 NOTE — ED Triage Notes (Signed)
Per pt he's having nasal congestions, coughing,  sometimes he feels like he's wheezing,

## 2017-08-14 NOTE — ED Provider Notes (Addendum)
[facility]    CSN: [id number] Arrival date & time: 08/14/17  1323     History   Chief Complaint Chief Complaint  Patient presents with  . URI    HPI Sean Winters is a 61 y.o. male.   Sean Winters presents with complaints of persistent cough and congestion which has been ongoing for the past week. States has not worsened but has not improved. Has wheezing at times. Cough has caused muscle soreness to upper abdomen, pain with coughing. without sore throat, ear pain runny nose or fevers. Mild shortness of breath. Without chest pain, leg pain or swelling. Does have a history of PE. Has taken alkaseltzer cough and cold, has been taking it at night which has been helpeful. Cough is productive of phlegm. 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 Denies gi/gu complaints.   ROS per HPI.       Past Medical History:  Diagnosis Date  . Anxiety   . Bipolar disorder (HCC)   . BPH (benign prostatic hypertrophy)   . Chest pain   . Cystitis, acute   . Depression   . Diverticulitis   . Dyslipidemia   . Dyspnea   . Lumbago   . Nephrolithiasis   . Pulmonary embolism (HCC) 10/2009    Patient Active Problem List   Diagnosis Date Noted  . Heart block 12/02/2015  . Complete heart block (HCC) 12/01/2015  . Healthcare maintenance 11/02/2014  . Acute delirium 03/14/2012  . Pulmonary embolism (HCC)   . Dyslipidemia   . Cystitis, acute   . Diverticulitis   . Lumbago   . Nephrolithiasis   . BPH (benign prostatic hypertrophy)   . Chest pain   . Dyspnea   . CHEST PAIN-UNSPECIFIED 09/10/2010  . SNORING 03/12/2010  . RLQ PAIN 11/02/2009  . HYPERLIPIDEMIA 11/01/2009  . PULMONARY EMBOLISM 11/01/2009  . BENIGN PROSTATIC HYPERTROPHY, HX OF 11/01/2009    Past Surgical History:  Procedure Laterality Date  . EP IMPLANTABLE DEVICE N/A 12/04/2015   Procedure: Pacemaker Implant;  Surgeon: Marinus Maw, MD;   Location: [facility] INVASIVE CV LAB;  Service: Cardiovascular;  Laterality: N/A;  . KIDNEY STONES     REMOVAL   . KNEE ARTHROSCOPY     RIGHT KNEE  . TONSILLECTOMY         Home Medications    Prior to Admission medications   Medication Sig Start Date End Date Taking? Authorizing Provider  aspirin EC 81 MG tablet Take 81 mg by mouth daily.   Yes [provider]  atorvastatin (LIPITOR) 20 MG tablet Take 20 mg by mouth Daily.  01/18/12  Yes [provider]  fluticasone (FLONASE) 50 MCG/ACT nasal spray Place 2 sprays into the nose daily as needed (seasonal allergies). For allergies   Yes [provider]  furosemide (LASIX) 40 MG tablet Take 1 tablet (40 mg total) by mouth daily as needed for fluid or edema. 03/10/17 08/14/17 Yes Marinus Maw, MD  lamoTRIgine (LAMICTAL) 25 MG tablet Take 75 mg by mouth daily. 11/03/15  Yes [provider]  lithium carbonate (LITHOBID) 300 MG CR tablet Take 900 mg by mouth at bedtime. 11/15/15  Yes [provider]  OVER THE COUNTER MEDICATION Take 3 capsules by mouth daily. CocoaVia- for memory and blood flow   Yes [provider]  OVER THE COUNTER MEDICATION Take 2 tablets by mouth daily. For memory - Sensoril 250  mg tablets   Yes [provider]  oxybutynin (DITROPAN-XL) 10 MG 24 hr tablet Take 10 mg by mouth daily. 11/07/16  Yes [provider]  pramipexole (MIRAPEX) 1.5 MG tablet Take 1.5 mg by mouth at bedtime.    Yes [provider]  zolpidem (AMBIEN) 10 MG tablet Take 10 mg by mouth at bedtime.    Yes [provider]  albuterol (PROVENTIL HFA;VENTOLIN HFA) 108 (90 Base) MCG/ACT inhaler Inhale 1-2 puffs into the lungs every 6 (six) hours as needed for wheezing or shortness of breath. 08/14/17   [name], [name], NP  azithromycin (ZITHROMAX) 250 MG tablet Take first 2 tablets together, then 1 every day until finished. 08/14/17   [name], [name], NP  predniSONE (DELTASONE) 20 MG  tablet Take 2 tablets (40 mg total) by mouth daily with breakfast for 5 days. 08/14/17 08/19/17  [name], [name], NP    Family History Family History  Problem Relation Age of Onset  . Heart attack Father 950  . Hyperlipidemia Father   . Diabetes Father   . Hypertension Father   . Fibromyalgia Sister   . Stroke Mother   . Arrhythmia Mother        atrial fib.    Social History Social History   Tobacco Use  . Smoking status: Former Smoker    Packs/day: 1.00    Years: 18.00    Pack years: 18.00    Types: Cigarettes    Last attempt to quit: 09/03/1987    Years since quitting: 29.9  . Smokeless tobacco: Never Used  Substance Use Topics  . Alcohol use: No  . Drug use: No     Allergies   Haloperidol; Ibuprofen; Sulfa antibiotics; and Sulfamethoxazole   Review of Systems Review of Systems   Physical Exam Triage Vital Signs ED Triage Vitals  Enc Vitals Group     BP 08/14/17 1344 (!) 146/68     Pulse Rate 08/14/17 1344 99     Resp --      Temp 08/14/17 1344 (!) 97.5 F (36.4 C)     Temp Source 08/14/17 1344 Oral     SpO2 08/14/17 1344 95 %     Weight --      Height --      Head Circumference --      Peak Flow --      Pain Score 08/14/17 1339 3     Pain Loc --      Pain Edu? --      Excl. in GC? --    No data found.  Updated Vital Signs BP (!) 146/68 (BP Location: Left Arm)   Pulse 99   Temp (!) 97.5 F (36.4 C) (Oral)   SpO2 95%   Visual Acuity Right Eye Distance:   Left Eye Distance:   Bilateral Distance:    Right Eye Near:   Left Eye Near:    Bilateral Near:     Physical Exam  Constitutional: He is oriented to person, place, and time. He appears well-developed and well-nourished.  HENT:  Head: Normocephalic and atraumatic.  Right Ear: Tympanic membrane, external ear and ear canal normal.  Left Ear: Tympanic membrane, external ear and ear canal normal.  Nose: Nose normal. Right sinus exhibits no maxillary sinus tenderness and no frontal sinus  tenderness. Left sinus exhibits no maxillary sinus tenderness and no frontal sinus tenderness.  Mouth/Throat: Uvula is midline, oropharynx is clear and moist and mucous membranes are normal.  Eyes:  Conjunctivae are normal. Pupils are equal, round, and reactive to light.  Neck: Normal range of motion.  Cardiovascular: Normal rate and regular rhythm.  Pulmonary/Chest: Effort normal. He has wheezes in the right lower field and the left lower field.  Faint expiratory wheezes to bilateral lower lobes  Lymphadenopathy:    He has no cervical adenopathy.  Neurological: He is alert and oriented to person, place, and time.  Skin: Skin is warm and dry.  Vitals reviewed.    UC Treatments / Results  Labs (all labs ordered are listed, but only abnormal results are displayed) Labs Reviewed - No data to display  EKG  EKG Interpretation None       Radiology No results found.  Procedures Procedures (including critical care time)  Medications Ordered in UC Medications - No data to display   Initial Impression / Assessment and Plan / UC Course  I have reviewed the triage vital signs and the nursing notes.  Pertinent labs & imaging results that were available during my care of the patient were reviewed by me and considered in my medical decision making (see chart for details).     Steroids, zpack and albuterol as needed. Push fluids to ensure adequate hydration and keep secretions thin.  Tylenol and/or ibuprofen as needed for pain or fevers.  Recommended recheck in the next week with PCP. Return precautions provided. Patient verbalized understanding and agreeable to plan.    Final Clinical Impressions(s) / UC Diagnoses   Final diagnoses:  Lower respiratory infection    ED Discharge Orders        Ordered    azithromycin (ZITHROMAX) 250 MG tablet     08/14/17 1401    predniSONE (DELTASONE) 20 MG tablet  Daily with breakfast     08/14/17 1401    albuterol (PROVENTIL HFA;VENTOLIN  HFA) 108 (90 Base) MCG/ACT inhaler  Every 6 hours PRN     08/14/17 1401       Controlled Substance Prescriptions [place] Controlled Substance Registry consulted? Not Applicable   [name], Taelyn Broecker B, NP 08/14/17 1414    Linus MakoBurky, Anner Baity B, NP 08/14/17 1415

## 2017-09-08 ENCOUNTER — Ambulatory Visit (INDEPENDENT_AMBULATORY_CARE_PROVIDER_SITE_OTHER): Payer: BLUE CROSS/BLUE SHIELD | Admitting: *Deleted

## 2017-09-08 ENCOUNTER — Telehealth: Payer: Self-pay | Admitting: Cardiology

## 2017-09-08 DIAGNOSIS — I442 Atrioventricular block, complete: Secondary | ICD-10-CM

## 2017-09-08 NOTE — Telephone Encounter (Signed)
LMOVM reminding pt to send remote transmission.   

## 2017-09-09 ENCOUNTER — Encounter: Payer: Self-pay | Admitting: Cardiology

## 2017-09-09 LAB — CUP PACEART REMOTE DEVICE CHECK
Battery Remaining Longevity: 99 mo
Battery Voltage: 3.02 V
Brady Statistic AP VP Percent: 1.33 %
Brady Statistic AP VS Percent: 0.57 %
Brady Statistic AS VP Percent: 0.08 %
Brady Statistic AS VS Percent: 98.02 %
Brady Statistic RA Percent Paced: 1.9 %
Brady Statistic RV Percent Paced: 1.44 %
Date Time Interrogation Session: 20190108051325
Implantable Lead Implant Date: 20170403
Implantable Lead Implant Date: 20170403
Implantable Lead Location: 753859
Implantable Lead Location: 753860
Implantable Lead Model: 5076
Implantable Lead Model: 5076
Implantable Pulse Generator Implant Date: 20170403
Lead Channel Impedance Value: 399 Ohm
Lead Channel Impedance Value: 437 Ohm
Lead Channel Impedance Value: 513 Ohm
Lead Channel Impedance Value: 513 Ohm
Lead Channel Pacing Threshold Amplitude: 0.625 V
Lead Channel Pacing Threshold Amplitude: 1.125 V
Lead Channel Pacing Threshold Pulse Width: 0.4 ms
Lead Channel Pacing Threshold Pulse Width: 0.4 ms
Lead Channel Sensing Intrinsic Amplitude: 16.125 mV
Lead Channel Sensing Intrinsic Amplitude: 16.125 mV
Lead Channel Sensing Intrinsic Amplitude: 4.625 mV
Lead Channel Sensing Intrinsic Amplitude: 4.625 mV
Lead Channel Setting Pacing Amplitude: 2 V
Lead Channel Setting Pacing Amplitude: 2.5 V
Lead Channel Setting Pacing Pulse Width: 0.4 ms
Lead Channel Setting Sensing Sensitivity: 2 mV

## 2017-09-09 NOTE — Progress Notes (Signed)
Remote pacemaker transmission.   

## 2017-10-20 ENCOUNTER — Other Ambulatory Visit: Payer: Self-pay

## 2017-10-20 MED ORDER — FUROSEMIDE 40 MG PO TABS: 40.0000 mg | ORAL_TABLET | Freq: Every day | ORAL | 5 refills | Status: DC | PRN

## 2017-10-22 ENCOUNTER — Encounter: Payer: Self-pay | Admitting: Physician Assistant

## 2017-10-22 ENCOUNTER — Telehealth: Payer: Self-pay | Admitting: Internal Medicine

## 2017-10-22 NOTE — Telephone Encounter (Signed)
New message    Pt c/o swelling: STAT is pt has developed SOB within 24 hours  1) How much weight have you gained and in what time span? N/A  2) If swelling, where is the swelling located? BOTH LEGS, FEET , ANKLES  3) Are you currently taking a fluid pill? NO  Are you currently SOB? A LITTLE 4) Do you have a log of your daily weights (if so, list)? NO  Have you gained 3 pounds in a day or 5 pounds in a week? N/A 5) Have you traveled recently? NO

## 2017-10-22 NOTE — Progress Notes (Deleted)
Cardiology Office Note    Date:  10/22/2017  ID:  Sean Winters, DOB [birth year], MRN [id number] PCP:  Barbie Banner, MD  Cardiologist:  Dr. Ladona Ridgel   Chief Complaint: swelling  History of Present Illness:  Sean Winters is a 62 y.o. 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 To recap hx, presented in 2017 with dizziness and dyspnea found to be in complete heart block with junctional escape at 43bpm. CTA was negative for PE and troponin was negative. 2D echo had shown EF 55-60%, grade 1 DD, mildly dilated LA. He underwent MDT dual chamber PPM on 12/04/15 and has done relatively well since that time. Last interrogation 09/09/17 normal. Last labs from 2017 include A1c 5.8, LDL 94, normal TSH and free T4, WBC 15k, K 4.1, Cr 1.29->1.13. Had remote stress test in 2012 but unable to populater report.  who is following cholesterol Edema Complete heart block s/p PPM Diastolic dysfunction Dyslipidemia     Past Medical History:  Diagnosis Date  . Anxiety   . Bipolar disorder (HCC)   . BPH (benign prostatic hypertrophy)   . Chest pain   . Complete heart block (HCC)    a. s/p Medtronic PPM 2017  . Cystitis, acute   . Depression   . Diverticulitis   . Dyslipidemia   . Dyspnea   . Lumbago   . Nephrolithiasis   . Pulmonary embolism (HCC) 10/2009    Past Surgical History:  Procedure Laterality Date  . EP IMPLANTABLE DEVICE N/A 12/04/2015   Procedure: Pacemaker Implant;  Surgeon: Marinus Maw, MD;  Location: [facility] CV LAB;  Service: Cardiovascular;  Laterality: N/A;  . KIDNEY STONES     REMOVAL   . KNEE ARTHROSCOPY     RIGHT KNEE  . TONSILLECTOMY      Current Medications: No outpatient medications have been marked as taking for the 10/23/17 encounter (Appointment)  with Laurann Montana, PA-C.   ***   Allergies:   Haloperidol; Ibuprofen; Sulfa antibiotics; and Sulfamethoxazole   Social History   Socioeconomic History  . Marital status: Married    Spouse name: Not on file  . Number of children: 2  . Years of education: Not on file  . Highest education level: Not on file  Social Needs  . Financial resource strain: Not on file  . Food insecurity - worry: Not on file  . Food insecurity - inability: Not on file  . Transportation needs - medical: Not on file  . Transportation needs - non-medical: Not on file  Occupational History  . Occupation: Consulting civil engineer: [employer]    Comment: [employer]  Tobacco Use  . Smoking status: Former Smoker    Packs/day: 1.00    Years: 18.00    Pack years: 18.00    Types: Cigarettes    Last attempt to quit: 09/03/1987    Years since quitting: 30.1  . Smokeless tobacco: Never Used  Substance and Sexual Activity  . Alcohol use: No  . Drug use: No  . Sexual activity: Not on file  Other Topics Concern  . Not on file  Social History Narrative  . Not on file     Family History:  Family History  Problem Relation Age of Onset  . Heart attack  Father 1450  . Hyperlipidemia Father   . Diabetes Father   . Hypertension Father   . Fibromyalgia Sister   . Stroke Mother   . Arrhythmia Mother        atrial fib.   ***  ROS:   Please see the history of present illness. Otherwise, review of systems is positive for ***.  All other systems are reviewed and otherwise negative.    PHYSICAL EXAM:   VS:  There were no vitals taken for this visit.  BMI: There is no height or weight on file to calculate BMI. GEN: Well nourished, well developed, in no acute distress  HEENT: normocephalic, atraumatic Neck: no JVD, carotid bruits, or masses Cardiac: ***RRR; no murmurs, rubs, or gallops, no edema  Respiratory:  clear to auscultation bilaterally, normal work of breathing GI: soft, nontender,  nondistended, + BS MS: no deformity or atrophy  Skin: warm and dry, no rash Neuro:  Alert and Oriented x 3, Strength and sensation are intact, follows commands Psych: euthymic mood, full affect  Wt Readings from Last 3 Encounters:  03/10/17 230 lb 6.4 oz (104.5 kg)  03/06/16 210 lb 3.2 oz (95.3 kg)  12/05/15 206 lb (93.4 kg)      Studies/Labs Reviewed:   EKG:  EKG was ordered today and personally reviewed by me and demonstrates *** EKG was not ordered today.***  Recent Labs: No results found for requested labs within last 8760 hours.   Lipid Panel    Component Value Date/Time   CHOL 145 12/02/2015 0212   TRIG 64 12/02/2015 0212   HDL 38 (L) 12/02/2015 0212   CHOLHDL 3.8 12/02/2015 0212   VLDL 13 12/02/2015 0212   LDLCALC 94 12/02/2015 0212    Additional studies/ records that were reviewed today include: Summarized above.***    ASSESSMENT & PLAN:   1. ***  Disposition: F/u with ***   Medication Adjustments/Labs and Tests Ordered: Current medicines are reviewed at length with the patient today.  Concerns regarding medicines are outlined above. Medication changes, Labs and Tests ordered today are summarized above and listed in the Patient Instructions accessible in Encounters.   Signed, Laurann Montanaayna N Dunn, PA-C  10/22/2017 11:34 AM    [facility] [address], [place], [place]  [address] Phone: [phone]; Fax: [phone]

## 2017-10-22 NOTE — Telephone Encounter (Signed)
Returned Pt call.  Left detailed VM per DPR.  Notified patient that at last visit with Dr. Ladona Ridgelaylor a prescription for furosemide was given to patient to use as needed for edema, sob.  Notified Pt to take one pill as needed for his edema and sob.  Notified to call office if he has further questions.

## 2017-10-23 ENCOUNTER — Ambulatory Visit: Payer: BLUE CROSS/BLUE SHIELD | Admitting: Physician Assistant

## 2017-10-24 ENCOUNTER — Encounter: Payer: Self-pay | Admitting: Cardiology

## 2017-10-24 ENCOUNTER — Ambulatory Visit: Payer: BLUE CROSS/BLUE SHIELD | Admitting: Cardiology

## 2017-10-24 VITALS — BP 146/98 | HR 84 | Ht 68.0 in | Wt 254.0 lb

## 2017-10-24 DIAGNOSIS — I442 Atrioventricular block, complete: Secondary | ICD-10-CM

## 2017-10-24 DIAGNOSIS — R600 Localized edema: Secondary | ICD-10-CM

## 2017-10-24 LAB — BASIC METABOLIC PANEL
BUN/Creatinine Ratio: 17 (ref 10–24)
BUN: 18 mg/dL (ref 8–27)
CO2: 25 mmol/L (ref 20–29)
Calcium: 9.8 mg/dL (ref 8.6–10.2)
Chloride: 105 mmol/L (ref 96–106)
Creatinine, Ser: 1.07 mg/dL (ref 0.76–1.27)
GFR calc Af Amer: 85 mL/min/{1.73_m2} (ref >59)
GFR calc non Af Amer: 73 mL/min/{1.73_m2} (ref >59)
Glucose: 81 mg/dL (ref 65–99)
Potassium: 4.3 mmol/L (ref 3.5–5.2)
Sodium: 144 mmol/L (ref 134–144)

## 2017-10-24 LAB — PRO B NATRIURETIC PEPTIDE: NT-Pro BNP: 46 pg/mL (ref 0–210)

## 2017-10-24 MED ORDER — FUROSEMIDE 40 MG PO TABS: 40.0000 mg | ORAL_TABLET | Freq: Every day | ORAL | 3 refills | Status: DC

## 2017-10-24 NOTE — Patient Instructions (Addendum)
Medication Instructions:  Your physician has recommended you make the following change in your medication:  INCREASE Lasix (Furosemide) to 40 mg twice daily today, tomorrow and Sunday then decrease back to 40 mg once daily   Labwork: TODAY - Basic metabolic panel, BNP   Testing/Procedures: Your physician has requested that you have an echocardiogram. Echocardiography is a painless test that uses sound waves to create images of your heart. It provides your doctor with information about the size and shape of your heart and how well your heart's chambers and valves are working. This procedure takes approximately one hour. There are no restrictions for this procedure.   Follow-Up: Your physician recommends that you schedule a follow-up appointment in: 2 weeks with APP  Your physician recommends that you weigh, daily, at the same time every day, and in the same amount of clothing. Please record your daily weights on the handout provided and bring it to your next appointment.   If you need a refill on your cardiac medications before your next appointment, please call your pharmacy.   Thank you for choosing [facility]! Eligha BridegroomMichelle Jamir Rone, RN [phone]

## 2017-10-24 NOTE — Addendum Note (Signed)
Addended by: Levi AlandSWINYER, MICHELLE M on: 10/24/2017 03:50 PM   Modules accepted: Orders

## 2017-10-24 NOTE — Progress Notes (Signed)
10/24/2017 TALVIN CHRISTIANSON   09-Oct-1955  [id number]  Primary Physician Barbie Banner, MD Primary Cardiologist: Dr. Jens Som Electrophysiologist: Dr. 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 He has been followed by Dr. Jens Som. In 2017, he presented to [facility] with exertional fatigue and dyspnea and was found to be in complete heart block. He denied CP. Echo showed normal LVEF and wall motion. G1 DD noted. Troponin's were negative.  No reversible causes were identified and the patient underwent placement of a permanent pacemaker by Dr. Ladona Ridgel.   His last OV was with Dr. Ladona Ridgel 03/2017. His PPM was functioning normally. He denied CP and dyspnea, but endorsed mild bilateral LEE. He admitted to dietary indiscretion with sodium. Dr. Ladona Ridgel prescribed PO Lasix to take PRN based on swelling.   Most recently on August 14, 2017, he presented to a local urgent care with complaints of mild dyspnea, cough and wheezing.  Based upon workup it was concluded that he had a lower respiratory tract infection.  He was prescribed antibiotics and prednisone x 5 days by the physician.  He did not require hospitalization and was discharged from urgent care.  He now presents to clinic today with complaints of worsening swelling. Has been going on x 3-4 weeks and has been getting worse. He started taking lasix daily 3 days ago with some improvement. He admits to dietary indiscretion with sodium. He eats out a lot, particularly fast food at [employer] and [employer]. He denies CP. He has exertional dyspnea if he tries to walk up stairs but no dyspnea walking on flat surfaces. He sleeps with only 1 pillow. He denies orthopnea and PND. His cough and wheezing has resolved.    2D Echo 12/2015 Study Conclusions  - Left ventricle: The cavity size  was normal. Wall thickness was   normal. Systolic function was normal. The estimated ejection   fraction was in the range of 55% to 60%. Wall motion was normal;   there were no regional wall motion abnormalities. Doppler   parameters are consistent with abnormal left ventricular   relaxation (grade 1 diastolic dysfunction). - Left atrium: The atrium was mildly dilated.  Impressions:  - Normal LV systolic function; grade 1 diastolic dysfunction; mild   LAE; trace TR.   Current Meds  Medication Sig  . albuterol (PROVENTIL HFA;VENTOLIN HFA) 108 (90 Base) MCG/ACT inhaler Inhale 1-2 puffs into the lungs every 6 (six) hours as needed for wheezing or shortness of breath.  [name] aspirin EC 81 MG tablet Take 81 mg by mouth daily.  [name] atorvastatin (LIPITOR) 20 MG tablet Take 20 mg by mouth Daily.   . fluticasone (FLONASE) 50 MCG/ACT nasal spray Place 2 sprays into the nose daily as needed (seasonal allergies). For allergies  . lamoTRIgine (LAMICTAL) 25 MG tablet Take 75 mg by mouth daily.  [name] lithium carbonate (LITHOBID) 300 MG CR tablet Take 900 mg by mouth at bedtime.  [name] OVER THE COUNTER MEDICATION Take 3 capsules by mouth daily. CocoaVia- for memory and blood flow  . OVER THE COUNTER MEDICATION Take 2 tablets by mouth daily. For memory - Sensoril 250 mg tablets  . oxybutynin (DITROPAN-XL) 10 MG 24 hr tablet Take 10 mg by mouth daily.  . pramipexole (MIRAPEX) 1.5 MG tablet Take 1.5 mg by mouth at  bedtime.   [name]. zolpidem (AMBIEN) 10 MG tablet Take 10 mg by mouth at bedtime.   . [DISCONTINUED] azithromycin (ZITHROMAX) 250 MG tablet Take first 2 tablets together, then 1 every day until finished.  . [DISCONTINUED] furosemide (LASIX) 40 MG tablet Take 1 tablet (40 mg total) by mouth daily as needed for fluid or edema.   Allergies  Allergen Reactions  . Haloperidol Other (See Comments)    Fatigue  . Ibuprofen Other (See Comments)    Cannot take while on lithium  . Sulfa Antibiotics Rash  .  Sulfamethoxazole Rash   Past Medical History:  Diagnosis Date  . Anxiety   . Bipolar disorder (HCC)   . BPH (benign prostatic hypertrophy)   . Chest pain   . Complete heart block (HCC)    a. s/p Medtronic PPM 2017  . Cystitis, acute   . Depression   . Diverticulitis   . Dyslipidemia   . Dyspnea   . Lumbago   . Nephrolithiasis   . Pulmonary embolism (HCC) 10/2009   Family History  Problem Relation Age of Onset  . Heart attack Father [id number]  . Hyperlipidemia Father   . Diabetes Father   . Hypertension Father   . Fibromyalgia Sister   . Stroke Mother   . Arrhythmia Mother        atrial fib.   Past Surgical History:  Procedure Laterality Date  . EP IMPLANTABLE DEVICE N/A 12/04/2015   Procedure: Pacemaker Implant;  Surgeon: Marinus MawGregg W Taylor, MD;  [facility];  Service: Cardiovascular;  Laterality: N/A;  . KIDNEY STONES     REMOVAL   . KNEE ARTHROSCOPY     RIGHT KNEE  . TONSILLECTOMY     Social History   Socioeconomic History  . Marital status: Married    Spouse name: Not on file  . Number of children: 2  . Years of education: Not on file  . Highest education level: Not on file  Social Needs  . Financial resource strain: Not on file  . Food insecurity - worry: Not on file  . Food insecurity - inability: Not on file  . Transportation needs - medical: Not on file  . Transportation needs - non-medical: Not on file  Occupational History  . Occupation: Consulting civil engineerlogistics manager    Employer: [employer]    Comment: [employer]  Tobacco Use  . Smoking status: Former Smoker    Packs/day: 1.00    Years: 18.00    Pack years: 18.00    Types: Cigarettes    Last attempt to quit: 09/03/1987    Years since quitting: 30.1  . Smokeless tobacco: Never Used  Substance and Sexual Activity  . Alcohol use: No  . Drug use: No  . Sexual activity: Not on file  Other Topics Concern  . Not on file  Social History Narrative  . Not on file     Review of  Systems: General: negative for chills, fever, night sweats or weight changes.  Cardiovascular: negative for chest pain, dyspnea on exertion, edema, orthopnea, palpitations, paroxysmal nocturnal dyspnea or shortness of breath Dermatological: negative for rash Respiratory: negative for cough or wheezing Urologic: negative for hematuria Abdominal: negative for nausea, vomiting, diarrhea, bright red blood per rectum, melena, or hematemesis Neurologic: negative for visual changes, syncope, or dizziness All other systems reviewed and are otherwise negative except as noted above.   Physical Exam:  Blood pressure (!) 146/98, pulse 84, height 5\' 8"  (1.727 m), weight 254  lb (115.2 kg).  General appearance: alert, cooperative and no distress, obese  Neck: no carotid bruit and no JVD Lungs: clear to auscultation bilaterally Heart: regular rate and rhythm, S1, S2 normal, no murmur, click, rub or gallop Extremities: 2+ bilateral LEE, pitting Pulses: 2+ and symmetric Skin: Skin color, texture, turgor normal. No rashes or lesions Neurologic: Grossly normal  EKG not performed-- personally reviewed   ASSESSMENT AND PLAN:   1. Acute on Chronic Diastolic CHF: pt has known diastolic dysfunction, which was noted on echo in 2017, Grade 1. Has had occasional issues with LEE in the setting of dietary indiscretion with sodium. Dr. Ladona Ridgel previously prescribed PRN lasix. He has had to increase this to daily use over the past 3 days due to increased edema. No resting dyspnea but has exertional dyspnea walking up stairs. No chest pain. He has been eating too much salt. We discussed sodium restriction. We will plan to increase Lasix to 40 mg BID x 3 days, followed by 40 mg daily. Will check BMP and BNP today. Obtain 2D echo to reassess LVF. F/u in 2 weeks. Pt instructed to check weight daily.   2. PPM: implanted 12/2015 by Dr. Ladona Ridgel for CHB. Medtronic device.   3. HTN: mildly elevated, but in the setting of volume  overload. We will increase Lasix as outlined above. We also discussed reducing sodium intake.    Follow-Up w/ me in 2 weeks   Brittainy Delmer Islam, MHS [facility] 10/24/2017 10:15 AM

## 2017-10-28 ENCOUNTER — Telehealth: Payer: Self-pay | Admitting: Cardiology

## 2017-10-28 NOTE — Telephone Encounter (Signed)
Informed patient that we are waiting on labs to be resulted and reviewed.. We will then get back to the patient with them.. Patient verbalized understanding..[name]

## 2017-10-28 NOTE — Telephone Encounter (Signed)
Patient calling back for lab results °

## 2017-10-29 ENCOUNTER — Other Ambulatory Visit: Payer: Self-pay

## 2017-10-29 ENCOUNTER — Ambulatory Visit (HOSPITAL_COMMUNITY): Payer: BLUE CROSS/BLUE SHIELD | Attending: Cardiovascular Disease

## 2017-10-29 DIAGNOSIS — Z950 Presence of cardiac pacemaker: Secondary | ICD-10-CM | POA: Insufficient documentation

## 2017-10-29 DIAGNOSIS — E785 Hyperlipidemia, unspecified: Secondary | ICD-10-CM | POA: Diagnosis not present

## 2017-10-29 DIAGNOSIS — Z87891 Personal history of nicotine dependence: Secondary | ICD-10-CM | POA: Diagnosis not present

## 2017-10-29 DIAGNOSIS — I442 Atrioventricular block, complete: Secondary | ICD-10-CM | POA: Diagnosis not present

## 2017-10-29 DIAGNOSIS — R600 Localized edema: Secondary | ICD-10-CM | POA: Insufficient documentation

## 2017-10-31 ENCOUNTER — Telehealth: Payer: Self-pay | Admitting: Cardiology

## 2017-10-31 NOTE — Telephone Encounter (Signed)
Please review result notes for labs and recent echo for documentation on this encounter.

## 2017-10-31 NOTE — Telephone Encounter (Signed)
Follow Up:    Pt returning Danielle's call from today,concerning his lab and Echo results.

## 2017-11-10 DIAGNOSIS — R609 Edema, unspecified: Secondary | ICD-10-CM | POA: Insufficient documentation

## 2017-11-10 NOTE — Progress Notes (Signed)
Cardiology Office Note    Date:  11/11/2017   ID:  Sean Winters, DOB 1955-11-18, MRN [id number]  PCP:  Christain Sacramento, MD  Cardiologist: Cristopher Peru, MD EPS Dr. Lovena Le Chief Complaint  Patient presents with  . Follow-up    History of Present Illness:  Sean Winters is a 62 y.o. 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  Complete heart block 2017 status post permanent pacemaker at that time with normal LVEF grade 1 DD.  Was given as needed Lasix by Dr. Lovena Le 03/2017 for mild bilateral lower extremity edema.  Admitted to dietary indiscretion.  Patient saw Ellen Henri, PA-C 10/24/17 complaining of worsening edema.  Again he was eating fast food and high sodium diet.  2D echo was ordered LVEF 65-70% with no wall motion abnormality, mild LVH.  She ordered Lasix 40 mg twice daily for 3 days then 40 mg daily.  BNP was 46 bmet normal.  Patient says the edema went down when he was on higher dose lasix came right back when he went back to 40 mg daily.  He has been on lithium for 13 years and thinks this is contributing to his edema.  He has cut back on sodium but still has a ways to go.  He joined a weight loss program through his insurance.    Past Medical History:  Diagnosis Date  . Anxiety   . Bipolar disorder ([place])   . BPH (benign prostatic hypertrophy)   . Chest pain   . Complete heart block ([place])    a. s/p Medtronic PPM 2017  . Cystitis, acute   . Depression   . Diverticulitis   . Dyslipidemia   . Dyspnea   . Lumbago   . Nephrolithiasis   . Pulmonary embolism ([place]) 10/2009    Past Surgical History:  Procedure Laterality Date  . EP IMPLANTABLE DEVICE N/A 12/04/2015   Procedure: Pacemaker Implant;  Surgeon: Evans Lance, MD;  Location: [facility];  Service: Cardiovascular;  Laterality: N/A;  . KIDNEY STONES     REMOVAL   . KNEE ARTHROSCOPY     RIGHT KNEE  .  TONSILLECTOMY      Current Medications: Current Meds  Medication Sig  . albuterol (PROVENTIL HFA;VENTOLIN HFA) 108 (90 Base) MCG/ACT inhaler Inhale 1-2 puffs into the lungs every 6 (six) hours as needed for wheezing or shortness of breath.  [name] aspirin EC 81 MG tablet Take 81 mg by mouth daily.  [name] atorvastatin (LIPITOR) 20 MG tablet Take 20 mg by mouth Daily.   . fluticasone (FLONASE) 50 MCG/ACT nasal spray Place 2 sprays into the nose daily as needed (seasonal allergies). For allergies  . lamoTRIgine (LAMICTAL) 25 MG tablet Take 75 mg by mouth daily.  [name] lithium carbonate (LITHOBID) 300 MG CR tablet Take 900 mg by mouth at bedtime.  [name] OVER THE COUNTER MEDICATION Take 3 capsules by mouth daily. CocoaVia- for memory and blood flow  . OVER THE COUNTER MEDICATION Take 2 tablets by mouth daily. For memory - Sensoril 250 mg tablets  . oxybutynin (DITROPAN-XL) 10 MG 24 hr tablet Take 10 mg by mouth daily.  . pramipexole (MIRAPEX) 1.5 MG tablet Take 1.5 mg by mouth at bedtime.   [name] zolpidem (AMBIEN) 10 MG tablet Take 10 mg by mouth at bedtime.   . [DISCONTINUED] furosemide (LASIX) 40 MG tablet Take 1 tablet (  40 mg total) by mouth daily.     Allergies:   Haloperidol; Ibuprofen; Sulfa antibiotics; and Sulfamethoxazole   Social History   Socioeconomic History  . Marital status: Married    Spouse name: None  . Number of children: 2  . Years of education: None  . Highest education level: None  Social Needs  . Financial resource strain: None  . Food insecurity - worry: None  . Food insecurity - inability: None  . Transportation needs - medical: None  . Transportation needs - non-medical: None  Occupational History  . 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 Smoking status: Former Smoker    Packs/day: 1.00    Years: 18.00    Pack years: 18.00    Types: Cigarettes    Last attempt to quit: 09/03/1987    Years since quitting: 30.2    . Smokeless tobacco: Never Used  Substance and Sexual Activity  . Alcohol use: No  . Drug use: No  . Sexual activity: None  Other Topics Concern  . None  Social History Narrative  . None     Family History:  The patient's family history includes Arrhythmia in his mother; Diabetes in his father; Fibromyalgia in his sister; Heart attack (age of onset: 25) in his father; Hyperlipidemia in his father; Hypertension in his father; Stroke in his mother.   ROS:   Please see the history of present illness.    Review of Systems  Constitution: Positive for weight gain.  HENT: Negative.   Cardiovascular: Positive for dyspnea on exertion and leg swelling.  Respiratory: Negative.   Endocrine: Negative.   Hematologic/Lymphatic: Negative.   Musculoskeletal: Negative.   Gastrointestinal: Negative.   Genitourinary: Negative.   Neurological: Negative.    All other systems reviewed and are negative.   PHYSICAL EXAM:   VS:  BP 122/78   Pulse 99   Ht 5' 8" (1.727 m)   Wt 257 lb 6.4 oz (116.8 kg)   SpO2 95%   BMI 39.14 kg/m   Physical Exam  GEN: Obese, in no acute distress  Neck: no JVD, carotid bruits, or masses Cardiac:RRR; no murmurs, rubs, or gallops  Respiratory:  clear to auscultation bilaterally, normal work of breathing GI: soft, nontender, nondistended, + BS Ext: +3 of 4 edema bilaterally left greater than right neuro:  Alert and Oriented x 3,  Psych: euthymic mood, full affect  Wt Readings from Last 3 Encounters:  11/11/17 257 lb 6.4 oz (116.8 kg)  10/24/17 254 lb (115.2 kg)  03/10/17 230 lb 6.4 oz (104.5 kg)      Studies/Labs Reviewed:   EKG:  EKG is not ordered today.   Recent Labs: 10/24/2017: BUN 18; Creatinine, Ser 1.07; NT-Pro BNP 46; Potassium 4.3; Sodium 144   Lipid Panel    Component Value Date/Time   CHOL 145 12/02/2015 0212   TRIG 64 12/02/2015 0212   HDL 38 (L) 12/02/2015 0212   CHOLHDL 3.8 12/02/2015 0212   VLDL 13 12/02/2015 0212   LDLCALC 94  12/02/2015 0212    Additional studies/ records that were reviewed today include:  2D echo 10/29/17 Study Conclusions   - Left ventricle: The cavity size was normal. Wall thickness was   increased in a pattern of mild LVH. Systolic function was   vigorous. The estimated ejection fraction was in the range of 65%   to 70%. Wall motion was normal; there were no  regional wall   motion abnormalities. - Pulmonary arteries: PA peak pressure: 31 mm Hg (S).    ASSESSMENT:    1. Edema, unspecified type   2. Complete heart block (HCC)   3. Other pulmonary embolism without acute cor pulmonale, unspecified chronicity (HCC)      PLAN:  In order of problems listed above:  Edema secondary to dietary indiscretion, possible lithium, obesity treated with increased Lasix.  Patient initially improved but edema came back once on 40 mg of Lasix.  BNP was normal.  2D echo with normal LVEF 65-70%, no mention of diastolic dysfunction.  He is going to speak with his psychiatrist about reducing his lithium dose in April.  Also recommended weight loss program which he is starting through his insurance.  2 g sodium diet reiterated.  Will increase Lasix to 80 mg daily add potassium 40 mEq daily check be met today.  Follow-up with Dr. Lovena Le in July.  Complete heart block status post permanent pacemaker followed by Dr. Lovena Le  History of pulmonary embolus treated with Coumadin for 4 years then Xarelto for 1 year  Medication Adjustments/Labs and Tests Ordered: Current medicines are reviewed at length with the patient today.  Concerns regarding medicines are outlined above.  Medication changes, Labs and Tests ordered today are listed in the Patient Instructions below. Patient Instructions  Medication Instructions:  Your physician has recommended you make the following change in your medication:  1.  INCREASE the Lasix to 40 mg taking 2 tablets daily 2.  START Potassium 20 meq taking 2 tablet  daily  Labwork: TODAY:  BMET  Testing/Procedures: None ordered  Follow-Up: Your physician recommends that you schedule a follow-up appointment in: July 2019 with Dr. Lovena Le    Any Other Special Instructions Will Be Listed Below (If Applicable).     If you need a refill on your cardiac medications before your next appointment, please call your pharmacy.      Sumner Boast, PA-C  11/11/2017 8:54 AM    [facility] [address], [place], [place]  [address] Phone: [phone]; Fax: [phone]

## 2017-11-11 ENCOUNTER — Encounter: Payer: Self-pay | Admitting: Physician Assistant

## 2017-11-11 ENCOUNTER — Ambulatory Visit: Payer: BLUE CROSS/BLUE SHIELD | Admitting: Physician Assistant

## 2017-11-11 VITALS — BP 122/78 | HR 99 | Ht 68.0 in | Wt 257.4 lb

## 2017-11-11 DIAGNOSIS — I2699 Other pulmonary embolism without acute cor pulmonale: Secondary | ICD-10-CM | POA: Diagnosis not present

## 2017-11-11 DIAGNOSIS — I442 Atrioventricular block, complete: Secondary | ICD-10-CM

## 2017-11-11 DIAGNOSIS — R609 Edema, unspecified: Secondary | ICD-10-CM | POA: Diagnosis not present

## 2017-11-11 LAB — BASIC METABOLIC PANEL
BUN/Creatinine Ratio: 11 (ref 10–24)
BUN: 13 mg/dL (ref 8–27)
CO2: 25 mmol/L (ref 20–29)
Calcium: 9.7 mg/dL (ref 8.6–10.2)
Chloride: 104 mmol/L (ref 96–106)
Creatinine, Ser: 1.15 mg/dL (ref 0.76–1.27)
GFR calc Af Amer: 78 mL/min/{1.73_m2} (ref >59)
GFR calc non Af Amer: 67 mL/min/{1.73_m2} (ref >59)
Glucose: 89 mg/dL (ref 65–99)
Potassium: 4.1 mmol/L (ref 3.5–5.2)
Sodium: 142 mmol/L (ref 134–144)

## 2017-11-11 MED ORDER — POTASSIUM CHLORIDE CRYS ER 20 MEQ PO TBCR: 40.0000 meq | EXTENDED_RELEASE_TABLET | Freq: Every day | ORAL | 1 refills | Status: DC

## 2017-11-11 MED ORDER — FUROSEMIDE 40 MG PO TABS: 80.0000 mg | ORAL_TABLET | Freq: Every day | ORAL | 1 refills | Status: DC

## 2017-11-11 NOTE — Patient Instructions (Addendum)
Medication Instructions:  Your physician has recommended you make the following change in your medication:  1.  INCREASE the Lasix to 40 mg taking 2 tablets daily 2.  START Potassium 20 meq taking 2 tablet daily  Labwork: TODAY:  BMET  Testing/Procedures: None ordered  Follow-Up: Your physician recommends that you schedule a follow-up appointment in: July 2019 with Dr. Ladona Ridgelaylor    Any Other Special Instructions Will Be Listed Below (If Applicable).     If you need a refill on your cardiac medications before your next appointment, please call your pharmacy.

## 2017-11-12 NOTE — Progress Notes (Signed)
Pt has been made aware of normal result and verbalized understanding.  jw 11/12/17

## 2017-12-08 ENCOUNTER — Encounter: Payer: BLUE CROSS/BLUE SHIELD | Admitting: *Deleted

## 2017-12-08 ENCOUNTER — Telehealth: Payer: Self-pay | Admitting: Cardiology

## 2017-12-08 NOTE — Telephone Encounter (Signed)
Spoke with pt and reminded pt of remote transmission that is due today. Pt verbalized understanding.   

## 2017-12-11 ENCOUNTER — Encounter: Payer: Self-pay | Admitting: Cardiology

## 2017-12-12 ENCOUNTER — Other Ambulatory Visit: Payer: Self-pay

## 2017-12-12 ENCOUNTER — Ambulatory Visit (INDEPENDENT_AMBULATORY_CARE_PROVIDER_SITE_OTHER): Payer: BLUE CROSS/BLUE SHIELD | Admitting: *Deleted

## 2017-12-12 DIAGNOSIS — I442 Atrioventricular block, complete: Secondary | ICD-10-CM

## 2017-12-12 MED ORDER — POTASSIUM CHLORIDE CRYS ER 20 MEQ PO TBCR: 40.0000 meq | EXTENDED_RELEASE_TABLET | Freq: Every day | ORAL | 3 refills | Status: DC

## 2017-12-15 NOTE — Progress Notes (Signed)
Remote pacemaker transmission.   

## 2017-12-17 ENCOUNTER — Encounter: Payer: Self-pay | Admitting: Cardiology

## 2017-12-20 ENCOUNTER — Emergency Department (HOSPITAL_BASED_OUTPATIENT_CLINIC_OR_DEPARTMENT_OTHER): Payer: BLUE CROSS/BLUE SHIELD

## 2017-12-20 ENCOUNTER — Emergency Department (HOSPITAL_BASED_OUTPATIENT_CLINIC_OR_DEPARTMENT_OTHER)
Admission: EM | Admit: 2017-12-20 | Discharge: 2017-12-20 | Disposition: A | Discharge status: Home / Self Care | Payer: BLUE CROSS/BLUE SHIELD | Attending: Emergency Medicine | Admitting: Emergency Medicine

## 2017-12-20 ENCOUNTER — Encounter (HOSPITAL_BASED_OUTPATIENT_CLINIC_OR_DEPARTMENT_OTHER): Payer: Self-pay | Admitting: Emergency Medicine

## 2017-12-20 ENCOUNTER — Other Ambulatory Visit: Payer: Self-pay

## 2017-12-20 DIAGNOSIS — Z950 Presence of cardiac pacemaker: Secondary | ICD-10-CM | POA: Diagnosis not present

## 2017-12-20 DIAGNOSIS — R2243 Localized swelling, mass and lump, lower limb, bilateral: Secondary | ICD-10-CM | POA: Diagnosis present

## 2017-12-20 DIAGNOSIS — M79605 Pain in left leg: Secondary | ICD-10-CM | POA: Diagnosis not present

## 2017-12-20 DIAGNOSIS — Z87891 Personal history of nicotine dependence: Secondary | ICD-10-CM | POA: Diagnosis not present

## 2017-12-20 DIAGNOSIS — R609 Edema, unspecified: Secondary | ICD-10-CM

## 2017-12-20 DIAGNOSIS — I5030 Unspecified diastolic (congestive) heart failure: Secondary | ICD-10-CM | POA: Diagnosis not present

## 2017-12-20 DIAGNOSIS — R0602 Shortness of breath: Secondary | ICD-10-CM | POA: Insufficient documentation

## 2017-12-20 DIAGNOSIS — Z79899 Other long term (current) drug therapy: Secondary | ICD-10-CM | POA: Diagnosis not present

## 2017-12-20 DIAGNOSIS — R600 Localized edema: Secondary | ICD-10-CM

## 2017-12-20 DIAGNOSIS — Z7982 Long term (current) use of aspirin: Secondary | ICD-10-CM | POA: Insufficient documentation

## 2017-12-20 LAB — COMPREHENSIVE METABOLIC PANEL
ALT: 16 U/L — ABNORMAL LOW (ref 17–63)
AST: 17 U/L (ref 15–41)
Albumin: 3.7 g/dL (ref 3.5–5.0)
Alkaline Phosphatase: 119 U/L (ref 38–126)
Anion gap: 8 (ref 5–15)
BUN: 15 mg/dL (ref 6–20)
CO2: 26 mmol/L (ref 22–32)
Calcium: 8.9 mg/dL (ref 8.9–10.3)
Chloride: 104 mmol/L (ref 101–111)
Creatinine, Ser: 1.05 mg/dL (ref 0.61–1.24)
GFR calc Af Amer: >60 mL/min (ref >60)
GFR calc non Af Amer: >60 mL/min (ref >60)
Glucose, Bld: 95 mg/dL (ref 65–99)
Potassium: 3.5 mmol/L (ref 3.5–5.1)
Sodium: 138 mmol/L (ref 135–145)
Total Bilirubin: 0.4 mg/dL (ref 0.3–1.2)
Total Protein: 6.8 g/dL (ref 6.5–8.1)

## 2017-12-20 LAB — CBC
HCT: 41.7 % (ref 39.0–52.0)
Hemoglobin: 14 g/dL (ref 13.0–17.0)
MCH: 29.9 pg (ref 26.0–34.0)
MCHC: 33.6 g/dL (ref 30.0–36.0)
MCV: 89.1 fL (ref 78.0–100.0)
Platelets: 251 10*3/uL (ref 150–400)
RBC: 4.68 MIL/uL (ref 4.22–5.81)
RDW: 13.7 % (ref 11.5–15.5)
WBC: 8.9 10*3/uL (ref 4.0–10.5)

## 2017-12-20 LAB — URINALYSIS, ROUTINE W REFLEX MICROSCOPIC
Bilirubin Urine: NEGATIVE
Glucose, UA: NEGATIVE mg/dL
Ketones, ur: NEGATIVE mg/dL
Leukocytes, UA: NEGATIVE
Nitrite: NEGATIVE
Protein, ur: NEGATIVE mg/dL
Specific Gravity, Urine: 1.015 (ref 1.005–1.030)
pH: 6 (ref 5.0–8.0)

## 2017-12-20 LAB — URINALYSIS, MICROSCOPIC (REFLEX): Bacteria, UA: NONE SEEN

## 2017-12-20 LAB — BRAIN NATRIURETIC PEPTIDE: B Natriuretic Peptide: 26.6 pg/mL (ref 0.0–100.0)

## 2017-12-20 NOTE — ED Triage Notes (Addendum)
Patient states that he has had pain and swelling to his left leg, he has been placed on a lasix and potassium for the this . Patient states that he was seen about 3 months ago and his right leg has decreased but his left leg continues to stay swollen and painful  - patient has noted swelling and reddness to his left lower extremity. He also reports that he has intermittent SOB

## 2017-12-20 NOTE — ED Provider Notes (Signed)
[facility] EMERGENCY DEPARTMENT Provider Note   CSN: [id number] Arrival date & time: 12/20/17  1502     History   Chief Complaint Chief Complaint  Patient presents with  . Leg Pain    HPI Sean Winters is a 62 y.o. male.  Patient with history of complete heart block status post pacemaker placement in 2017, grade 1 diastolic heart failure, history of unprovoked PE now off of anticoagulation --presents with complaint of acute on chronic lower extremity swelling, left greater than right with associated left leg pain.  Patient has also noted some mildly increased shortness of breath with exertion.  Patient has seen his cardiologist for this leg swelling over the past several months and he has had increase in dose of home Lasix.  No fevers, nausea or vomiting.  No injuries to the legs.  He states that he has used compression stockings in the past but not currently.  He feels that his weight has remained stable over the past several months.     Past Medical History:  Diagnosis Date  . Anxiety   . Bipolar disorder (HCC)   . BPH (benign prostatic hypertrophy)   . Chest pain   . Complete heart block (HCC)    a. s/p Medtronic PPM 2017  . Cystitis, acute   . Depression   . Diverticulitis   . Dyslipidemia   . Dyspnea   . Lumbago   . Nephrolithiasis   . Pulmonary embolism (HCC) 10/2009    Patient Active Problem List   Diagnosis Date Noted  . Edema 11/10/2017  . Heart block 12/02/2015  . Complete heart block (HCC) 12/01/2015  . Healthcare maintenance 11/02/2014  . Acute delirium 03/14/2012  . Pulmonary embolism (HCC)   . Dyslipidemia   . Cystitis, acute   . Diverticulitis   . Lumbago   . Nephrolithiasis   . BPH (benign prostatic hypertrophy)   . Chest pain   . Dyspnea   . CHEST PAIN-UNSPECIFIED 09/10/2010  . SNORING 03/12/2010  . RLQ PAIN 11/02/2009  . HYPERLIPIDEMIA 11/01/2009  . PULMONARY EMBOLISM 11/01/2009  . BENIGN PROSTATIC HYPERTROPHY, HX OF  11/01/2009    Past Surgical History:  Procedure Laterality Date  . EP IMPLANTABLE DEVICE N/A 12/04/2015   Procedure: Pacemaker Implant;  Surgeon: Marinus Maw, MD;  Location: [facility] INVASIVE CV LAB;  Service: Cardiovascular;  Laterality: N/A;  . KIDNEY STONES     REMOVAL   . KNEE ARTHROSCOPY     RIGHT KNEE  . TONSILLECTOMY          Home Medications    Prior to Admission medications   Medication Sig Start Date End Date Taking? Authorizing Provider  albuterol (PROVENTIL HFA;VENTOLIN HFA) 108 (90 Base) MCG/ACT inhaler Inhale 1-2 puffs into the lungs every 6 (six) hours as needed for wheezing or shortness of breath. 08/14/17   Georgetta Haber, NP  aspirin EC 81 MG tablet Take 81 mg by mouth daily.    [provider]  atorvastatin (LIPITOR) 20 MG tablet Take 20 mg by mouth Daily.  01/18/12   [provider]  fluticasone (FLONASE) 50 MCG/ACT nasal spray Place 2 sprays into the nose daily as needed (seasonal allergies). For allergies    [provider]  furosemide (LASIX) 40 MG tablet Take 2 tablets (80 mg total) by mouth daily. 11/11/17 02/09/18  Dyann Kief, PA-C  lamoTRIgine (LAMICTAL) 25 MG tablet Take 75 mg by mouth daily. 11/03/15   [provider]  lithium  carbonate (LITHOBID) 300 MG CR tablet Take 900 mg by mouth at bedtime. 11/15/15   [provider]  OVER THE COUNTER MEDICATION Take 3 capsules by mouth daily. CocoaVia- for memory and blood flow    [provider]  OVER THE COUNTER MEDICATION Take 2 tablets by mouth daily. For memory - Sensoril 250 mg tablets    [provider]  oxybutynin (DITROPAN-XL) 10 MG 24 hr tablet Take 10 mg by mouth daily. 11/07/16   [provider]  potassium chloride SA (K-DUR,KLOR-CON) 20 MEQ tablet Take 2 tablets (40 mEq total) by mouth daily. 12/12/17 03/12/18  Marinus Maw, MD  pramipexole (MIRAPEX) 1.5 MG tablet Take 1.5 mg by mouth at bedtime.     [provider]  zolpidem  (AMBIEN) 10 MG tablet Take 10 mg by mouth at bedtime.     [provider]    Family History Family History  Problem Relation Age of Onset  . Heart attack Father 79  . Hyperlipidemia Father   . Diabetes Father   . Hypertension Father   . Fibromyalgia Sister   . Stroke Mother   . Arrhythmia Mother        atrial fib.    Social History Social History   Tobacco Use  . Smoking status: Former Smoker    Packs/day: 1.00    Years: 18.00    Pack years: 18.00    Types: Cigarettes    Last attempt to quit: 09/03/1987    Years since quitting: 30.3  . Smokeless tobacco: Never Used  Substance Use Topics  . Alcohol use: No  . Drug use: No     Allergies   Haloperidol; Ibuprofen; Sulfa antibiotics; and Sulfamethoxazole   Review of Systems Review of Systems  Constitutional: Negative for diaphoresis and fever.  Eyes: Negative for redness.  Respiratory: Positive for shortness of breath. Negative for cough.   Cardiovascular: Positive for leg swelling. Negative for chest pain and palpitations.  Gastrointestinal: Negative for abdominal pain, nausea and vomiting.  Genitourinary: Negative for dysuria.  Musculoskeletal: Negative for back pain and neck pain.  Skin: Negative for rash.  Neurological: Negative for syncope and light-headedness.  Psychiatric/Behavioral: The patient is not nervous/anxious.      Physical Exam Updated Vital Signs BP (!) 166/73 (BP Location: Left Arm)   Pulse 79   Temp (!) 97.5 F (36.4 C) (Oral)   Resp 18   Ht 5\' 8"  (1.727 m)   Wt 116.7 kg (257 lb 4.4 oz)   SpO2 100%   BMI 39.12 kg/m   Physical Exam  Constitutional: He appears well-developed and well-nourished.  HENT:  Head: Normocephalic and atraumatic.  Mouth/Throat: Mucous membranes are normal. Mucous membranes are not dry.  Eyes: Conjunctivae are normal.  Neck: Trachea normal and normal range of motion. Neck supple. Normal carotid pulses and no JVD present. No muscular tenderness present.  Carotid bruit is not present. No tracheal deviation present.  Cardiovascular: Normal rate, regular rhythm, S1 normal, S2 normal, normal heart sounds and intact distal pulses. Exam reveals no distant heart sounds and no decreased pulses.  No murmur heard. Pulmonary/Chest: Effort normal and breath sounds normal. No respiratory distress. He has no wheezes. He exhibits no tenderness.  Abdominal: Soft. Normal aorta and bowel sounds are normal. There is no tenderness. There is no rebound and no guarding.  Musculoskeletal: He exhibits edema.  R LE: 2+ pitting edema to knees   L LE: 3+ pitting edema to knees, mild stasis cellulitis  Neurological: He is alert.  Skin: Skin is warm and dry. He is not diaphoretic. No cyanosis. No pallor.  Psychiatric: He has a normal mood and affect.  Nursing note and vitals reviewed.    ED Treatments / Results  Labs (all labs ordered are listed, but only abnormal results are displayed) Labs Reviewed  COMPREHENSIVE METABOLIC PANEL - Abnormal; Notable for the following components:      Result Value   ALT 16 (*)    All other components within normal limits  URINALYSIS, ROUTINE W REFLEX MICROSCOPIC - Abnormal; Notable for the following components:   Hgb urine dipstick TRACE (*)    All other components within normal limits  URINALYSIS, MICROSCOPIC (REFLEX) - Abnormal; Notable for the following components:   Squamous Epithelial / LPF 0-5 (*)    All other components within normal limits  CBC  BRAIN NATRIURETIC PEPTIDE    EKG None  Radiology US Venous Img Lower Unilateral Left  Result Date: 12/20/2017 CLINICAL DATA:  Left calf pain and swelling EXAM: LEFT LOWER EXTREMITY VENOUS DOPPLER ULTRASOUND TECHNIQUE: Gray-scale sonography with graded compression, as well as color Doppler and duplex ultrasound were performed to evaluate the lower extremity deep venous systems from the level of the common femoral vein and including the common femoral, femoral, profunda  femoral, popliteal and calf veins including the posterior tibial, peroneal and gastrocnemius veins when visible. The superficial great saphenous vein was also interrogated. Spectral Doppler was utilized to evaluate flow at rest and with distal augmentation maneuvers in the common femoral, femoral and popliteal veins. COMPARISON:  None. FINDINGS: Contralateral Common Femoral Vein: Respiratory phasicity is normal and symmetric with the symptomatic side. No evidence of thrombus. Normal compressibility. Common Femoral Vein: No evidence of thrombus. Normal compressibility, respiratory phasicity and response to augmentation. Saphenofemoral Junction: No evidence of thrombus. Normal compressibility and flow on color Doppler imaging. Profunda Femoral Vein: No evidence of thrombus. Normal compressibility and flow on color Doppler imaging. Femoral Vein: No evidence of thrombus. Normal compressibility, respiratory phasicity and response to augmentation. Popliteal Vein: No evidence of thrombus. Normal compressibility, respiratory phasicity and response to augmentation. Calf Veins: No evidence of thrombus. Normal compressibility and flow on color Doppler imaging. Superficial Great Saphenous Vein: No evidence of thrombus. Normal compressibility. Venous Reflux:  None. Other Findings:  Calf edema is noted. IMPRESSION: No evidence of deep venous thrombosis. Electronically Signed   By: Alcide Clever M.D.   On: 12/20/2017 16:54    Procedures Procedures (including critical care time)  Medications Ordered in ED Medications - No data to display   Initial Impression / Assessment and Plan / ED Course  I have reviewed the triage vital signs and the nursing notes.  Pertinent labs & imaging results that were available during my care of the patient were reviewed by me and considered in my medical decision making (see chart for details).     Patient seen and examined.  Patient informed of negative ultrasound results.  Given that  he has had some increase in swelling and increasing shortness of breath-will undertake screen for heart failure exacerbation.  Work-up initiated.   Vital signs reviewed and are as follows: BP (!) 166/73 (BP Location: Left Arm)   Pulse 79   Temp (!) 97.5 F (36.4 C) (Oral)   Resp 18   Ht 5\' 8"  (1.727 m)   Wt 116.7 kg (257 lb 4.4 oz)   SpO2 100%   BMI 39.12 kg/m   6:29 PM patient updated on results.  Chest x-ray reviewed by myself.  No signs of liver, kidney problem, heart failure today.  Patient encouraged to follow-up with his primary care doctor and cardiologist in the next week for further evaluation and management.  Discussed importance of elevation of the legs, encourage patient to obtain a pair of fitted compression stockings to use during the day while he is up on his feet.  Encouraged return with worsening symptoms, shortness of breath, chest pain, new symptoms or other concerns.  Patient verbalizes understanding agrees with plan.  Final Clinical Impressions(s) / ED Diagnoses   Final diagnoses:  Bilateral lower extremity edema   Patient with bilateral lower extremity edema ongoing for several months, grade 1 diastolic heart failure, left leg more swollen than right leg with pain.  Workup today shows no acute signs of heart failure exacerbation.  Ultrasound does not demonstrate any DVT in the left leg.  Suspect element of chronic heart failure, venous insufficiency.  Treatments prescribed as above.  Patient will discuss with his primary care and/or cardiologist regarding management with Lasix.  ED Discharge Orders    None       Renne CriglerGeiple, Elliott Quade, Cordelia Poche-C 12/20/17 1832    Cathren LaineSteinl, Kevin, MD 12/20/17 2245

## 2017-12-20 NOTE — ED Notes (Signed)
Pt ambulatory to XR

## 2017-12-20 NOTE — Discharge Instructions (Signed)
Please read and follow all provided instructions.  Your diagnoses today include:  1. Bilateral lower extremity edema   2. Swelling     Tests performed today include:  Ultrasound to look for blood clot -no signs of blood clot  Blood counts, electrolytes, liver tests, heart failure test -normal  Chest x-ray -no heart failure or infection  Vital signs. See below for your results today.   Medications prescribed:   None  Take any prescribed medications only as directed.  Home care instructions:  Follow any educational materials contained in this packet.  Follow-up instructions: Please follow-up with your primary care provider and your cardiologist in the next 7 days for further evaluation of your symptoms.   Return instructions:   Please return to the Emergency Department if you experience worsening symptoms.   Please return if you have any other emergent concerns.  Additional Information:  Your vital signs today were: BP 122/70 (BP Location: Left Arm)    Pulse 66    Temp (!) 97.5 F (36.4 C) (Oral)    Resp 18    Ht 5\' 8"  (1.727 m)    Wt 116.7 kg (257 lb 4.4 oz)    SpO2 98%    BMI 39.12 kg/m  If your blood pressure (BP) was elevated above 135/85 this visit, please have this repeated by your doctor within one month. --------------

## 2017-12-20 NOTE — ED Notes (Signed)
Pt in US at this time 

## 2017-12-31 ENCOUNTER — Encounter: Payer: Self-pay | Admitting: Physician Assistant

## 2017-12-31 LAB — CUP PACEART REMOTE DEVICE CHECK
Battery Remaining Longevity: 97 mo
Battery Voltage: 3.02 V
Brady Statistic AP VP Percent: 1.58 %
Brady Statistic AP VS Percent: 0.74 %
Brady Statistic AS VP Percent: 0.16 %
Brady Statistic AS VS Percent: 97.52 %
Brady Statistic RA Percent Paced: 2.32 %
Brady Statistic RV Percent Paced: 1.77 %
Date Time Interrogation Session: 20190412104502
Implantable Lead Implant Date: 20170403
Implantable Lead Implant Date: 20170403
Implantable Lead Location: 753859
Implantable Lead Location: 753860
Implantable Lead Model: 5076
Implantable Lead Model: 5076
Implantable Pulse Generator Implant Date: 20170403
Lead Channel Impedance Value: 399 Ohm
Lead Channel Impedance Value: 437 Ohm
Lead Channel Impedance Value: 513 Ohm
Lead Channel Impedance Value: 513 Ohm
Lead Channel Pacing Threshold Amplitude: 0.625 V
Lead Channel Pacing Threshold Amplitude: 1.25 V
Lead Channel Pacing Threshold Pulse Width: 0.4 ms
Lead Channel Pacing Threshold Pulse Width: 0.4 ms
Lead Channel Sensing Intrinsic Amplitude: 15.625 mV
Lead Channel Sensing Intrinsic Amplitude: 15.625 mV
Lead Channel Sensing Intrinsic Amplitude: 4.75 mV
Lead Channel Sensing Intrinsic Amplitude: 4.75 mV
Lead Channel Setting Pacing Amplitude: 2 V
Lead Channel Setting Pacing Amplitude: 2.5 V
Lead Channel Setting Pacing Pulse Width: 0.4 ms
Lead Channel Setting Sensing Sensitivity: 2 mV

## 2018-01-20 NOTE — Progress Notes (Signed)
Cardiology Office Note    Date:  01/21/2018   ID:  Sean Winters, DOB 27-Apr-1956, MRN [id number]  PCP:  Barbie Banner, MD  Cardiologist: Lewayne Bunting, MD  Chief Complaint  Patient presents with  . Follow-up    History of Present Illness:  Sean Winters is a 62 y.o. 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  Complete heart block 2017 status post permanent pacemaker at that time with normal LVEF grade 1 DD.  Was given as needed Lasix by Dr. Ladona Ridgel 03/2017 for mild bilateral lower extremity edema.  Admitted to dietary indiscretion.   Worsening edema 10/2017 secondary to high sodium diet.  2D echo EF 65 to 70% no wall motion abnormality, mild LVH.  I saw the patient 11/11/2017 at which time he complained of the edema returning when he went back to the lower dose Lasix 40 mg once daily.  Also thought the lithium he was on for 13 years could be contributing to his edema.  With still getting a fair amount of sodium in his diet.  I increase Lasix to 80 mg daily potassium 40 mEq daily and recommend a weight loss program.  He was going to speak with his psychiatrist about reducing lithium.  Patient in the ER 12/20/2017 with bilateral lower extremity edema.  Ultrasound of the left leg for more swelling than the right did not demonstrate DVT.  BNP was 26 other labs stable creatinine 1.05 hemoglobin 14 potassium 3.5.  Patient comes in today for f/u. Still having edema on left leg. Going to [place] and a cruise to [place] for 5 days. Still not eating properly and getting more salt than he should. Also drinking regular coke for 4 months but now back on diet soda. Edema is about the same. Ordered customized compression stockings from [employer] that have not come in yet.  Complains that weight gain.  He says 5 years ago he weighed 190 pounds.  Starting masters program online in August.     Past  Medical History:  Diagnosis Date  . Anxiety   . Bipolar disorder (HCC)   . BPH (benign prostatic hypertrophy)   . Chest pain   . Complete heart block (HCC)    a. s/p Medtronic PPM 2017  . Cystitis, acute   . Depression   . Diverticulitis   . Dyslipidemia   . Dyspnea   . Lumbago   . Nephrolithiasis   . Pulmonary embolism (HCC) 10/2009    Past Surgical History:  Procedure Laterality Date  . EP IMPLANTABLE DEVICE N/A 12/04/2015   Procedure: Pacemaker Implant;  Surgeon: Marinus Maw, MD;  Location: [facility] INVASIVE CV LAB;  Service: Cardiovascular;  Laterality: N/A;  . KIDNEY STONES     REMOVAL   . KNEE ARTHROSCOPY     RIGHT KNEE  . TONSILLECTOMY      Current Medications: Current Meds  Medication Sig  . albuterol (PROVENTIL HFA;VENTOLIN HFA) 108 (90 Base) MCG/ACT inhaler Inhale 1-2 puffs into the lungs every 6 (six) hours as needed for wheezing or shortness of breath.  [name] aspirin EC 81 MG tablet Take 81 mg by mouth daily.  [name] atorvastatin (LIPITOR) 20 MG tablet Take 20 mg by mouth Daily.   . fluticasone (FLONASE) 50 MCG/ACT nasal spray Place 2 sprays into the nose daily as needed (seasonal allergies). For allergies  . furosemide (LASIX) 40 MG  tablet Take 2 tablets (80 mg total) by mouth daily.  [name] lamoTRIgine (LAMICTAL) 25 MG tablet Take 75 mg by mouth daily.  [name] lithium carbonate (LITHOBID) 300 MG CR tablet Take 900 mg by mouth at bedtime.  [name] OVER THE COUNTER MEDICATION Take 3 capsules by mouth daily. CocoaVia- for memory and blood flow  . OVER THE COUNTER MEDICATION Take 2 tablets by mouth daily. For memory - Sensoril 250 mg tablets  . oxybutynin (DITROPAN-XL) 10 MG 24 hr tablet Take 10 mg by mouth daily.  . potassium chloride SA (K-DUR,KLOR-CON) 20 MEQ tablet Take 2 tablets (40 mEq total) by mouth daily.  . pramipexole (MIRAPEX) 1.5 MG tablet Take 1.5 mg by mouth at bedtime.   [name] zolpidem (AMBIEN) 10 MG tablet Take 10 mg by mouth at bedtime.      Allergies:   Haloperidol;  Ibuprofen; Sulfa antibiotics; and Sulfamethoxazole   Social History   Socioeconomic History  . Marital status: Married    Spouse name: Not on file  . Number of children: 2  . Years of education: Not on file  . Highest education level: Not on file  Occupational History  . Occupation: Consulting civil engineer: [employer]    Comment: [employer]  Social Needs  . Financial resource strain: Not on file  . Food insecurity:    Worry: Not on file    Inability: Not on file  . Transportation needs:    Medical: Not on file    Non-medical: Not on file  Tobacco Use  . Smoking status: Former Smoker    Packs/day: 1.00    Years: 18.00    Pack years: 18.00    Types: Cigarettes    Last attempt to quit: 09/03/1987    Years since quitting: 30.4  . Smokeless tobacco: Never Used  Substance and Sexual Activity  . Alcohol use: No  . Drug use: No  . Sexual activity: Not on file  Lifestyle  . Physical activity:    Days per week: Not on file    Minutes per session: Not on file  . Stress: Not on file  Relationships  . Social connections:    Talks on phone: Not on file    Gets together: Not on file    Attends religious service: Not on file    Active member of club or organization: Not on file    Attends meetings of clubs or organizations: Not on file    Relationship status: Not on file  Other Topics Concern  . Not on file  Social History Narrative  . Not on file     Family History:  The patient's family history includes Arrhythmia in his mother; Diabetes in his father; Fibromyalgia in his sister; Heart attack (age of onset: 82) in his father; Hyperlipidemia in his father; Hypertension in his father; Stroke in his mother.   ROS:   Please see the history of present illness.    Review of Systems  Constitution: Positive for malaise/fatigue.  HENT: Negative.   Cardiovascular: Positive for dyspnea on exertion and leg swelling.  Respiratory: Negative.   Endocrine: Negative.     Hematologic/Lymphatic: Negative.   Musculoskeletal: Negative.   Gastrointestinal: Negative.   Genitourinary: Negative.   Neurological: Negative.    All other systems reviewed and are negative.   PHYSICAL EXAM:   VS:  BP (!) 160/90   Pulse 88   Ht  (1.727 m)   Wt 257 lb (116.6 kg)  BMI 39.08 kg/m   Physical Exam  GEN: Obese, in no acute distress  Neck: no JVD, carotid bruits, or masses Cardiac:RRR; no murmurs, rubs, or gallops  Respiratory:  clear to auscultation bilaterally, normal work of breathing GI: soft, nontender, nondistended, + BS Ext: +3 edema on the right +4 edema on the left, decreased distal pulses  neuro:  Alert and Oriented x 3 Psych: euthymic mood, full affect  Wt Readings from Last 3 Encounters:  01/21/18 257 lb (116.6 kg)  12/20/17 257 lb 4.4 oz (116.7 kg)  11/11/17 257 lb 6.4 oz (116.8 kg)      Studies/Labs Reviewed:   EKG:  EKG is not ordered today.   Recent Labs: 10/24/2017: NT-Pro BNP 46 12/20/2017: ALT 16; B Natriuretic Peptide 26.6; BUN 15; Creatinine, Ser 1.05; Hemoglobin 14.0; Platelets 251; Potassium 3.5; Sodium 138   Lipid Panel    Component Value Date/Time   CHOL 145 12/02/2015 0212   TRIG 64 12/02/2015 0212   HDL 38 (L) 12/02/2015 0212   CHOLHDL 3.8 12/02/2015 0212   VLDL 13 12/02/2015 0212   LDLCALC 94 12/02/2015 0212    Additional studies/ records that were reviewed today include:  2D echo 10/29/17 Study Conclusions   - Left ventricle: The cavity size was normal. Wall thickness was   increased in a pattern of mild LVH. Systolic function was   vigorous. The estimated ejection fraction was in the range of 65%   to 70%. Wall motion was normal; there were no regional wall   motion abnormalities. - Pulmonary arteries: PA peak pressure: 31 mm Hg (S).     ASSESSMENT:    1. Edema, unspecified type   2. Mixed hyperlipidemia   3. History of pulmonary embolism   4. Morbid obesity (HCC)      PLAN:  In order of problems  listed above: Edema secondary to dietary indiscretion and obesity.  2D echo 10/2017 normal LVEF 65 to 70% no mention of diastolic dysfunction 10/2017-still has significant edema despite Lasix.  Recommend weight loss program will refer to weight loss clinic Dr. Rinaldo Ratel.  Patient wants strict program.  2 g sodium diet reiterated..  Compression hose have been ordered.  Complete heart block status post pacemaker followed by Dr. Ladona Ridgel for pacer check in July.  History of pulmonary embolus treated with Coumadin for 4 years then Xarelto x1 year.  Recent lower extremity Doppler on the left negative for DVT.  Morbid obesity weight loss is essential and have referred to weight loss clinic.    Medication Adjustments/Labs and Tests Ordered: Current medicines are reviewed at length with the patient today.  Concerns regarding medicines are outlined above.  Medication changes, Labs and Tests ordered today are listed in the Patient Instructions below. Patient Instructions  Medication Instructions:  Your physician recommends that you continue on your current medications as directed. Please refer to the Current Medication list given to you today.   Labwork: None ordered  Testing/Procedures: None ordered  Follow-Up: Your physician recommends that you schedule a follow-up appointment in: July WITH DR. Ladona Ridgel.  You have been referred to DR. KADOLPH'S OFFICE FOR MEDICAL WEIGHT MANAGEMENT.    Any Other Special Instructions Will Be Listed Below (If Applicable).     If you need a refill on your cardiac medications before your next appointment, please call your pharmacy.      Elson Clan, PA-C  01/21/2018 9:43 AM    [facility] [address] [place], [place], [place]  [address] Phone: [phone]; Fax: [phone]

## 2018-01-21 ENCOUNTER — Encounter

## 2018-01-21 ENCOUNTER — Encounter (INDEPENDENT_AMBULATORY_CARE_PROVIDER_SITE_OTHER): Payer: Self-pay

## 2018-01-21 ENCOUNTER — Ambulatory Visit (INDEPENDENT_AMBULATORY_CARE_PROVIDER_SITE_OTHER): Payer: BLUE CROSS/BLUE SHIELD | Admitting: Physician Assistant

## 2018-01-21 ENCOUNTER — Encounter: Payer: Self-pay | Admitting: Physician Assistant

## 2018-01-21 VITALS — BP 160/90 | HR 88 | Ht 68.0 in | Wt 257.0 lb

## 2018-01-21 DIAGNOSIS — Z86711 Personal history of pulmonary embolism: Secondary | ICD-10-CM | POA: Diagnosis not present

## 2018-01-21 DIAGNOSIS — E782 Mixed hyperlipidemia: Secondary | ICD-10-CM | POA: Diagnosis not present

## 2018-01-21 DIAGNOSIS — R609 Edema, unspecified: Secondary | ICD-10-CM

## 2018-01-21 NOTE — Patient Instructions (Addendum)
Medication Instructions:  Your physician recommends that you continue on your current medications as directed. Please refer to the Current Medication list given to you today.   Labwork: None ordered  Testing/Procedures: None ordered  Follow-Up: Your physician recommends that you schedule a follow-up appointment in: July WITH DR. Ladona Ridgel.  You have been referred to DR. KADOLPH'S OFFICE FOR MEDICAL WEIGHT MANAGEMENT.    Any Other Special Instructions Will Be Listed Below (If Applicable).     If you need a refill on your cardiac medications before your next appointment, please call your pharmacy.

## 2018-03-10 ENCOUNTER — Ambulatory Visit (INDEPENDENT_AMBULATORY_CARE_PROVIDER_SITE_OTHER): Payer: BLUE CROSS/BLUE SHIELD | Admitting: Internal Medicine

## 2018-03-10 ENCOUNTER — Encounter: Payer: Self-pay | Admitting: Internal Medicine

## 2018-03-10 VITALS — BP 136/78 | HR 85 | Ht 68.0 in | Wt 254.0 lb

## 2018-03-10 DIAGNOSIS — Z950 Presence of cardiac pacemaker: Secondary | ICD-10-CM

## 2018-03-10 DIAGNOSIS — Z86711 Personal history of pulmonary embolism: Secondary | ICD-10-CM

## 2018-03-10 DIAGNOSIS — I442 Atrioventricular block, complete: Secondary | ICD-10-CM | POA: Diagnosis not present

## 2018-03-10 LAB — CUP PACEART INCLINIC DEVICE CHECK
Battery Remaining Longevity: 95 mo
Battery Voltage: 3.02 V
Brady Statistic AP VP Percent: 1.44 %
Brady Statistic AP VS Percent: 0.84 %
Brady Statistic AS VP Percent: 0.11 %
Brady Statistic AS VS Percent: 97.61 %
Brady Statistic RA Percent Paced: 2.28 %
Brady Statistic RV Percent Paced: 1.57 %
Date Time Interrogation Session: 20190709135135
Implantable Lead Implant Date: 20170403
Implantable Lead Implant Date: 20170403
Implantable Lead Location: 753859
Implantable Lead Location: 753860
Implantable Lead Model: 5076
Implantable Lead Model: 5076
Implantable Pulse Generator Implant Date: 20170403
Lead Channel Impedance Value: 380 Ohm
Lead Channel Impedance Value: 418 Ohm
Lead Channel Impedance Value: 475 Ohm
Lead Channel Impedance Value: 513 Ohm
Lead Channel Pacing Threshold Amplitude: 0.5 V
Lead Channel Pacing Threshold Amplitude: 1.25 V
Lead Channel Pacing Threshold Pulse Width: 0.4 ms
Lead Channel Pacing Threshold Pulse Width: 0.4 ms
Lead Channel Sensing Intrinsic Amplitude: 15 mV
Lead Channel Sensing Intrinsic Amplitude: 4.25 mV
Lead Channel Setting Pacing Amplitude: 2 V
Lead Channel Setting Pacing Amplitude: 2.5 V
Lead Channel Setting Pacing Pulse Width: 0.4 ms
Lead Channel Setting Sensing Sensitivity: 2 mV

## 2018-03-10 MED ORDER — POTASSIUM CHLORIDE CRYS ER 20 MEQ PO TBCR: 40.0000 meq | EXTENDED_RELEASE_TABLET | Freq: Every day | ORAL | 3 refills | Status: DC

## 2018-03-10 MED ORDER — FUROSEMIDE 40 MG PO TABS: 80.0000 mg | ORAL_TABLET | Freq: Every day | ORAL | 3 refills | Status: DC

## 2018-03-10 NOTE — Patient Instructions (Signed)

## 2018-03-10 NOTE — Progress Notes (Signed)
HPI [name] returns today for followup. He is a pleasant 62 yo man with complete heart block, s/p PPM insertion approx. 2 years ago. In the interim, he has done well but does note some problems with leg swelling. He admits to dietary indiscretion. No chest pain or sob. He is going back to school to start a social work program. He takes his lasix in the late afternoon/early evening.  Allergies  Allergen Reactions  . Haloperidol Other (See Comments)    Fatigue  . Ibuprofen Other (See Comments)    Cannot take while on lithium  . Sulfa Antibiotics Rash  . Sulfamethoxazole Rash     Current Outpatient Medications  Medication Sig Dispense Refill  . albuterol (PROVENTIL HFA;VENTOLIN HFA) 108 (90 Base) MCG/ACT inhaler Inhale 1-2 puffs into the lungs every 6 (six) hours as needed for wheezing or shortness of breath. 1 Inhaler 0  . aspirin EC 81 MG tablet Take 81 mg by mouth daily.    [name]. atorvastatin (LIPITOR) 20 MG tablet Take 20 mg by mouth Daily.     . fluticasone (FLONASE) 50 MCG/ACT nasal spray Place 2 sprays into the nose daily as needed (seasonal allergies). For allergies    . lamoTRIgine (LAMICTAL) 25 MG tablet Take 75 mg by mouth daily.  4  . lithium carbonate (LITHOBID) 300 MG CR tablet Take 900 mg by mouth at bedtime.  4  . OVER THE COUNTER MEDICATION Take 3 capsules by mouth daily. CocoaVia- for memory and blood flow    . OVER THE COUNTER MEDICATION Take 2 tablets by mouth daily. For memory - Sensoril 250 mg tablets    . oxybutynin (DITROPAN-XL) 10 MG 24 hr tablet Take 10 mg by mouth daily.    . potassium chloride SA (K-DUR,KLOR-CON) 20 MEQ tablet Take 2 tablets (40 mEq total) by mouth daily. 180 tablet 3  . pramipexole (MIRAPEX) 1.5 MG tablet Take 1.5 mg by mouth at bedtime.     [name]. zolpidem (AMBIEN) 10 MG tablet Take 10 mg by mouth at bedtime.     . furosemide (LASIX) 40 MG tablet Take 2 tablets (80 mg total) by mouth daily. 180 tablet 1   No current facility-administered  medications for this visit.      Past Medical History:  Diagnosis Date  . Anxiety   . Bipolar disorder (HCC)   . BPH (benign prostatic hypertrophy)   . Chest pain   . Complete heart block (HCC)    a. s/p Medtronic PPM 2017  . Cystitis, acute   . Depression   . Diverticulitis   . Dyslipidemia   . Dyspnea   . Lumbago   . Nephrolithiasis   . Pulmonary embolism (HCC) 10/2009    ROS:   All systems reviewed and negative except as noted in the HPI.   Past Surgical History:  Procedure Laterality Date  . EP IMPLANTABLE DEVICE N/A 12/04/2015   Procedure: Pacemaker Implant;  Surgeon: Marinus MawGregg W Bartt Gonzaga, MD;  [facility];  Service: Cardiovascular;  Laterality: N/A;  . KIDNEY STONES     REMOVAL   . KNEE ARTHROSCOPY     RIGHT KNEE  . TONSILLECTOMY       Family History  Problem Relation Age of Onset  . Heart attack Father 6850  . Hyperlipidemia Father   . Diabetes Father   . Hypertension Father   . Fibromyalgia Sister   . Stroke Mother   . Arrhythmia Mother  atrial fib.     Social History   Socioeconomic History  . Marital status: Married    Spouse name: Not on file  . Number of children: 2  . Years of education: Not on file  . Highest education level: Not on file  Occupational History  . Occupation: Consulting civil engineer: [employer]    Comment: [employer]  Social Needs  . Financial resource strain: Not on file  . Food insecurity:    Worry: Not on file    Inability: Not on file  . Transportation needs:    Medical: Not on file    Non-medical: Not on file  Tobacco Use  . Smoking status: Former Smoker    Packs/day: 1.00    Years: 18.00    Pack years: 18.00    Types: Cigarettes    Last attempt to quit: 09/03/1987    Years since quitting: 30.5  . Smokeless tobacco: Never Used  Substance and Sexual Activity  . Alcohol use: No  . Drug use: No  . Sexual activity: Not on file  Lifestyle  . Physical activity:    Days per week:  Not on file    Minutes per session: Not on file  . Stress: Not on file  Relationships  . Social connections:    Talks on phone: Not on file    Gets together: Not on file    Attends religious service: Not on file    Active member of club or organization: Not on file    Attends meetings of clubs or organizations: Not on file    Relationship status: Not on file  . Intimate partner violence:    Fear of current or ex partner: Not on file    Emotionally abused: Not on file    Physically abused: Not on file    Forced sexual activity: Not on file  Other Topics Concern  . Not on file  Social History Narrative  . Not on file     BP 136/78   Pulse 85   Ht 5\' 8"  (1.727 m)   Wt 254 lb (115.2 kg)   SpO2 93%   BMI 38.62 kg/m   Physical Exam:  Well appearing 62 yo man, NAD HEENT: Unremarkable Neck:  6 cm JVD, no thyromegally Lymphatics:  No adenopathy Back:  No CVA tenderness Lungs:  Clear with no wheezes HEART:  Regular rate rhythm, no murmurs, no rubs, no clicks Abd:  soft, positive bowel sounds, no organomegally, no rebound, no guarding Ext:  2 plus pulses, no edema, no cyanosis, no clubbing Skin:  No rashes no nodules Neuro:  CN II through XII intact, motor grossly intact  EKG - NSR with first degree AV block  DEVICE  Normal device function.  See PaceArt for details.   Assess/Plan: 1. 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 Will recheck in several months 2. HTN - his blood pressure is minimally elevated. We will follow. I encouraged him to start walking 2 miles a day and lose weight 3. Obesity - he is pending dietary support 4. Bipolar - he is stable though I suspect that the lithium is contributing to his edema.  Leonia Reeves.D.

## 2018-03-16 ENCOUNTER — Ambulatory Visit (INDEPENDENT_AMBULATORY_CARE_PROVIDER_SITE_OTHER): Payer: BLUE CROSS/BLUE SHIELD | Admitting: *Deleted

## 2018-03-16 ENCOUNTER — Telehealth: Payer: Self-pay

## 2018-03-16 DIAGNOSIS — I442 Atrioventricular block, complete: Secondary | ICD-10-CM | POA: Diagnosis not present

## 2018-03-16 NOTE — Telephone Encounter (Signed)
Spoke with pt and reminded pt of remote transmission that is due today. Pt verbalized understanding.   

## 2018-03-17 LAB — CUP PACEART REMOTE DEVICE CHECK
Battery Remaining Longevity: 95 mo
Battery Voltage: 3.02 V
Brady Statistic AP VP Percent: 1.87 %
Brady Statistic AP VS Percent: 0.81 %
Brady Statistic AS VP Percent: 0.34 %
Brady Statistic AS VS Percent: 96.98 %
Brady Statistic RA Percent Paced: 2.68 %
Brady Statistic RV Percent Paced: 2.24 %
Date Time Interrogation Session: 20190715230206
Implantable Lead Implant Date: 20170403
Implantable Lead Implant Date: 20170403
Implantable Lead Location: 753859
Implantable Lead Location: 753860
Implantable Lead Model: 5076
Implantable Lead Model: 5076
Implantable Pulse Generator Implant Date: 20170403
Lead Channel Impedance Value: 361 Ohm
Lead Channel Impedance Value: 399 Ohm
Lead Channel Impedance Value: 456 Ohm
Lead Channel Impedance Value: 494 Ohm
Lead Channel Pacing Threshold Amplitude: 0.625 V
Lead Channel Pacing Threshold Amplitude: 1.25 V
Lead Channel Pacing Threshold Pulse Width: 0.4 ms
Lead Channel Pacing Threshold Pulse Width: 0.4 ms
Lead Channel Sensing Intrinsic Amplitude: 12.375 mV
Lead Channel Sensing Intrinsic Amplitude: 12.375 mV
Lead Channel Sensing Intrinsic Amplitude: 2.875 mV
Lead Channel Sensing Intrinsic Amplitude: 2.875 mV
Lead Channel Setting Pacing Amplitude: 2 V
Lead Channel Setting Pacing Amplitude: 2.5 V
Lead Channel Setting Pacing Pulse Width: 0.4 ms
Lead Channel Setting Sensing Sensitivity: 2 mV

## 2018-03-17 NOTE — Progress Notes (Signed)
Remote pacemaker transmission.   

## 2018-03-18 ENCOUNTER — Encounter: Payer: Self-pay | Admitting: Cardiology

## 2018-04-02 ENCOUNTER — Encounter (INDEPENDENT_AMBULATORY_CARE_PROVIDER_SITE_OTHER): Payer: Self-pay

## 2018-04-16 ENCOUNTER — Ambulatory Visit (INDEPENDENT_AMBULATORY_CARE_PROVIDER_SITE_OTHER): Payer: BLUE CROSS/BLUE SHIELD | Admitting: Family Medicine

## 2018-04-16 ENCOUNTER — Encounter (INDEPENDENT_AMBULATORY_CARE_PROVIDER_SITE_OTHER): Payer: Self-pay | Admitting: Family Medicine

## 2018-04-16 ENCOUNTER — Ambulatory Visit (INDEPENDENT_AMBULATORY_CARE_PROVIDER_SITE_OTHER): Payer: Self-pay | Admitting: Family Medicine

## 2018-04-16 VITALS — BP 110/73 | HR 74 | Temp 97.6°F | Ht 66.0 in | Wt 250.0 lb

## 2018-04-16 DIAGNOSIS — Z1331 Encounter for screening for depression: Secondary | ICD-10-CM

## 2018-04-16 DIAGNOSIS — R5383 Other fatigue: Secondary | ICD-10-CM

## 2018-04-16 DIAGNOSIS — Z0289 Encounter for other administrative examinations: Secondary | ICD-10-CM

## 2018-04-16 DIAGNOSIS — E782 Mixed hyperlipidemia: Secondary | ICD-10-CM | POA: Insufficient documentation

## 2018-04-16 DIAGNOSIS — Z6841 Body Mass Index (BMI) 40.0 and over, adult: Secondary | ICD-10-CM

## 2018-04-16 DIAGNOSIS — E7849 Other hyperlipidemia: Secondary | ICD-10-CM | POA: Diagnosis not present

## 2018-04-16 DIAGNOSIS — Z9189 Other specified personal risk factors, not elsewhere classified: Secondary | ICD-10-CM

## 2018-04-16 DIAGNOSIS — R0602 Shortness of breath: Secondary | ICD-10-CM | POA: Diagnosis not present

## 2018-04-16 NOTE — Progress Notes (Signed)
Office: [phone]  /  Fax: [phone]   Dear Dr. Ladona Winters,   Thank you for referring Sean Winters to our clinic. The following note includes my evaluation and treatment recommendations.  HPI:   Chief Complaint: OBESITY    Sean Winters has been referred by Sean Winters. Sean Ridgel, MD for consultation regarding his obesity and obesity related comorbidities.    Sean Winters (MR# [id number]) is a 62 y.o. male who presents on 04/16/2018 for obesity evaluation and treatment. Current BMI is Body mass index is 40.35 kg/m.[name] Sean Winters has been struggling with his weight for many years and has been unsuccessful in either losing weight, maintaining weight loss, or reaching his healthy weight goal.     Sean Winters attended our information session and states he is currently in the action stage of change and ready to dedicate time achieving and maintaining a healthier weight. Sean Winters is interested in becoming our patient and working on intensive lifestyle modifications including (but not limited to) diet, exercise and weight loss.    Sean Winters states his family eats meals together he thinks his family will eat healthier with  him his desired weight loss is 70 lbs he started gaining weight 3 years ago his heaviest weight ever was 250 lbs he has significant food cravings issues  he snacks frequently in the evenings he is frequently drinking liquids with calories he frequently makes poor food choices he struggles with emotional eating    Fatigue Sean Winters feels his energy is lower than it should be. This has worsened with weight gain and has not worsened recently. Sean Winters admits to daytime somnolence and  admits to waking up still tired. Patient is at risk for obstructive sleep apnea. Patent has a history of symptoms of daytime fatigue. Patient generally gets 6 hours of sleep per night, and states they generally have generally restful sleep. Snoring is present. Apneic episodes are not present. Epworth  Sleepiness Score is 10.  Dyspnea on exertion Sean Winters notes increasing shortness of breath with exercising and seems to be worsening over time with weight gain. He notes getting out of breath sooner with activity than he used to. This has not gotten worse recently. Sean Winters denies orthopnea.  Hyperlipidemia Eh has hyperlipidemia and has been trying to improve his cholesterol levels with intensive lifestyle modification including a low saturated fat diet, exercise and weight loss. He is on Lipitor and denies any chest pain, claudication or myalgias.  Depression Screen Sean Winters's Food and Mood (modified PHQ-9) score was  Depression screen PHQ 2/9 04/16/2018  Decreased Interest 3  Down, Depressed, Hopeless 1  PHQ - 2 Score 4  Altered sleeping 2  Tired, decreased energy 3  Change in appetite 1  Feeling bad or failure about yourself  0  Trouble concentrating 0  Moving slowly or fidgety/restless 2  Suicidal thoughts 0  PHQ-9 Score 12  Difficult doing work/chores Somewhat difficult   At risk for diabetes Sean Winters is at higher than average risk for developing diabetes due to his obesity. He currently denies polyuria or polydipsia.  ALLERGIES: Allergies  Allergen Reactions  . Haloperidol Other (See Comments)    Fatigue  . Ibuprofen Other (See Comments)    Cannot take while on lithium  . Sulfa Antibiotics Rash  . Sulfamethoxazole Rash    MEDICATIONS: Current Outpatient Medications on File Prior to Visit  Medication Sig Dispense Refill  . ASHWAGANDHA PO Take 250 mg by mouth once.    [name] aspirin EC 81 MG tablet Take 81 mg  by mouth daily.    [name]. atorvastatin (LIPITOR) 20 MG tablet Take 20 mg by mouth Daily.     . furosemide (LASIX) 40 MG tablet Take 2 tablets (80 mg total) by mouth daily. 180 tablet 3  . lamoTRIgine (LAMICTAL) 25 MG tablet Take 75 mg by mouth daily.  4  . lithium carbonate (LITHOBID) 300 MG CR tablet Take 900 mg by mouth at bedtime.  4  . OVER THE COUNTER MEDICATION  Take 3 capsules by mouth daily. CocoaVia- for memory and blood flow    . OVER THE COUNTER MEDICATION Take 2 tablets by mouth daily. For memory - Sensoril 250 mg tablets    . oxybutynin (DITROPAN-XL) 10 MG 24 hr tablet Take 10 mg by mouth daily.    . potassium chloride SA (K-DUR,KLOR-CON) 20 MEQ tablet Take 2 tablets (40 mEq total) by mouth daily. 180 tablet 3  . pramipexole (MIRAPEX) 1.5 MG tablet Take 1.5 mg by mouth at bedtime.     [name]. zolpidem (AMBIEN) 10 MG tablet Take 10 mg by mouth at bedtime.      No current facility-administered medications on file prior to visit.     PAST MEDICAL HISTORY: Past Medical History:  Diagnosis Date  . Anxiety   . Bipolar disorder (HCC)   . BPH (benign prostatic hypertrophy)   . Chest pain   . Complete heart block (HCC)    a. s/p Medtronic PPM 2017  . Cystitis, acute   . Depression   . Diverticulitis   . Dyslipidemia   . Dyspnea   . Edema   . Lumbago   . Nephrolithiasis   . Pulmonary embolism (HCC) 10/2009    PAST SURGICAL HISTORY: Past Surgical History:  Procedure Laterality Date  . EP IMPLANTABLE DEVICE N/A 12/04/2015   Procedure: Pacemaker Implant;  Surgeon: Marinus MawGregg W Taylor, MD;  [facility];  Service: Cardiovascular;  Laterality: N/A;  . FINGER FRACTURE SURGERY  2004  . KIDNEY STONES     REMOVAL   . KNEE ARTHROSCOPY     RIGHT KNEE  . TONSILLECTOMY      SOCIAL HISTORY: Social History   Tobacco Use  . Smoking status: Former Smoker    Packs/day: 1.00    Years: 18.00    Pack years: 18.00    Types: Cigarettes    Last attempt to quit: 09/03/1987    Years since quitting: 30.6  . Smokeless tobacco: Never Used  Substance Use Topics  . Alcohol use: No  . Drug use: No    FAMILY HISTORY: Family History  Problem Relation Age of Onset  . Heart attack Father 5450  . Hyperlipidemia Father   . Diabetes Father   . Hypertension Father   . Heart disease Father   . Fibromyalgia Sister   . Stroke Mother   . Arrhythmia  Mother        atrial fib.  . Depression Mother   . Anxiety disorder Mother   . Bipolar disorder Mother     ROS: Review of Systems  Constitutional: Positive for malaise/fatigue. Negative for weight loss.  Eyes:       + Wear glasses or contacts  Respiratory: Positive for shortness of breath.   Cardiovascular: Negative for chest pain, orthopnea and claudication.       + Calf/leg pain with walking  Genitourinary: Negative for frequency.  Musculoskeletal: Negative for myalgias.  Endo/Heme/Allergies: Positive for polydipsia.  Psychiatric/Behavioral: Positive for depression (mild). Negative for suicidal ideas.  PHYSICAL EXAM: Blood pressure 110/73, pulse 74, temperature 97.6 F (36.4 C), temperature source Oral, height 5\' 6"  (1.676 m), weight 250 lb (113.4 kg), SpO2 95 %. Body mass index is 40.35 kg/m. Physical Exam  Constitutional: He is oriented to person, place, and time. He appears well-developed and well-nourished.  HENT:  Head: Normocephalic and atraumatic.  Nose: Nose normal.  Eyes: EOM are normal. No scleral icterus.  Neck: Normal range of motion. Neck supple. No thyromegaly present.  Cardiovascular: Normal rate and regular rhythm.  Pulmonary/Chest: Effort normal. No respiratory distress.  Slight crackle in LT upper lobe  Abdominal: Soft. There is no tenderness.  + Obesity  Musculoskeletal:  Range of Motion normal in all 4 extremities 2+ RT lower extremity edema, 3+ LT lower extremity edema noted in bilateral lower extremities  Neurological: He is alert and oriented to person, place, and time. Coordination normal.  Skin: Skin is warm and dry.  Psychiatric: He has a normal mood and affect. His behavior is normal.  Vitals reviewed.   RECENT LABS AND TESTS: BMET    Component Value Date/Time   NA 138 12/20/2017 1728   NA 142 11/11/2017 0859   K 3.5 12/20/2017 1728   CL 104 12/20/2017 1728   CO2 26 12/20/2017 1728   GLUCOSE 95 12/20/2017 1728   BUN 15 12/20/2017  1728   BUN 13 11/11/2017 0859   CREATININE 1.05 12/20/2017 1728   CALCIUM 8.9 12/20/2017 1728   GFRNONAA >60 12/20/2017 1728   GFRAA >60 12/20/2017 1728   Lab Results  Component Value Date   HGBA1C 5.8 (H) 12/02/2015   No results found for: INSULIN CBC    Component Value Date/Time   WBC 8.9 12/20/2017 1728   RBC 4.68 12/20/2017 1728   HGB 14.0 12/20/2017 1728   HCT 41.7 12/20/2017 1728   PLT 251 12/20/2017 1728   MCV 89.1 12/20/2017 1728   MCH 29.9 12/20/2017 1728   MCHC 33.6 12/20/2017 1728   RDW 13.7 12/20/2017 1728   LYMPHSABS 4.5 (H) 12/01/2015 1915   MONOABS 0.9 12/01/2015 1915   EOSABS 0.2 12/01/2015 1915   BASOSABS 0.0 12/01/2015 1915   Iron/TIBC/Ferritin/ %Sat No results found for: IRON, TIBC, FERRITIN, IRONPCTSAT Lipid Panel     Component Value Date/Time   CHOL 145 12/02/2015 0212   TRIG 64 12/02/2015 0212   HDL 38 (L) 12/02/2015 0212   CHOLHDL 3.8 12/02/2015 0212   VLDL 13 12/02/2015 0212   LDLCALC 94 12/02/2015 0212   Hepatic Function Panel     Component Value Date/Time   PROT 6.8 12/20/2017 1728   ALBUMIN 3.7 12/20/2017 1728   AST 17 12/20/2017 1728   ALT 16 (L) 12/20/2017 1728   ALKPHOS 119 12/20/2017 1728   BILITOT 0.4 12/20/2017 1728     ECG  shows NSR with a rate of 78 BPM INDIRECT CALORIMETER done today shows a VO2 of 241 and a REE of 1678.  His calculated basal metabolic rate is 6962 thus his basal metabolic rate is worse than expected.    ASSESSMENT AND PLAN: Other fatigue - Plan: EKG 12-Lead, Vitamin B12, CBC With Differential, Comprehensive metabolic panel, Folate, Hemoglobin A1c, Insulin, random, T3, T4, free, TSH, VITAMIN D 25 Hydroxy (Vit-D Deficiency, Fractures)  Shortness of breath on exertion - Plan: CBC With Differential  Other hyperlipidemia - Plan: Lipid Panel With LDL/HDL Ratio  Depression screening  At risk for diabetes mellitus  Class 3 severe obesity with serious comorbidity and body mass index (BMI) of  40.0 to  44.9 in adult, unspecified obesity type Sean Winters(HCC)  PLAN:  Fatigue Sean LitesGregory was informed that his fatigue may be related to obesity, depression or many other causes. Labs will be ordered, and in the meanwhile Sean LitesGregory has agreed to work on diet, exercise and weight loss to help with fatigue. Proper sleep hygiene was discussed including the need for 7-8 hours of quality sleep each night. A sleep study was not ordered based on symptoms and Epworth score.  Dyspnea on exertion Simmie's shortness of breath appears to be obesity related and exercise induced. He has agreed to work on weight loss and gradually increase exercise to treat his exercise induced shortness of breath. If Sean LitesGregory follows our instructions and loses weight without improvement of his shortness of breath, we will plan to refer to pulmonology. We will monitor this condition regularly. Sean LitesGregory agrees to this plan.  Hyperlipidemia Sean LitesGregory was informed of the American Heart Association Guidelines emphasizing intensive lifestyle modifications as the first line treatment for hyperlipidemia. We discussed many lifestyle modifications today in depth, and Sean LitesGregory will continue to work on decreasing saturated fats such as fatty red meat, butter and many fried foods. He will also increase vegetables and lean protein in his diet and continue to work on exercise and weight loss efforts. We will check labs and Sean LitesGregory agrees to follow up with our clinic in   Depression Screen Sean LitesGregory had a moderately positive depression screening. Depression is commonly associated with obesity and often results in emotional eating behaviors. We will monitor this closely and work on CBT to help improve the non-hunger eating patterns. Referral to Psychology may be required if no improvement is seen as he continues in our clinic.  Diabetes risk counselling Sean LitesGregory was given extended (15 minutes) diabetes prevention counseling today. He is 62 y.o. male and has risk factors  for diabetes including obesity. We discussed intensive lifestyle modifications today with an emphasis on weight loss as well as increasing exercise and decreasing simple carbohydrates in his diet.  Obesity Sean LitesGregory is currently in the action stage of change and his goal is to continue with weight loss efforts. I recommend Sean LitesGregory begin the structured treatment plan as follows:  He has agreed to follow the Category 3 plan Sean LitesGregory has been instructed to eventually work up to a goal of 150 minutes of combined cardio and strengthening exercise per week for weight loss and overall health benefits. We discussed the following Behavioral Modification Strategies today: increasing lean protein intake, decreasing simple carbohydrates , decreasing sodium intake, decrease eating out and work on meal planning and easy cooking plans   He was informed of the importance of frequent follow up visits to maximize his success with intensive lifestyle modifications for his multiple health conditions. He was informed we would discuss his lab results at his next visit unless there is a critical issue that needs to be addressed sooner. Sean LitesGregory agreed to keep his next visit at the agreed upon time to discuss these results.    OBESITY BEHAVIORAL INTERVENTION VISIT  Today's visit was # 1 out of 22.  Starting weight: 250 lbs Starting date: 04/16/18 Today's weight : 250 lbs  Today's date: 04/16/2018 Total lbs lost to date: 0 (Patients must lose 7 lbs in the first 6 months to continue with counseling)   ASK: We discussed the diagnosis of obesity with Sean SavoyGregory D Connon today and Sean LitesGregory agreed to give us permission to discuss obesity behavioral modification therapy today.  ASSESS: Sean LitesGregory has the diagnosis of  obesity and his BMI today is 40.37 [place] is in the action stage of change   ADVISE: Joden was educated on the multiple health risks of obesity as well as the benefit of weight loss to improve his health. He  was advised of the need for long term treatment and the importance of lifestyle modifications.  AGREE: Multiple dietary modification options and treatment options were discussed and  Geovonni agreed to the above obesity treatment plan.   I, Burt Knack, am acting as transcriptionist for Quillian Quince, MD   I have reviewed the above documentation for accuracy and completeness, and I agree with the above. -Quillian Quince, MD

## 2018-04-17 LAB — COMPREHENSIVE METABOLIC PANEL
ALT: 12 IU/L (ref 0–44)
AST: 14 IU/L (ref 0–40)
Albumin/Globulin Ratio: 2 (ref 1.2–2.2)
Albumin: 4.5 g/dL (ref 3.6–4.8)
Alkaline Phosphatase: 145 IU/L — ABNORMAL HIGH (ref 39–117)
BUN/Creatinine Ratio: 11 (ref 10–24)
BUN: 13 mg/dL (ref 8–27)
Bilirubin Total: 0.7 mg/dL (ref 0.0–1.2)
CO2: 22 mmol/L (ref 20–29)
Calcium: 9.2 mg/dL (ref 8.6–10.2)
Chloride: 104 mmol/L (ref 96–106)
Creatinine, Ser: 1.2 mg/dL (ref 0.76–1.27)
GFR calc Af Amer: 73 mL/min/{1.73_m2} (ref >59)
GFR calc non Af Amer: 64 mL/min/{1.73_m2} (ref >59)
Globulin, Total: 2.3 g/dL (ref 1.5–4.5)
Glucose: 100 mg/dL — ABNORMAL HIGH (ref 65–99)
Potassium: 3.9 mmol/L (ref 3.5–5.2)
Sodium: 141 mmol/L (ref 134–144)
Total Protein: 6.8 g/dL (ref 6.0–8.5)

## 2018-04-17 LAB — LIPID PANEL WITH LDL/HDL RATIO
Cholesterol, Total: 154 mg/dL (ref 100–199)
HDL: 37 mg/dL — ABNORMAL LOW (ref >39)
LDL Calculated: 88 mg/dL (ref 0–99)
LDl/HDL Ratio: 2.4 ratio (ref 0.0–3.6)
Triglycerides: 147 mg/dL (ref 0–149)
VLDL Cholesterol Cal: 29 mg/dL (ref 5–40)

## 2018-04-17 LAB — CBC WITH DIFFERENTIAL
Basophils Absolute: 0.1 10*3/uL (ref 0.0–0.2)
Basos: 1 %
EOS (ABSOLUTE): 0.2 10*3/uL (ref 0.0–0.4)
Eos: 2 %
Hematocrit: 42.5 % (ref 37.5–51.0)
Hemoglobin: 14 g/dL (ref 13.0–17.7)
Immature Grans (Abs): 0 10*3/uL (ref 0.0–0.1)
Immature Granulocytes: 0 %
Lymphocytes Absolute: 1.8 10*3/uL (ref 0.7–3.1)
Lymphs: 19 %
MCH: 29.3 pg (ref 26.6–33.0)
MCHC: 32.9 g/dL (ref 31.5–35.7)
MCV: 89 fL (ref 79–97)
Monocytes Absolute: 0.8 10*3/uL (ref 0.1–0.9)
Monocytes: 9 %
Neutrophils Absolute: 6.5 10*3/uL (ref 1.4–7.0)
Neutrophils: 69 %
RBC: 4.78 x10E6/uL (ref 4.14–5.80)
RDW: 14.4 % (ref 12.3–15.4)
WBC: 9.3 10*3/uL (ref 3.4–10.8)

## 2018-04-17 LAB — TSH: TSH: 1.17 u[IU]/mL (ref 0.450–4.500)

## 2018-04-17 LAB — INSULIN, RANDOM: INSULIN: 15.2 u[IU]/mL (ref 2.6–24.9)

## 2018-04-17 LAB — T3: T3, Total: 136 ng/dL (ref 71–180)

## 2018-04-17 LAB — T4, FREE: Free T4: 1.26 ng/dL (ref 0.82–1.77)

## 2018-04-17 LAB — VITAMIN B12: Vitamin B-12: 389 pg/mL (ref 232–1245)

## 2018-04-17 LAB — VITAMIN D 25 HYDROXY (VIT D DEFICIENCY, FRACTURES): Vit D, 25-Hydroxy: 24.1 ng/mL — ABNORMAL LOW (ref 30.0–100.0)

## 2018-04-17 LAB — HEMOGLOBIN A1C
Est. average glucose Bld gHb Est-mCnc: 123 mg/dL
Hgb A1c MFr Bld: 5.9 % — ABNORMAL HIGH (ref 4.8–5.6)

## 2018-04-17 LAB — FOLATE: Folate: 17.6 ng/mL (ref >3.0)

## 2018-05-05 ENCOUNTER — Ambulatory Visit (INDEPENDENT_AMBULATORY_CARE_PROVIDER_SITE_OTHER): Payer: BLUE CROSS/BLUE SHIELD | Admitting: Family Medicine

## 2018-05-05 VITALS — BP 146/80 | HR 83 | Ht 66.0 in | Wt 241.0 lb

## 2018-05-05 DIAGNOSIS — E559 Vitamin D deficiency, unspecified: Secondary | ICD-10-CM

## 2018-05-05 DIAGNOSIS — Z9189 Other specified personal risk factors, not elsewhere classified: Secondary | ICD-10-CM | POA: Diagnosis not present

## 2018-05-05 DIAGNOSIS — R7303 Prediabetes: Secondary | ICD-10-CM | POA: Diagnosis not present

## 2018-05-05 DIAGNOSIS — Z6839 Body mass index (BMI) 39.0-39.9, adult: Secondary | ICD-10-CM

## 2018-05-05 MED ORDER — METFORMIN HCL 500 MG PO TABS: 500.0000 mg | ORAL_TABLET | Freq: Every day | ORAL | 0 refills | Status: DC

## 2018-05-05 MED ORDER — VITAMIN D (ERGOCALCIFEROL) 1.25 MG (50000 UNIT) PO CAPS: 50000.0000 [IU] | ORAL_CAPSULE | ORAL | 0 refills | Status: DC

## 2018-05-05 NOTE — Progress Notes (Signed)
met 

## 2018-05-06 NOTE — Progress Notes (Signed)
Office: [phone]  /  Fax: [phone]   HPI:   Chief Complaint: OBESITY Sean Winters is here to discuss his progress with his obesity treatment plan. He is on the Category 3 plan and is following his eating plan approximately 95 % of the time. He states he is walking for 20-40 minutes 7 times per week. Sean Winters did very well with weight loss on his Category 3 plan. His hunger was mostly controlled and he liked most of his food options.  His weight is 241 lb (109.3 kg) today and has had a weight loss of 9 pounds over a period of 2 to 3 weeks since his last visit. He has lost 9 lbs since starting treatment with [place].  Vitamin D Deficiency Sean Winters has a new diagnosis of vitamin D deficiency. He is not on Vit D and his level is not at goal. He denies nausea, vomiting or muscle weakness.  Pre-Diabetes Sean Winters has a new diagnosis of pre-diabetes based on his elevated Hgb A1c and was informed this puts him at greater risk of developing diabetes. He has a family history of diabetes mellitus II, and he note  Increased polyphagia especially with increased simple carbohydrates. He is not taking metformin currently and continues to work on diet and exercise to decrease risk of diabetes. He denies nausea or hypoglycemia.  At risk for diabetes Sean Winters is at higher than average risk for developing diabetes due to his obesity and pre-diabetes. He currently denies polyuria or polydipsia.  ALLERGIES: Allergies  Allergen Reactions  . Haloperidol Other (See Comments)    Fatigue  . Ibuprofen Other (See Comments)    Cannot take while on lithium  . Sulfa Antibiotics Rash  . Sulfamethoxazole Rash    MEDICATIONS: Current Outpatient Medications on File Prior to Visit  Medication Sig Dispense Refill  . ASHWAGANDHA PO Take 250 mg by mouth once.    [name] aspirin EC 81 MG tablet Take 81 mg by mouth daily.    [name] atorvastatin (LIPITOR) 20 MG tablet Take 20 mg by mouth Daily.     . furosemide (LASIX) 40 MG tablet  Take 2 tablets (80 mg total) by mouth daily. 180 tablet 3  . lamoTRIgine (LAMICTAL) 25 MG tablet Take 75 mg by mouth daily.  4  . lithium carbonate (LITHOBID) 300 MG CR tablet Take 900 mg by mouth at bedtime.  4  . OVER THE COUNTER MEDICATION Take 3 capsules by mouth daily. CocoaVia- for memory and blood flow    . OVER THE COUNTER MEDICATION Take 2 tablets by mouth daily. For memory - Sensoril 250 mg tablets    . oxybutynin (DITROPAN-XL) 10 MG 24 hr tablet Take 10 mg by mouth daily.    . potassium chloride SA (K-DUR,KLOR-CON) 20 MEQ tablet Take 2 tablets (40 mEq total) by mouth daily. 180 tablet 3  . pramipexole (MIRAPEX) 1.5 MG tablet Take 1.5 mg by mouth at bedtime.     [name] zolpidem (AMBIEN) 10 MG tablet Take 10 mg by mouth at bedtime.      No current facility-administered medications on file prior to visit.     PAST MEDICAL HISTORY: Past Medical History:  Diagnosis Date  . Anxiety   . Bipolar disorder (HCC)   . BPH (benign prostatic hypertrophy)   . Chest pain   . Complete heart block (HCC)    a. s/p Medtronic PPM 2017  . Cystitis, acute   . Depression   . Diverticulitis   . Dyslipidemia   . Dyspnea   .  Edema   . Lumbago   . Nephrolithiasis   . Pulmonary embolism (HCC) 10/2009    PAST SURGICAL HISTORY: Past Surgical History:  Procedure Laterality Date  . EP IMPLANTABLE DEVICE N/A 12/04/2015   Procedure: Pacemaker Implant;  Surgeon: Marinus Maw, MD;  Location: [facility] INVASIVE CV LAB;  Service: Cardiovascular;  Laterality: N/A;  . FINGER FRACTURE SURGERY  2004  . KIDNEY STONES     REMOVAL   . KNEE ARTHROSCOPY     RIGHT KNEE  . TONSILLECTOMY      SOCIAL HISTORY: Social History   Tobacco Use  . Smoking status: Former Smoker    Packs/day: 1.00    Years: 18.00    Pack years: 18.00    Types: Cigarettes    Last attempt to quit: 09/03/1987    Years since quitting: 30.6  . Smokeless tobacco: Never Used  Substance Use Topics  . Alcohol use: No  . Drug use: No    FAMILY  HISTORY: Family History  Problem Relation Age of Onset  . Heart attack Father 31  . Hyperlipidemia Father   . Diabetes Father   . Hypertension Father   . Heart disease Father   . Fibromyalgia Sister   . Stroke Mother   . Arrhythmia Mother        atrial fib.  . Depression Mother   . Anxiety disorder Mother   . Bipolar disorder Mother     ROS: Review of Systems  Constitutional: Positive for weight loss.  Gastrointestinal: Negative for nausea and vomiting.  Genitourinary: Negative for frequency.  Musculoskeletal:       Negative muscle weakness  Endo/Heme/Allergies: Negative for polydipsia.       Positive polyphagia Negative hypoglycemia    PHYSICAL EXAM: Blood pressure (!) 146/80, pulse 83, height 5\' 6"  (1.676 m), weight 241 lb (109.3 kg), SpO2 95 %. Body mass index is 38.9 kg/m. Physical Exam  Constitutional: He is oriented to person, place, and time. He appears well-developed and well-nourished.  Cardiovascular: Normal rate.  Pulmonary/Chest: Effort normal.  Musculoskeletal: Normal range of motion.  Neurological: He is oriented to person, place, and time.  Skin: Skin is warm and dry.  Psychiatric: He has a normal mood and affect. His behavior is normal.  Vitals reviewed.   RECENT LABS AND TESTS: BMET    Component Value Date/Time   NA 141 04/16/2018 0943   K 3.9 04/16/2018 0943   CL 104 04/16/2018 0943   CO2 22 04/16/2018 0943   GLUCOSE 100 (H) 04/16/2018 0943   GLUCOSE 95 12/20/2017 1728   BUN 13 04/16/2018 0943   CREATININE 1.20 04/16/2018 0943   CALCIUM 9.2 04/16/2018 0943   GFRNONAA 64 04/16/2018 0943   GFRAA 73 04/16/2018 0943   Lab Results  Component Value Date   HGBA1C 5.9 (H) 04/16/2018   HGBA1C 5.8 (H) 12/02/2015   HGBA1C 5.4 03/14/2012   Lab Results  Component Value Date   INSULIN 15.2 04/16/2018   CBC    Component Value Date/Time   WBC 9.3 04/16/2018 0943   WBC 8.9 12/20/2017 1728   RBC 4.78 04/16/2018 0943   RBC 4.68 12/20/2017  1728   HGB 14.0 04/16/2018 0943   HCT 42.5 04/16/2018 0943   PLT 251 12/20/2017 1728   MCV 89 04/16/2018 0943   MCH 29.3 04/16/2018 0943   MCH 29.9 12/20/2017 1728   MCHC 32.9 04/16/2018 0943   MCHC 33.6 12/20/2017 1728   RDW 14.4 04/16/2018 0943   LYMPHSABS 1.8  04/16/2018 0943   MONOABS 0.9 12/01/2015 1915   EOSABS 0.2 04/16/2018 0943   BASOSABS 0.1 04/16/2018 0943   Iron/TIBC/Ferritin/ %Sat No results found for: IRON, TIBC, FERRITIN, IRONPCTSAT Lipid Panel     Component Value Date/Time   CHOL 154 04/16/2018 0943   TRIG 147 04/16/2018 0943   HDL 37 (L) 04/16/2018 0943   CHOLHDL 3.8 12/02/2015 0212   VLDL 13 12/02/2015 0212   LDLCALC 88 04/16/2018 0943   Hepatic Function Panel     Component Value Date/Time   PROT 6.8 04/16/2018 0943   ALBUMIN 4.5 04/16/2018 0943   AST 14 04/16/2018 0943   ALT 12 04/16/2018 0943   ALKPHOS 145 (H) 04/16/2018 0943   BILITOT 0.7 04/16/2018 0943   Results for CAEDON, KENISTON (MRN [id number]) as of 05/06/2018 08:37  Ref. Range 04/16/2018 09:43  Vitamin D, 25-Hydroxy Latest Ref Range: 30.0 - 100.0 ng/mL 24.1 (L)    ASSESSMENT AND PLAN: Prediabetes - Plan: metFORMIN (GLUCOPHAGE) 500 MG tablet  Vitamin D deficiency - Plan: Vitamin D, Ergocalciferol, (DRISDOL) 50000 units CAPS capsule  At risk for diabetes mellitus  Class 2 severe obesity with serious comorbidity and body mass index (BMI) of 39.0 to 39.9 in adult, unspecified obesity type (HCC)  PLAN:  Vitamin D Deficiency Sean Winters was informed that low vitamin D levels contributes to fatigue and are associated with obesity, breast, and colon cancer. Sean Winters agrees to start prescription Vit D @50 ,000 IU every week #4 with no refills. He will follow up for routine testing of vitamin D, at least 2-3 times per year. He was informed of the risk of over-replacement of vitamin D and agrees to not increase his dose unless he discusses this with [place] first. Sean Winters agrees to follow up with our clinic  in 2 to 3 weeks.  Pre-Diabetes Sean Winters will continue to work on weight loss, exercise, and decreasing simple carbohydrates in his diet to help decrease the risk of diabetes. We dicussed metformin including benefits and risks. He was informed that eating too many simple carbohydrates or too many calories at one sitting increases the likelihood of GI side effects. Sean Winters agrees to start metformin 500 mg q AM #30 with no refills. Sean Winters agrees to follow up with our clinic in 2 to 3 weeks as directed to monitor his progress.  Diabetes risk counselling Sean Winters was given extended (30 minutes) diabetes prevention counseling today. He is 62 y.o. male and has risk factors for diabetes including obesity and pre-diabetes. We discussed intensive lifestyle modifications today with an emphasis on weight loss as well as increasing exercise and decreasing simple carbohydrates in his diet.  Obesity Sean Winters is currently in the action stage of change. As such, his goal is to continue with weight loss efforts He has agreed to follow the Category 3 plan Sean Winters has been instructed to work up to a goal of 150 minutes of combined cardio and strengthening exercise per week for weight loss and overall health benefits. We discussed the following Behavioral Modification Strategies today: increasing lean protein intake and decreasing simple carbohydrates    Sean Winters has agreed to follow up with our clinic in 2 to 3 weeks. He was informed of the importance of frequent follow up visits to maximize his success with intensive lifestyle modifications for his multiple health conditions.   OBESITY BEHAVIORAL INTERVENTION VISIT  Today's visit was # 2   Starting weight: 250 lbs Starting date: 04/16/18 Today's weight : 241 lbs  Today's date: 05/05/2018 Total lbs lost  to date: 83    ASK: We discussed the diagnosis of obesity with Sean Winters today and Sean Winters agreed to give [place] permission to discuss obesity behavioral  modification therapy today.  ASSESS: Sean Winters has the diagnosis of obesity and his BMI today is 38.92 Sean Winters is in the action stage of change   ADVISE: Sean Winters was educated on the multiple health risks of obesity as well as the benefit of weight loss to improve his health. He was advised of the need for long term treatment and the importance of lifestyle modifications to improve his current health and to decrease his risk of future health problems.  AGREE: Multiple dietary modification options and treatment options were discussed and  Sean Winters agreed to follow the recommendations documented in the above note.  ARRANGE: Sean Winters was educated on the importance of frequent visits to treat obesity as outlined per CMS and USPSTF guidelines and agreed to schedule his next follow up appointment today.  I, Burt Knack, am acting as transcriptionist for Quillian Quince, MD  I have reviewed the above documentation for accuracy and completeness, and I agree with the above. -Quillian Quince, MD

## 2018-05-26 ENCOUNTER — Ambulatory Visit (INDEPENDENT_AMBULATORY_CARE_PROVIDER_SITE_OTHER): Payer: BLUE CROSS/BLUE SHIELD | Admitting: Family Medicine

## 2018-05-26 ENCOUNTER — Encounter (INDEPENDENT_AMBULATORY_CARE_PROVIDER_SITE_OTHER): Payer: Self-pay | Admitting: Family Medicine

## 2018-05-26 VITALS — BP 119/75 | HR 86 | Temp 98.1°F | Ht 66.0 in | Wt 242.0 lb

## 2018-05-26 DIAGNOSIS — R7303 Prediabetes: Secondary | ICD-10-CM | POA: Diagnosis not present

## 2018-05-26 DIAGNOSIS — Z6839 Body mass index (BMI) 39.0-39.9, adult: Secondary | ICD-10-CM | POA: Diagnosis not present

## 2018-05-26 MED ORDER — METFORMIN HCL 500 MG PO TABS: 500.0000 mg | ORAL_TABLET | Freq: Every day | ORAL | 0 refills | Status: DC

## 2018-05-27 ENCOUNTER — Other Ambulatory Visit (INDEPENDENT_AMBULATORY_CARE_PROVIDER_SITE_OTHER): Payer: Self-pay | Admitting: Family Medicine

## 2018-05-27 DIAGNOSIS — E559 Vitamin D deficiency, unspecified: Secondary | ICD-10-CM

## 2018-05-27 NOTE — Progress Notes (Signed)
Office: [phone]  /  Fax: [phone]   HPI:   Chief Complaint: OBESITY Sean Winters is here to discuss his progress with his obesity treatment plan. He is on the  follow the Category 3 plan and is following his eating plan approximately 90 % of the time. He states he is exercising by walking for 20 minutes 7 times per week. Sean Winters has gotten off track a bit, he is spreading out his meals not notes some evening cravings.  His weight is 242 lb (109.8 kg) today and has not lost weight since his last visit. He has lost 8 lbs since starting treatment with [place].  Pre-Diabetes Sean Winters has a diagnosis of prediabetes based on his elevated HgA1c of 5.9 and was informed this puts him at greater risk of developing diabetes. He is taking metformin currently and continues to work on diet and exercise to decrease risk of diabetes. He denies nausea/vomiting or hypoglycemia.   ALLERGIES: Allergies  Allergen Reactions  . Haloperidol Other (See Comments)    Fatigue  . Ibuprofen Other (See Comments)    Cannot take while on lithium  . Sulfa Antibiotics Rash  . Sulfamethoxazole Rash    MEDICATIONS: Current Outpatient Medications on File Prior to Visit  Medication Sig Dispense Refill  . ASHWAGANDHA PO Take 250 mg by mouth once.    [name] aspirin EC 81 MG tablet Take 81 mg by mouth daily.    [name] atorvastatin (LIPITOR) 20 MG tablet Take 20 mg by mouth Daily.     . furosemide (LASIX) 40 MG tablet Take 2 tablets (80 mg total) by mouth daily. 180 tablet 3  . lamoTRIgine (LAMICTAL) 25 MG tablet Take 75 mg by mouth daily.  4  . lithium carbonate (LITHOBID) 300 MG CR tablet Take 900 mg by mouth at bedtime.  4  . OVER THE COUNTER MEDICATION Take 3 capsules by mouth daily. CocoaVia- for memory and blood flow    . OVER THE COUNTER MEDICATION Take 2 tablets by mouth daily. For memory - Sensoril 250 mg tablets    . oxybutynin (DITROPAN-XL) 10 MG 24 hr tablet Take 10 mg by mouth daily.    . potassium chloride SA  (K-DUR,KLOR-CON) 20 MEQ tablet Take 2 tablets (40 mEq total) by mouth daily. 180 tablet 3  . pramipexole (MIRAPEX) 1.5 MG tablet Take 1.5 mg by mouth at bedtime.     . Vitamin D, Ergocalciferol, (DRISDOL) 50000 units CAPS capsule Take 1 capsule (50,000 Units total) by mouth every 7 (seven) days. 4 capsule 0  . zolpidem (AMBIEN) 10 MG tablet Take 10 mg by mouth at bedtime.      No current facility-administered medications on file prior to visit.     PAST MEDICAL HISTORY: Past Medical History:  Diagnosis Date  . Anxiety   . Bipolar disorder (HCC)   . BPH (benign prostatic hypertrophy)   . Chest pain   . Complete heart block (HCC)    a. s/p Medtronic PPM 2017  . Cystitis, acute   . Depression   . Diverticulitis   . Dyslipidemia   . Dyspnea   . Edema   . Lumbago   . Nephrolithiasis   . Pulmonary embolism (HCC) 10/2009    PAST SURGICAL HISTORY: Past Surgical History:  Procedure Laterality Date  . EP IMPLANTABLE DEVICE N/A 12/04/2015   Procedure: Pacemaker Implant;  Surgeon: Marinus Maw, MD;  Location: [facility] INVASIVE CV LAB;  Service: Cardiovascular;  Laterality: N/A;  . FINGER FRACTURE SURGERY  2004  .  KIDNEY STONES     REMOVAL   . KNEE ARTHROSCOPY     RIGHT KNEE  . TONSILLECTOMY      SOCIAL HISTORY: Social History   Tobacco Use  . Smoking status: Former Smoker    Packs/day: 1.00    Years: 18.00    Pack years: 18.00    Types: Cigarettes    Last attempt to quit: 09/03/1987    Years since quitting: 30.7  . Smokeless tobacco: Never Used  Substance Use Topics  . Alcohol use: No  . Drug use: No    FAMILY HISTORY: Family History  Problem Relation Age of Onset  . Heart attack Father 76  . Hyperlipidemia Father   . Diabetes Father   . Hypertension Father   . Heart disease Father   . Fibromyalgia Sister   . Stroke Mother   . Arrhythmia Mother        atrial fib.  . Depression Mother   . Anxiety disorder Mother   . Bipolar disorder Mother     ROS: Review of  Systems  Constitutional: Negative for weight loss.  Gastrointestinal: Negative for nausea and vomiting.  Endo/Heme/Allergies:       Negative for hypoglycemia     PHYSICAL EXAM: Blood pressure 119/75, pulse 86, temperature 98.1 F (36.7 C), temperature source Oral, height 5\' 6"  (1.676 m), weight 242 lb (109.8 kg), SpO2 95 %. Body mass index is 39.06 kg/m. Physical Exam  Constitutional: He is oriented to person, place, and time. He appears well-developed and well-nourished.  HENT:  Head: Normocephalic.  Cardiovascular: Normal rate.  Pulmonary/Chest: Effort normal.  Musculoskeletal: Normal range of motion.  Neurological: He is oriented to person, place, and time.  Skin: Skin is warm and dry.  Psychiatric: He has a normal mood and affect. His behavior is normal.  Vitals reviewed.   RECENT LABS AND TESTS: BMET    Component Value Date/Time   NA 141 04/16/2018 0943   K 3.9 04/16/2018 0943   CL 104 04/16/2018 0943   CO2 22 04/16/2018 0943   GLUCOSE 100 (H) 04/16/2018 0943   GLUCOSE 95 12/20/2017 1728   BUN 13 04/16/2018 0943   CREATININE 1.20 04/16/2018 0943   CALCIUM 9.2 04/16/2018 0943   GFRNONAA 64 04/16/2018 0943   GFRAA 73 04/16/2018 0943   Lab Results  Component Value Date   HGBA1C 5.9 (H) 04/16/2018   HGBA1C 5.8 (H) 12/02/2015   HGBA1C 5.4 03/14/2012   Lab Results  Component Value Date   INSULIN 15.2 04/16/2018   CBC    Component Value Date/Time   WBC 9.3 04/16/2018 0943   WBC 8.9 12/20/2017 1728   RBC 4.78 04/16/2018 0943   RBC 4.68 12/20/2017 1728   HGB 14.0 04/16/2018 0943   HCT 42.5 04/16/2018 0943   PLT 251 12/20/2017 1728   MCV 89 04/16/2018 0943   MCH 29.3 04/16/2018 0943   MCH 29.9 12/20/2017 1728   MCHC 32.9 04/16/2018 0943   MCHC 33.6 12/20/2017 1728   RDW 14.4 04/16/2018 0943   LYMPHSABS 1.8 04/16/2018 0943   MONOABS 0.9 12/01/2015 1915   EOSABS 0.2 04/16/2018 0943   BASOSABS 0.1 04/16/2018 0943   Iron/TIBC/Ferritin/ %Sat No results  found for: IRON, TIBC, FERRITIN, IRONPCTSAT Lipid Panel     Component Value Date/Time   CHOL 154 04/16/2018 0943   TRIG 147 04/16/2018 0943   HDL 37 (L) 04/16/2018 0943   CHOLHDL 3.8 12/02/2015 0212   VLDL 13 12/02/2015 0212   LDLCALC  88 04/16/2018 0943   Hepatic Function Panel     Component Value Date/Time   PROT 6.8 04/16/2018 0943   ALBUMIN 4.5 04/16/2018 0943   AST 14 04/16/2018 0943   ALT 12 04/16/2018 0943   ALKPHOS 145 (H) 04/16/2018 0943   BILITOT 0.7 04/16/2018 0943     ASSESSMENT AND PLAN: Prediabetes - Plan: metFORMIN (GLUCOPHAGE) 500 MG tablet  Class 2 severe obesity with serious comorbidity and body mass index (BMI) of 39.0 to 39.9 in adult, unspecified obesity type (HCC)  PLAN: Pre-Diabetes Sean LitesGregory will continue to work on weight loss, exercise, and decreasing simple carbohydrates in his diet to help decrease the risk of diabetes. We dicussed metformin including benefits and risks. He was informed that eating too many simple carbohydrates or too many calories at one sitting increases the likelihood of GI side effects. Sean LitesGregory agrees to continue Metformin 500 mg daily #30 with no refills. Sean LitesGregory agreed to follow up with us as directed to monitor his progress.  I spent > than 50% of the 25 minute visit on counseling as documented in the note.  Obesity Sean LitesGregory is currently in the action stage of change. As such, his goal is to continue with weight loss efforts He has agreed to follow the Category 3 plan Sean LitesGregory has been instructed to work up to a goal of 150 minutes of combined cardio and strengthening exercise per week for weight loss and overall health benefits. We discussed the following Behavioral Modification Stratagies today: increasing lean protein intake and decreasing simple carbohydrates    Sean LitesGregory has agreed to follow up with our clinic in 2-3 weeks. He was informed of the importance of frequent follow up visits to maximize his success with intensive  lifestyle modifications for his multiple health conditions.   OBESITY BEHAVIORAL INTERVENTION VISIT  Today's visit was # 3   Starting weight: 250 lb Starting date: 04/16/18 Today's weight : 242 lb Today's date: 05/26/18 Total lbs lost to date: 8 lb    ASK: We discussed the diagnosis of obesity with Sean SavoyGregory D Winters today and Sean LitesGregory agreed to give us permission to discuss obesity behavioral modification therapy today.  ASSESS: Sean LitesGregory has the diagnosis of obesity and his BMI today is 39.08 Sean LitesGregory is in the action stage of change   ADVISE: Sean LitesGregory was educated on the multiple health risks of obesity as well as the benefit of weight loss to improve his health. He was advised of the need for long term treatment and the importance of lifestyle modifications to improve his current health and to decrease his risk of future health problems.  AGREE: Multiple dietary modification options and treatment options were discussed and  Sean LitesGregory agreed to follow the recommendations documented in the above note.  ARRANGE: Sean LitesGregory was educated on the importance of frequent visits to treat obesity as outlined per CMS and USPSTF guidelines and agreed to schedule his next follow up appointment today.  I, Jeralene PetersAshleigh Haynes, am acting as transcriptionist for Quillian Quincearen Monserath Neff, MD  I have reviewed the above documentation for accuracy and completeness, and I agree with the above. -Quillian Quincearen Nikolaus Pienta, MD

## 2018-05-29 ENCOUNTER — Other Ambulatory Visit (INDEPENDENT_AMBULATORY_CARE_PROVIDER_SITE_OTHER): Payer: Self-pay | Admitting: Family Medicine

## 2018-05-29 DIAGNOSIS — R7303 Prediabetes: Secondary | ICD-10-CM

## 2018-06-10 ENCOUNTER — Ambulatory Visit (INDEPENDENT_AMBULATORY_CARE_PROVIDER_SITE_OTHER): Payer: BLUE CROSS/BLUE SHIELD | Admitting: Family Medicine

## 2018-06-10 VITALS — BP 109/68 | HR 71 | Ht 66.0 in | Wt 239.0 lb

## 2018-06-10 DIAGNOSIS — E559 Vitamin D deficiency, unspecified: Secondary | ICD-10-CM | POA: Diagnosis not present

## 2018-06-10 DIAGNOSIS — R7303 Prediabetes: Secondary | ICD-10-CM | POA: Diagnosis not present

## 2018-06-10 DIAGNOSIS — Z9189 Other specified personal risk factors, not elsewhere classified: Secondary | ICD-10-CM

## 2018-06-10 DIAGNOSIS — Z6838 Body mass index (BMI) 38.0-38.9, adult: Secondary | ICD-10-CM

## 2018-06-10 MED ORDER — METFORMIN HCL 500 MG PO TABS: 500.0000 mg | ORAL_TABLET | Freq: Every day | ORAL | 0 refills | Status: DC

## 2018-06-10 MED ORDER — VITAMIN D (ERGOCALCIFEROL) 1.25 MG (50000 UNIT) PO CAPS: 50000.0000 [IU] | ORAL_CAPSULE | ORAL | 0 refills | Status: DC

## 2018-06-15 ENCOUNTER — Ambulatory Visit (INDEPENDENT_AMBULATORY_CARE_PROVIDER_SITE_OTHER): Payer: BLUE CROSS/BLUE SHIELD | Admitting: *Deleted

## 2018-06-15 ENCOUNTER — Telehealth: Payer: Self-pay | Admitting: Cardiology

## 2018-06-15 DIAGNOSIS — I442 Atrioventricular block, complete: Secondary | ICD-10-CM | POA: Diagnosis not present

## 2018-06-15 NOTE — Progress Notes (Signed)
Office: [phone]  /  Fax: [phone]   HPI:   Chief Complaint: OBESITY Sean Winters is here to discuss his progress with his obesity treatment plan. He is on the Category 3 plan and is following his eating plan approximately 75-80 % of the time. He states he is walking the dog for 20-30 minutes 7 times per week. Sean Winters continues to do well with weight loss on his Category 3 plan. 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  His weight is 239 lb (108.4 kg) today and has had a weight loss of 3 pounds over a period of 2 weeks since his last visit. He has lost 11 lbs since starting treatment with Sean Winters.  Pre-Diabetes Sean Winters has a diagnosis of pre-diabetes based on his elevated Hgb A1c and was informed this puts him at greater risk of developing diabetes. He is stable on metformin and diet prescription, and continues to work on diet and exercise to decrease risk of diabetes. He denies nausea, vomiting, or hypoglycemia.  At risk for diabetes Sean Winters is at higher than average risk for developing diabetes due to his obesity and pre-diabetes. He currently denies polyuria or polydipsia.  Vitamin D Deficiency Sean Winters has a diagnosis of vitamin D deficiency. He is stable on prescription Vit D and denies nausea, vomiting or muscle weakness.  ALLERGIES: Allergies  Allergen Reactions  . Haloperidol Other (See Comments)    Fatigue  . Ibuprofen Other (See Comments)    Cannot take while on lithium  . Sulfa Antibiotics Rash  . Sulfamethoxazole Rash    MEDICATIONS: Current Outpatient Medications on File Prior to Visit  Medication Sig Dispense Refill  . ASHWAGANDHA PO Take 250 mg by mouth once.    [name] aspirin EC 81 MG tablet Take 81 mg by mouth daily.    [name] atorvastatin (LIPITOR) 20 MG tablet Take 20 mg by mouth Daily.     [name] lamoTRIgine (LAMICTAL) 25 MG tablet Take 75 mg by mouth daily.  4  . lithium carbonate (LITHOBID) 300 MG CR tablet Take  900 mg by mouth at bedtime.  4  . OVER THE COUNTER MEDICATION Take 3 capsules by mouth daily. CocoaVia- for memory and blood flow    . OVER THE COUNTER MEDICATION Take 2 tablets by mouth daily. For memory - Sensoril 250 mg tablets    . oxybutynin (DITROPAN-XL) 10 MG 24 hr tablet Take 10 mg by mouth daily.    . pramipexole (MIRAPEX) 1.5 MG tablet Take 1.5 mg by mouth at bedtime.     [name] zolpidem (AMBIEN) 10 MG tablet Take 10 mg by mouth at bedtime.     . furosemide (LASIX) 40 MG tablet Take 2 tablets (80 mg total) by mouth daily. 180 tablet 3  . potassium chloride SA (K-DUR,KLOR-CON) 20 MEQ tablet Take 2 tablets (40 mEq total) by mouth daily. 180 tablet 3   No current facility-administered medications on file prior to visit.     PAST MEDICAL HISTORY: Past Medical History:  Diagnosis Date  . Anxiety   . Bipolar disorder (HCC)   . BPH (benign prostatic hypertrophy)   . Chest pain   . Complete heart block (HCC)    a. s/p Medtronic PPM 2017  . Cystitis, acute   . Depression   . Diverticulitis   . Dyslipidemia   . Dyspnea   . Edema   . Lumbago   . Nephrolithiasis   . Pulmonary embolism (HCC)  10/2009    PAST SURGICAL HISTORY: Past Surgical History:  Procedure Laterality Date  . EP IMPLANTABLE DEVICE N/A 12/04/2015   Procedure: Pacemaker Implant;  Surgeon: Marinus Maw, MD;  Location: [facility] INVASIVE CV LAB;  Service: Cardiovascular;  Laterality: N/A;  . FINGER FRACTURE SURGERY  2004  . KIDNEY STONES     REMOVAL   . KNEE ARTHROSCOPY     RIGHT KNEE  . TONSILLECTOMY      SOCIAL HISTORY: Social History   Tobacco Use  . Smoking status: Former Smoker    Packs/day: 1.00    Years: 18.00    Pack years: 18.00    Types: Cigarettes    Last attempt to quit: 09/03/1987    Years since quitting: 30.8  . Smokeless tobacco: Never Used  Substance Use Topics  . Alcohol use: No  . Drug use: No    FAMILY HISTORY: Family History  Problem Relation Age of Onset  . Heart attack Father 70  .  Hyperlipidemia Father   . Diabetes Father   . Hypertension Father   . Heart disease Father   . Fibromyalgia Sister   . Stroke Mother   . Arrhythmia Mother        atrial fib.  . Depression Mother   . Anxiety disorder Mother   . Bipolar disorder Mother     ROS: Review of Systems  Constitutional: Positive for weight loss.  Gastrointestinal: Negative for nausea and vomiting.  Genitourinary: Negative for frequency.  Musculoskeletal:       Negative muscle weakness  Endo/Heme/Allergies: Negative for polydipsia.       Negative hypoglycemia    PHYSICAL EXAM: Blood pressure 109/68, pulse 71, height 5\' 6"  (1.676 m), weight 239 lb (108.4 kg), SpO2 95 %. Body mass index is 38.58 kg/m. Physical Exam  Constitutional: He is oriented to person, place, and time. He appears well-developed and well-nourished.  Cardiovascular: Normal rate.  Pulmonary/Chest: Effort normal.  Musculoskeletal: Normal range of motion.  Neurological: He is oriented to person, place, and time.  Skin: Skin is warm and dry.  Psychiatric: He has a normal mood and affect. His behavior is normal.  Vitals reviewed.   RECENT LABS AND TESTS: BMET    Component Value Date/Time   NA 141 04/16/2018 0943   K 3.9 04/16/2018 0943   CL 104 04/16/2018 0943   CO2 22 04/16/2018 0943   GLUCOSE 100 (H) 04/16/2018 0943   GLUCOSE 95 12/20/2017 1728   BUN 13 04/16/2018 0943   CREATININE 1.20 04/16/2018 0943   CALCIUM 9.2 04/16/2018 0943   GFRNONAA 64 04/16/2018 0943   GFRAA 73 04/16/2018 0943   Lab Results  Component Value Date   HGBA1C 5.9 (H) 04/16/2018   HGBA1C 5.8 (H) 12/02/2015   HGBA1C 5.4 03/14/2012   Lab Results  Component Value Date   INSULIN 15.2 04/16/2018   CBC    Component Value Date/Time   WBC 9.3 04/16/2018 0943   WBC 8.9 12/20/2017 1728   RBC 4.78 04/16/2018 0943   RBC 4.68 12/20/2017 1728   HGB 14.0 04/16/2018 0943   HCT 42.5 04/16/2018 0943   PLT 251 12/20/2017 1728   MCV 89 04/16/2018 0943     MCH 29.3 04/16/2018 0943   MCH 29.9 12/20/2017 1728   MCHC 32.9 04/16/2018 0943   MCHC 33.6 12/20/2017 1728   RDW 14.4 04/16/2018 0943   LYMPHSABS 1.8 04/16/2018 0943   MONOABS 0.9 12/01/2015 1915   EOSABS 0.2 04/16/2018 0943   BASOSABS  0.1 04/16/2018 0943   Iron/TIBC/Ferritin/ %Sat No results found for: IRON, TIBC, FERRITIN, IRONPCTSAT Lipid Panel     Component Value Date/Time   CHOL 154 04/16/2018 0943   TRIG 147 04/16/2018 0943   HDL 37 (L) 04/16/2018 0943   CHOLHDL 3.8 12/02/2015 0212   VLDL 13 12/02/2015 0212   LDLCALC 88 04/16/2018 0943   Hepatic Function Panel     Component Value Date/Time   PROT 6.8 04/16/2018 0943   ALBUMIN 4.5 04/16/2018 0943   AST 14 04/16/2018 0943   ALT 12 04/16/2018 0943   ALKPHOS 145 (H) 04/16/2018 0943   BILITOT 0.7 04/16/2018 0943   Results for FELICIANO, WYNTER (MRN [id number]) as of 06/15/2018 10:22  Ref. Range 04/16/2018 09:43  Vitamin D, 25-Hydroxy Latest Ref Range: 30.0 - 100.0 ng/mL 24.1 (L)    ASSESSMENT AND PLAN: Prediabetes - Plan: metFORMIN (GLUCOPHAGE) 500 MG tablet  Vitamin D deficiency - Plan: Vitamin D, Ergocalciferol, (DRISDOL) 50000 units CAPS capsule  At risk for diabetes mellitus  Class 2 severe obesity with serious comorbidity and body mass index (BMI) of 38.0 to 38.9 in adult, unspecified obesity type (HCC)  PLAN:  Pre-Diabetes Rei will continue to work on weight loss, exercise, and decreasing simple carbohydrates in his diet to help decrease the risk of diabetes. We dicussed metformin including benefits and risks. He was informed that eating too many simple carbohydrates or too many calories at one sitting increases the likelihood of GI side effects. Guy agrees to continue taking metformin 500 mg q AM #30 and we will refill for 1 month. Tyleek agrees to follow up with our clinic in 2 to 3 weeks as directed to monitor his progress.  Diabetes risk counselling Larrie was given extended (15 minutes)  diabetes prevention counseling today. He is 62 y.o. male and has risk factors for diabetes including obesity and pre-diabetes. We discussed intensive lifestyle modifications today with an emphasis on weight loss as well as increasing exercise and decreasing simple carbohydrates in his diet.  Vitamin D Deficiency Ailton was informed that low vitamin D levels contributes to fatigue and are associated with obesity, breast, and colon cancer. Nilay agrees to continue taking prescription Vit D @50 ,000 IU every week #4 and we will refill for 1 month. He will follow up for routine testing of vitamin D, at least 2-3 times per year. He was informed of the risk of over-replacement of vitamin D and agrees to not increase his dose unless he discusses this with Sean Winters first. Jordi agrees to follow up with our clinic in 2 to 3 weeks.  Obesity Hason is currently in the action stage of change. As such, his goal is to continue with weight loss efforts He has agreed to follow the Category 3 plan Chez has been instructed to work up to a goal of 150 minutes of combined cardio and strengthening exercise per week for weight loss and overall health benefits. We discussed the following Behavioral Modification Strategies today: increasing lean protein intake, decreasing simple carbohydrates, emotional eating strategies, and no skipping meals   Isahia has agreed to follow up with our clinic in 2 to 3 weeks. He was informed of the importance of frequent follow up visits to maximize his success with intensive lifestyle modifications for his multiple health conditions.   OBESITY BEHAVIORAL INTERVENTION VISIT  Today's visit was # 4   Starting weight: 250 lbs Starting date: 04/16/18 Today's weight : 239 lbs  Today's date: 06/10/2018 Total lbs lost to date:  11    ASK: We discussed the diagnosis of obesity with [name] today and [name] agreed to give Sean Winters permission to discuss obesity behavioral modification  therapy today.  ASSESS: Denys has the diagnosis of obesity and his BMI today is 38.59 Sargent is in the action stage of change   ADVISE: Nikoloz was educated on the multiple health risks of obesity as well as the benefit of weight loss to improve his health. He was advised of the need for long term treatment and the importance of lifestyle modifications to improve his current health and to decrease his risk of future health problems.  AGREE: Multiple dietary modification options and treatment options were discussed and  Lavonne agreed to follow the recommendations documented in the above note.  ARRANGE: Michae was educated on the importance of frequent visits to treat obesity as outlined per CMS and USPSTF guidelines and agreed to schedule his next follow up appointment today.  I, Burt Knack, am acting as transcriptionist for Quillian Quince, MD  I have reviewed the above documentation for accuracy and completeness, and I agree with the above. -Quillian Quince, MD

## 2018-06-15 NOTE — Telephone Encounter (Signed)
Spoke with pt and reminded pt of remote transmission that is due today. Pt verbalized understanding.   

## 2018-06-16 NOTE — Progress Notes (Signed)
Remote pacemaker transmission.   

## 2018-06-18 ENCOUNTER — Encounter: Payer: Self-pay | Admitting: Cardiology

## 2018-06-30 ENCOUNTER — Ambulatory Visit (INDEPENDENT_AMBULATORY_CARE_PROVIDER_SITE_OTHER): Payer: BLUE CROSS/BLUE SHIELD | Admitting: Family Medicine

## 2018-06-30 VITALS — BP 108/69 | HR 74 | Ht 66.0 in | Wt 237.0 lb

## 2018-06-30 DIAGNOSIS — Z6838 Body mass index (BMI) 38.0-38.9, adult: Secondary | ICD-10-CM

## 2018-06-30 DIAGNOSIS — R7303 Prediabetes: Secondary | ICD-10-CM

## 2018-06-30 DIAGNOSIS — Z9189 Other specified personal risk factors, not elsewhere classified: Secondary | ICD-10-CM

## 2018-06-30 DIAGNOSIS — E559 Vitamin D deficiency, unspecified: Secondary | ICD-10-CM | POA: Diagnosis not present

## 2018-07-01 ENCOUNTER — Encounter (INDEPENDENT_AMBULATORY_CARE_PROVIDER_SITE_OTHER): Payer: Self-pay | Admitting: Family Medicine

## 2018-07-01 MED ORDER — VITAMIN D (ERGOCALCIFEROL) 1.25 MG (50000 UNIT) PO CAPS: 50000.0000 [IU] | ORAL_CAPSULE | ORAL | 0 refills | Status: DC

## 2018-07-01 MED ORDER — METFORMIN HCL 500 MG PO TABS: 500.0000 mg | ORAL_TABLET | Freq: Every day | ORAL | 0 refills | Status: DC

## 2018-07-01 NOTE — Progress Notes (Signed)
Office: [phone]  /  Fax: [phone]   HPI:   Chief Complaint: OBESITY Doc is here to discuss his progress with his obesity treatment plan. He is on the  follow the Category 3 plan and is following his eating plan approximately 80 % of the time. He states he is exercising by walking the dog for 20 minutes 7 times per week. Sean Winters continues to do well with weight on his Category 3 plan. He is still craving carbohydrates at night especially while studying late in the evenings. He is currently in grad school.   His weight is 237 lb (107.5 kg) today and has had a weight loss of 2 pounds over a period of 3 weeks since his last visit. He has lost 13 lbs since starting treatment with [place].  Pre-Diabetes [name] has a diagnosis of prediabetes based on his elevated HgA1c and was informed this puts him at greater risk of developing diabetes. He is taking metformin currently and continues to work on diet and exercise to decrease risk of diabetes. He denies nausea, vomiting, or hypoglycemia.  Vitamin D deficiency Denton has a diagnosis of vitamin D deficiency. He is currently taking vit D but not yet at goal and denies nausea, vomiting or muscle weakness.  At risk for osteopenia and osteoporosis Nesbit is at higher risk of osteopenia and osteoporosis due to vitamin D deficiency.   ALLERGIES: Allergies  Allergen Reactions  . Haloperidol Other (See Comments)    Fatigue  . Ibuprofen Other (See Comments)    Cannot take while on lithium  . Sulfa Antibiotics Rash  . Sulfamethoxazole Rash    MEDICATIONS: Current Outpatient Medications on File Prior to Visit  Medication Sig Dispense Refill  . ASHWAGANDHA PO Take 250 mg by mouth once.    [name] aspirin EC 81 MG tablet Take 81 mg by mouth daily.    [name] atorvastatin (LIPITOR) 20 MG tablet Take 20 mg by mouth Daily.     [name] lamoTRIgine (LAMICTAL) 25 MG tablet Take 75 mg by mouth daily.  4  . lithium carbonate (LITHOBID) 300 MG CR tablet Take 900  mg by mouth at bedtime.  4  . OVER THE COUNTER MEDICATION Take 3 capsules by mouth daily. CocoaVia- for memory and blood flow    . OVER THE COUNTER MEDICATION Take 2 tablets by mouth daily. For memory - Sensoril 250 mg tablets    . oxybutynin (DITROPAN-XL) 10 MG 24 hr tablet Take 10 mg by mouth daily.    . pramipexole (MIRAPEX) 1.5 MG tablet Take 1.5 mg by mouth at bedtime.     [name] zolpidem (AMBIEN) 10 MG tablet Take 10 mg by mouth at bedtime.     . furosemide (LASIX) 40 MG tablet Take 2 tablets (80 mg total) by mouth daily. 180 tablet 3  . potassium chloride SA (K-DUR,KLOR-CON) 20 MEQ tablet Take 2 tablets (40 mEq total) by mouth daily. 180 tablet 3   No current facility-administered medications on file prior to visit.     PAST MEDICAL HISTORY: Past Medical History:  Diagnosis Date  . Anxiety   . Bipolar disorder (HCC)   . BPH (benign prostatic hypertrophy)   . Chest pain   . Complete heart block (HCC)    a. s/p Medtronic PPM 2017  . Cystitis, acute   . Depression   . Diverticulitis   . Dyslipidemia   . Dyspnea   . Edema   . Lumbago   . Nephrolithiasis   . Pulmonary embolism (  HCC) 10/2009    PAST SURGICAL HISTORY: Past Surgical History:  Procedure Laterality Date  . EP IMPLANTABLE DEVICE N/A 12/04/2015   Procedure: Pacemaker Implant;  Surgeon: Marinus Maw, MD;  Location: [facility] INVASIVE CV LAB;  Service: Cardiovascular;  Laterality: N/A;  . FINGER FRACTURE SURGERY  2004  . KIDNEY STONES     REMOVAL   . KNEE ARTHROSCOPY     RIGHT KNEE  . TONSILLECTOMY      SOCIAL HISTORY: Social History   Tobacco Use  . Smoking status: Former Smoker    Packs/day: 1.00    Years: 18.00    Pack years: 18.00    Types: Cigarettes    Last attempt to quit: 09/03/1987    Years since quitting: 30.8  . Smokeless tobacco: Never Used  Substance Use Topics  . Alcohol use: No  . Drug use: No    FAMILY HISTORY: Family History  Problem Relation Age of Onset  . Heart attack Father 88  .  Hyperlipidemia Father   . Diabetes Father   . Hypertension Father   . Heart disease Father   . Fibromyalgia Sister   . Stroke Mother   . Arrhythmia Mother        atrial fib.  . Depression Mother   . Anxiety disorder Mother   . Bipolar disorder Mother     ROS: Review of Systems  Constitutional: Positive for weight loss.  Gastrointestinal: Negative for nausea and vomiting.  Musculoskeletal:       Negative for muscle weakness  Endo/Heme/Allergies:       Negative for  hypoglycemia    PHYSICAL EXAM: Blood pressure 108/69, pulse 74, height 5\' 6"  (1.676 m), weight 237 lb (107.5 kg), SpO2 95 %. Body mass index is 38.25 kg/m. Physical Exam  Constitutional: He is oriented to person, place, and time. He appears well-developed and well-nourished.  HENT:  Head: Normocephalic.  Neck: Normal range of motion.  Cardiovascular: Normal rate.  Pulmonary/Chest: Effort normal.  Musculoskeletal: Normal range of motion.  Neurological: He is alert and oriented to person, place, and time.  Skin: Skin is warm and dry.  Psychiatric: He has a normal mood and affect. His behavior is normal.  Vitals reviewed.   RECENT LABS AND TESTS: BMET    Component Value Date/Time   NA 141 04/16/2018 0943   K 3.9 04/16/2018 0943   CL 104 04/16/2018 0943   CO2 22 04/16/2018 0943   GLUCOSE 100 (H) 04/16/2018 0943   GLUCOSE 95 12/20/2017 1728   BUN 13 04/16/2018 0943   CREATININE 1.20 04/16/2018 0943   CALCIUM 9.2 04/16/2018 0943   GFRNONAA 64 04/16/2018 0943   GFRAA 73 04/16/2018 0943   Lab Results  Component Value Date   HGBA1C 5.9 (H) 04/16/2018   HGBA1C 5.8 (H) 12/02/2015   HGBA1C 5.4 03/14/2012   Lab Results  Component Value Date   INSULIN 15.2 04/16/2018   CBC    Component Value Date/Time   WBC 9.3 04/16/2018 0943   WBC 8.9 12/20/2017 1728   RBC 4.78 04/16/2018 0943   RBC 4.68 12/20/2017 1728   HGB 14.0 04/16/2018 0943   HCT 42.5 04/16/2018 0943   PLT 251 12/20/2017 1728   MCV 89  04/16/2018 0943   MCH 29.3 04/16/2018 0943   MCH 29.9 12/20/2017 1728   MCHC 32.9 04/16/2018 0943   MCHC 33.6 12/20/2017 1728   RDW 14.4 04/16/2018 0943   LYMPHSABS 1.8 04/16/2018 0943   MONOABS 0.9 12/01/2015 1915  EOSABS 0.2 04/16/2018 0943   BASOSABS 0.1 04/16/2018 0943   Iron/TIBC/Ferritin/ %Sat No results found for: IRON, TIBC, FERRITIN, IRONPCTSAT Lipid Panel     Component Value Date/Time   CHOL 154 04/16/2018 0943   TRIG 147 04/16/2018 0943   HDL 37 (L) 04/16/2018 0943   CHOLHDL 3.8 12/02/2015 0212   VLDL 13 12/02/2015 0212   LDLCALC 88 04/16/2018 0943   Hepatic Function Panel     Component Value Date/Time   PROT 6.8 04/16/2018 0943   ALBUMIN 4.5 04/16/2018 0943   AST 14 04/16/2018 0943   ALT 12 04/16/2018 0943   ALKPHOS 145 (H) 04/16/2018 0943   BILITOT 0.7 04/16/2018 0943     Ref. Range 04/16/2018 09:43  Vitamin D, 25-Hydroxy Latest Ref Range: 30.0 - 100.0 ng/mL 24.1 (L)    ASSESSMENT AND PLAN: Prediabetes - Plan: metFORMIN (GLUCOPHAGE) 500 MG tablet  Vitamin D deficiency - Plan: Vitamin D, Ergocalciferol, (DRISDOL) 50000 units CAPS capsule  At risk for osteoporosis  Class 2 severe obesity with serious comorbidity and body mass index (BMI) of 38.0 to 38.9 in adult, unspecified obesity type (HCC)  PLAN: Pre-Diabetes Delbert will continue to work on weight loss, exercise, and decreasing simple carbohydrates in his diet to help decrease the risk of diabetes. We dicussed metformin including benefits and risks. He was informed that eating too many simple carbohydrates or too many calories at one sitting increases the likelihood of GI side effects. Charley agreed to continue Metformin 500 mg daily #30 with no refills. Karl agreed to follow up with [place] as directed to monitor his progress.  Vitamin D Deficiency Tavyn was informed that low vitamin D levels contributes to fatigue and are associated with obesity, breast, and colon cancer. He agrees to continue to  take prescription Vit D @50 ,000 IU every week #4 with no refills and will follow up for routine testing of vitamin D, at least 2-3 times per year. He was informed of the risk of over-replacement of vitamin D and agrees to not increase his dose unless he discusses this with [place] first.  At risk for osteopenia and osteoporosis Teague was given extended  (15 minutes) osteoporosis prevention counseling today. Inman is at risk for osteopenia and osteoporosis due to his vitamin D deficiency. He was encouraged to take his vitamin D and follow his higher calcium diet and increase strengthening exercise to help strengthen his bones and decrease his risk of osteopenia and osteoporosis.  Obesity Flem is currently in the action stage of change. As such, his goal is to continue with weight loss efforts He has agreed to follow the Category 3 plan Byrl has been instructed to work up to a goal of 150 minutes of combined cardio and strengthening exercise per week for weight loss and overall health benefits. We discussed the following Behavioral Modification Strategies today: increasing lean protein intake, decreasing simple carbohydrates , work on meal planning and easy cooking plans and emotional eating strategies   Mayur has agreed to follow up with our clinic in 2-3 weeks. He was informed of the importance of frequent follow up visits to maximize his success with intensive lifestyle modifications for his multiple health conditions.   OBESITY BEHAVIORAL INTERVENTION VISIT  Today's visit was # 5   Starting weight: 250 lb Starting date: 04/16/18 Today's weight : 237 lb Today's date: 06/30/18 Total lbs lost to date: 13 lb    ASK: We discussed the diagnosis of obesity with [name] today and [name] agreed  to give [place] permission to discuss obesity behavioral modification therapy today.  ASSESS: Cordera has the diagnosis of obesity and his BMI today is 38.27 Damarrion is in the action  stage of change   ADVISE: Sean Winters was educated on the multiple health risks of obesity as well as the benefit of weight loss to improve his health. He was advised of the need for long term treatment and the importance of lifestyle modifications to improve his current health and to decrease his risk of future health problems.  AGREE: Multiple dietary modification options and treatment options were discussed and  Buzz agreed to follow the recommendations documented in the above note.  ARRANGE: Alakai was educated on the importance of frequent visits to treat obesity as outlined per CMS and USPSTF guidelines and agreed to schedule his next follow up appointment today.  I, Jeralene Peters, am acting as transcriptionist for Quillian Quince, MD   I have reviewed the above documentation for accuracy and completeness, and I agree with the above. -Quillian Quince, MD

## 2018-07-03 ENCOUNTER — Other Ambulatory Visit (INDEPENDENT_AMBULATORY_CARE_PROVIDER_SITE_OTHER): Payer: Self-pay | Admitting: Family Medicine

## 2018-07-03 DIAGNOSIS — E559 Vitamin D deficiency, unspecified: Secondary | ICD-10-CM

## 2018-07-05 ENCOUNTER — Other Ambulatory Visit (INDEPENDENT_AMBULATORY_CARE_PROVIDER_SITE_OTHER): Payer: Self-pay | Admitting: Family Medicine

## 2018-07-05 DIAGNOSIS — R7303 Prediabetes: Secondary | ICD-10-CM

## 2018-07-10 LAB — CUP PACEART REMOTE DEVICE CHECK
Battery Remaining Longevity: 93 mo
Battery Voltage: 3.02 V
Brady Statistic AP VP Percent: 1.38 %
Brady Statistic AP VS Percent: 0.6 %
Brady Statistic AS VP Percent: 0.27 %
Brady Statistic AS VS Percent: 97.75 %
Brady Statistic RA Percent Paced: 1.98 %
Brady Statistic RV Percent Paced: 1.67 %
Date Time Interrogation Session: 20191015012518
Implantable Lead Implant Date: 20170403
Implantable Lead Implant Date: 20170403
Implantable Lead Location: 753859
Implantable Lead Location: 753860
Implantable Lead Model: 5076
Implantable Lead Model: 5076
Implantable Pulse Generator Implant Date: 20170403
Lead Channel Impedance Value: 399 Ohm
Lead Channel Impedance Value: 437 Ohm
Lead Channel Impedance Value: 494 Ohm
Lead Channel Impedance Value: 532 Ohm
Lead Channel Pacing Threshold Amplitude: 0.5 V
Lead Channel Pacing Threshold Amplitude: 1.375 V
Lead Channel Pacing Threshold Pulse Width: 0.4 ms
Lead Channel Pacing Threshold Pulse Width: 0.4 ms
Lead Channel Sensing Intrinsic Amplitude: 14.125 mV
Lead Channel Sensing Intrinsic Amplitude: 14.125 mV
Lead Channel Sensing Intrinsic Amplitude: 4.625 mV
Lead Channel Sensing Intrinsic Amplitude: 4.625 mV
Lead Channel Setting Pacing Amplitude: 2 V
Lead Channel Setting Pacing Amplitude: 2.75 V
Lead Channel Setting Pacing Pulse Width: 0.4 ms
Lead Channel Setting Sensing Sensitivity: 2 mV

## 2018-07-21 ENCOUNTER — Ambulatory Visit (INDEPENDENT_AMBULATORY_CARE_PROVIDER_SITE_OTHER): Payer: BLUE CROSS/BLUE SHIELD | Admitting: Family Medicine

## 2018-07-21 VITALS — BP 128/73 | HR 107 | Temp 97.5°F | Ht 66.0 in | Wt 237.0 lb

## 2018-07-21 DIAGNOSIS — Z6838 Body mass index (BMI) 38.0-38.9, adult: Secondary | ICD-10-CM | POA: Diagnosis not present

## 2018-07-21 DIAGNOSIS — R7303 Prediabetes: Secondary | ICD-10-CM | POA: Diagnosis not present

## 2018-07-22 NOTE — Progress Notes (Signed)
Office: [phone]  /  Fax: [phone]   HPI:   Chief Complaint: OBESITY Sean Winters is here to discuss his progress with his obesity treatment plan. He is on the Category 3 plan and is following his eating plan approximately 75 % of the time. He states he is walking the dog 25 minutes 7 times per week. [name] has done well maintaining his weight loss, but he is worried his weight loss has stalled. [name] has questions about holiday eating strategies and he is getting bored with his Category 3 plan. His weight is 237 lb (107.5 kg) today and he has maintained weight over a period of 3 weeks since his last visit. He has lost 13 lbs since starting treatment with [place].  Pre-Diabetes [name] has a diagnosis of prediabetes based on his elevated Hgb A1c and was informed this puts him at greater risk of developing diabetes. He is stable on metformin and he continues to work on diet and exercise to decrease risk of diabetes. He denies nausea, vomiting or hypoglycemia.  ALLERGIES: Allergies  Allergen Reactions  . Haloperidol Other (See Comments)    Fatigue  . Ibuprofen Other (See Comments)    Cannot take while on lithium  . Sulfa Antibiotics Rash  . Sulfamethoxazole Rash    MEDICATIONS: Current Outpatient Medications on File Prior to Visit  Medication Sig Dispense Refill  . ASHWAGANDHA PO Take 250 mg by mouth once.    [name] aspirin EC 81 MG tablet Take 81 mg by mouth daily.    [name] atorvastatin (LIPITOR) 20 MG tablet Take 20 mg by mouth Daily.     . furosemide (LASIX) 40 MG tablet Take 2 tablets (80 mg total) by mouth daily. 180 tablet 3  . lamoTRIgine (LAMICTAL) 25 MG tablet Take 75 mg by mouth daily.  4  . lithium carbonate (LITHOBID) 300 MG CR tablet Take 900 mg by mouth at bedtime.  4  . metFORMIN (GLUCOPHAGE) 500 MG tablet Take 1 tablet (500 mg total) by mouth daily with breakfast. 30 tablet 0  . OVER THE COUNTER MEDICATION Take 3 capsules by mouth daily. CocoaVia- for memory and blood flow     . OVER THE COUNTER MEDICATION Take 2 tablets by mouth daily. For memory - Sensoril 250 mg tablets    . oxybutynin (DITROPAN-XL) 10 MG 24 hr tablet Take 10 mg by mouth daily.    . potassium chloride SA (K-DUR,KLOR-CON) 20 MEQ tablet Take 2 tablets (40 mEq total) by mouth daily. 180 tablet 3  . pramipexole (MIRAPEX) 1.5 MG tablet Take 1.5 mg by mouth at bedtime.     . Vitamin D, Ergocalciferol, (DRISDOL) 50000 units CAPS capsule Take 1 capsule (50,000 Units total) by mouth every 7 (seven) days. 4 capsule 0  . zolpidem (AMBIEN) 10 MG tablet Take 10 mg by mouth at bedtime.      No current facility-administered medications on file prior to visit.     PAST MEDICAL HISTORY: Past Medical History:  Diagnosis Date  . Anxiety   . Bipolar disorder (HCC)   . BPH (benign prostatic hypertrophy)   . Chest pain   . Complete heart block (HCC)    a. s/p Medtronic PPM 2017  . Cystitis, acute   . Depression   . Diverticulitis   . Dyslipidemia   . Dyspnea   . Edema   . Lumbago   . Nephrolithiasis   . Pulmonary embolism (HCC) 10/2009    PAST SURGICAL HISTORY: Past Surgical History:  Procedure Laterality  Date  . EP IMPLANTABLE DEVICE N/A 12/04/2015   Procedure: Pacemaker Implant;  Surgeon: Marinus Maw, MD;  Location: [facility] INVASIVE CV LAB;  Service: Cardiovascular;  Laterality: N/A;  . FINGER FRACTURE SURGERY  2004  . KIDNEY STONES     REMOVAL   . KNEE ARTHROSCOPY     RIGHT KNEE  . TONSILLECTOMY      SOCIAL HISTORY: Social History   Tobacco Use  . Smoking status: Former Smoker    Packs/day: 1.00    Years: 18.00    Pack years: 18.00    Types: Cigarettes    Last attempt to quit: 09/03/1987    Years since quitting: 30.9  . Smokeless tobacco: Never Used  Substance Use Topics  . Alcohol use: No  . Drug use: No    FAMILY HISTORY: Family History  Problem Relation Age of Onset  . Heart attack Father 54  . Hyperlipidemia Father   . Diabetes Father   . Hypertension Father   . Heart  disease Father   . Fibromyalgia Sister   . Stroke Mother   . Arrhythmia Mother        atrial fib.  . Depression Mother   . Anxiety disorder Mother   . Bipolar disorder Mother     ROS: Review of Systems  Constitutional: Negative for weight loss.  Gastrointestinal: Negative for nausea and vomiting.  Endo/Heme/Allergies:       Negative for hypoglycemia    PHYSICAL EXAM: Blood pressure 128/73, pulse (!) 107, temperature (!) 97.5 F (36.4 C), temperature source Oral, height 5\' 6"  (1.676 m), weight 237 lb (107.5 kg), SpO2 95 %. Body mass index is 38.25 kg/m. Physical Exam  Constitutional: He is oriented to person, place, and time. He appears well-developed and well-nourished.  Cardiovascular: Normal rate.  Pulmonary/Chest: Effort normal.  Musculoskeletal: Normal range of motion.  Neurological: He is oriented to person, place, and time.  Skin: Skin is warm and dry.  Psychiatric: He has a normal mood and affect. His behavior is normal.  Vitals reviewed.   RECENT LABS AND TESTS: BMET    Component Value Date/Time   NA 141 04/16/2018 0943   K 3.9 04/16/2018 0943   CL 104 04/16/2018 0943   CO2 22 04/16/2018 0943   GLUCOSE 100 (H) 04/16/2018 0943   GLUCOSE 95 12/20/2017 1728   BUN 13 04/16/2018 0943   CREATININE 1.20 04/16/2018 0943   CALCIUM 9.2 04/16/2018 0943   GFRNONAA 64 04/16/2018 0943   GFRAA 73 04/16/2018 0943   Lab Results  Component Value Date   HGBA1C 5.9 (H) 04/16/2018   HGBA1C 5.8 (H) 12/02/2015   HGBA1C 5.4 03/14/2012   Lab Results  Component Value Date   INSULIN 15.2 04/16/2018   CBC    Component Value Date/Time   WBC 9.3 04/16/2018 0943   WBC 8.9 12/20/2017 1728   RBC 4.78 04/16/2018 0943   RBC 4.68 12/20/2017 1728   HGB 14.0 04/16/2018 0943   HCT 42.5 04/16/2018 0943   PLT 251 12/20/2017 1728   MCV 89 04/16/2018 0943   MCH 29.3 04/16/2018 0943   MCH 29.9 12/20/2017 1728   MCHC 32.9 04/16/2018 0943   MCHC 33.6 12/20/2017 1728   RDW 14.4  04/16/2018 0943   LYMPHSABS 1.8 04/16/2018 0943   MONOABS 0.9 12/01/2015 1915   EOSABS 0.2 04/16/2018 0943   BASOSABS 0.1 04/16/2018 0943   Iron/TIBC/Ferritin/ %Sat No results found for: IRON, TIBC, FERRITIN, IRONPCTSAT Lipid Panel     Component  Value Date/Time   CHOL 154 04/16/2018 0943   TRIG 147 04/16/2018 0943   HDL 37 (L) 04/16/2018 0943   CHOLHDL 3.8 12/02/2015 0212   VLDL 13 12/02/2015 0212   LDLCALC 88 04/16/2018 0943   Hepatic Function Panel     Component Value Date/Time   PROT 6.8 04/16/2018 0943   ALBUMIN 4.5 04/16/2018 0943   AST 14 04/16/2018 0943   ALT 12 04/16/2018 0943   ALKPHOS 145 (H) 04/16/2018 0943   BILITOT 0.7 04/16/2018 0943    Results for THAYER, EMBLETON (MRN [id number]) as of 07/22/2018 09:29  Ref. Range 04/16/2018 09:43  Vitamin D, 25-Hydroxy Latest Ref Range: 30.0 - 100.0 ng/mL 24.1 (L)   ASSESSMENT AND PLAN: Prediabetes  Class 2 severe obesity with serious comorbidity and body mass index (BMI) of 38.0 to 38.9 in adult, unspecified obesity type (HCC)  PLAN:  Pre-Diabetes Shamus will continue to work on weight loss, exercise, and decreasing simple carbohydrates in his diet to help decrease the risk of diabetes. We dicussed metformin including benefits and risks. He was informed that eating too many simple carbohydrates or too many calories at one sitting increases the likelihood of GI side effects. Avel will continue metformin for now and a prescription was not written today. We will recheck labs in 1 month and Raequan agreed to follow up with [place] as directed to monitor his progress.  I spent > than 50% of the 15 minute visit on counseling as documented in the note.  Obesity Almalik is currently in the action stage of change. As such, his goal is to continue with weight loss efforts He has agreed to change to a lower carbohydrate, vegetable and lean protein rich diet plan until Thanksgiving Dontavius has been instructed to work up to a goal  of 150 minutes of combined cardio and strengthening exercise per week for weight loss and overall health benefits. We discussed the following Behavioral Modification Strategies today: increasing lean protein intake, decreasing simple carbohydrates  and work on meal planning and easy cooking plans  Rashaan has agreed to follow up with our clinic in 2 to 3 weeks. He was informed of the importance of frequent follow up visits to maximize his success with intensive lifestyle modifications for his multiple health conditions.   OBESITY BEHAVIORAL INTERVENTION VISIT  Today's visit was # 6   Starting weight: 250 lbs Starting date: 04/16/18 Today's weight : 237 lbs Today's date: 07/21/2018 Total lbs lost to date: 13   ASK: We discussed the diagnosis of obesity with [name] today and [name] agreed to give [place] permission to discuss obesity behavioral modification therapy today.  ASSESS: Eulice has the diagnosis of obesity and his BMI today is 38.27 Maaz is in the action stage of change   ADVISE: Kennie was educated on the multiple health risks of obesity as well as the benefit of weight loss to improve his health. He was advised of the need for long term treatment and the importance of lifestyle modifications to improve his current health and to decrease his risk of future health problems.  AGREE: Multiple dietary modification options and treatment options were discussed and  Copelan agreed to follow the recommendations documented in the above note.  ARRANGE: Irwin was educated on the importance of frequent visits to treat obesity as outlined per CMS and USPSTF guidelines and agreed to schedule his next follow up appointment today.  Cristi Loron, am acting as transcriptionist for Quillian Quince, MD  I  have reviewed the above documentation for accuracy and completeness, and I agree with the above. -Quillian Quincearen Trey Gulbranson, MD

## 2018-08-02 ENCOUNTER — Other Ambulatory Visit (INDEPENDENT_AMBULATORY_CARE_PROVIDER_SITE_OTHER): Payer: Self-pay | Admitting: Family Medicine

## 2018-08-02 DIAGNOSIS — E559 Vitamin D deficiency, unspecified: Secondary | ICD-10-CM

## 2018-08-03 ENCOUNTER — Other Ambulatory Visit: Payer: Self-pay | Admitting: Physician Assistant

## 2018-08-08 ENCOUNTER — Other Ambulatory Visit (INDEPENDENT_AMBULATORY_CARE_PROVIDER_SITE_OTHER): Payer: Self-pay | Admitting: Family Medicine

## 2018-08-08 DIAGNOSIS — R7303 Prediabetes: Secondary | ICD-10-CM

## 2018-08-12 ENCOUNTER — Encounter (INDEPENDENT_AMBULATORY_CARE_PROVIDER_SITE_OTHER): Payer: Self-pay | Admitting: Family Medicine

## 2018-08-12 ENCOUNTER — Ambulatory Visit (INDEPENDENT_AMBULATORY_CARE_PROVIDER_SITE_OTHER): Payer: BLUE CROSS/BLUE SHIELD | Admitting: Family Medicine

## 2018-08-12 VITALS — BP 112/74 | HR 91 | Ht 66.0 in | Wt 234.0 lb

## 2018-08-12 DIAGNOSIS — R7303 Prediabetes: Secondary | ICD-10-CM | POA: Diagnosis not present

## 2018-08-12 DIAGNOSIS — Z9189 Other specified personal risk factors, not elsewhere classified: Secondary | ICD-10-CM | POA: Diagnosis not present

## 2018-08-12 DIAGNOSIS — E559 Vitamin D deficiency, unspecified: Secondary | ICD-10-CM

## 2018-08-12 DIAGNOSIS — Z6837 Body mass index (BMI) 37.0-37.9, adult: Secondary | ICD-10-CM

## 2018-08-12 MED ORDER — METFORMIN HCL 500 MG PO TABS: 500.0000 mg | ORAL_TABLET | Freq: Two times a day (BID) | ORAL | 0 refills | Status: DC

## 2018-08-12 MED ORDER — VITAMIN D (ERGOCALCIFEROL) 1.25 MG (50000 UNIT) PO CAPS: 50000.0000 [IU] | ORAL_CAPSULE | ORAL | 0 refills | Status: DC

## 2018-08-13 NOTE — Progress Notes (Signed)
Office: [phone]  /  Fax: [phone]   HPI:   Chief Complaint: OBESITY [name] is here to discuss his progress with his obesity treatment plan. He is on the Category 3 plan and is following his eating plan approximately 70 % of the time. He states he is walking the dog 30 minutes 7 times per week. [name] continues to do very well with weight loss on his plan. He notes increased hunger when he deviates, especially if he eats fast food with french fries, which doesn't happen often.  His weight is 234 lb (106.1 kg) today and has had a weight loss of 3 pounds over a period of 3 weeks since his last visit. He has lost 16 lbs since starting treatment wit.  Pre-Diabetes [name] has a diagnosis of pre-diabetes based on his elevated Hgb A1c and was informed this puts him at greater risk of developing diabetes. He is taking metformin currently and continues to work on diet and exercise to decrease risk of diabetes. He notes increased evening polyphagia, especially worse after increased simple carbs. He denies nausea or hypoglycemia.  At risk for diabetes [name] is at higher than average risk for developing diabetes due to his pre-diabetes and obesity. He currently denies polyuria or polydipsia.  Vitamin D deficiency [name] has a diagnosis of vitamin D deficiency. He is currently taking vit D and is stable, but not yet at goal. He denies nausea, vomiting, or muscle weakness.  ALLERGIES: Allergies  Allergen Reactions  . Haloperidol Other (See Comments)    Fatigue  . Ibuprofen Other (See Comments)    Cannot take while on lithium  . Sulfa Antibiotics Rash  . Sulfamethoxazole Rash    MEDICATIONS: Current Outpatient Medications on File Prior to Visit  Medication Sig Dispense Refill  . ASHWAGANDHA PO Take 250 mg by mouth once.    [name]. aspirin EC 81 MG tablet Take 81 mg by mouth daily.    [name]. atorvastatin (LIPITOR) 20 MG tablet Take 20 mg by mouth Daily.     . furosemide (LASIX) 40 MG  tablet Take 2 tablets (80 mg total) by mouth daily. 60 tablet 6  . lamoTRIgine (LAMICTAL) 25 MG tablet Take 75 mg by mouth daily.  4  . lithium carbonate (LITHOBID) 300 MG CR tablet Take 900 mg by mouth at bedtime.  4  . OVER THE COUNTER MEDICATION Take 3 capsules by mouth daily. CocoaVia- for memory and blood flow    . OVER THE COUNTER MEDICATION Take 2 tablets by mouth daily. For memory - Sensoril 250 mg tablets    . oxybutynin (DITROPAN-XL) 10 MG 24 hr tablet Take 10 mg by mouth daily.    . pramipexole (MIRAPEX) 1.5 MG tablet Take 1.5 mg by mouth at bedtime.     [name]. zolpidem (AMBIEN) 10 MG tablet Take 10 mg by mouth at bedtime.     . furosemide (LASIX) 40 MG tablet Take 2 tablets (80 mg total) by mouth daily. 180 tablet 3  . potassium chloride SA (K-DUR,KLOR-CON) 20 MEQ tablet Take 2 tablets (40 mEq total) by mouth daily. 180 tablet 3   No current facility-administered medications on file prior to visit.     PAST MEDICAL HISTORY: Past Medical History:  Diagnosis Date  . Anxiety   . Bipolar disorder (HCC)   . BPH (benign prostatic hypertrophy)   . Chest pain   . Complete heart block (HCC)    a. s/p Medtronic PPM 2017  . Cystitis, acute   .  Depression   . Diverticulitis   . Dyslipidemia   . Dyspnea   . Edema   . Lumbago   . Nephrolithiasis   . Pulmonary embolism (HCC) 10/2009    PAST SURGICAL HISTORY: Past Surgical History:  Procedure Laterality Date  . EP IMPLANTABLE DEVICE N/A 12/04/2015   Procedure: Pacemaker Implant;  Surgeon: Marinus Maw, MD;  Location: [facility] INVASIVE CV LAB;  Service: Cardiovascular;  Laterality: N/A;  . FINGER FRACTURE SURGERY  2004  . KIDNEY STONES     REMOVAL   . KNEE ARTHROSCOPY     RIGHT KNEE  . TONSILLECTOMY      SOCIAL HISTORY: Social History   Tobacco Use  . Smoking status: Former Smoker    Packs/day: 1.00    Years: 18.00    Pack years: 18.00    Types: Cigarettes    Last attempt to quit: 09/03/1987    Years since quitting: 30.9  .  Smokeless tobacco: Never Used  Substance Use Topics  . Alcohol use: No  . Drug use: No    FAMILY HISTORY: Family History  Problem Relation Age of Onset  . Heart attack Father 51  . Hyperlipidemia Father   . Diabetes Father   . Hypertension Father   . Heart disease Father   . Fibromyalgia Sister   . Stroke Mother   . Arrhythmia Mother        atrial fib.  . Depression Mother   . Anxiety disorder Mother   . Bipolar disorder Mother     ROS: Review of Systems  Constitutional: Positive for weight loss.  Gastrointestinal: Negative for nausea and vomiting.  Genitourinary:       Negative for polyuria.  Musculoskeletal:       Negative for muscle weakness.  Endo/Heme/Allergies: Negative for polydipsia.       Positive for polyphagia.    PHYSICAL EXAM: Blood pressure 112/74, pulse 91, height 5\' 6"  (1.676 m), weight 234 lb (106.1 kg), SpO2 96 %. Body mass index is 37.77 kg/m. Physical Exam Vitals signs reviewed.  Constitutional:      Appearance: Normal appearance. He is obese.  Cardiovascular:     Rate and Rhythm: Normal rate.  Pulmonary:     Effort: Pulmonary effort is normal.  Musculoskeletal: Normal range of motion.  Skin:    General: Skin is warm and dry.  Neurological:     Mental Status: He is alert and oriented to person, place, and time.  Psychiatric:        Mood and Affect: Mood normal.        Behavior: Behavior normal.     RECENT LABS AND TESTS: BMET    Component Value Date/Time   NA 141 04/16/2018 0943   K 3.9 04/16/2018 0943   CL 104 04/16/2018 0943   CO2 22 04/16/2018 0943   GLUCOSE 100 (H) 04/16/2018 0943   GLUCOSE 95 12/20/2017 1728   BUN 13 04/16/2018 0943   CREATININE 1.20 04/16/2018 0943   CALCIUM 9.2 04/16/2018 0943   GFRNONAA 64 04/16/2018 0943   GFRAA 73 04/16/2018 0943   Lab Results  Component Value Date   HGBA1C 5.9 (H) 04/16/2018   HGBA1C 5.8 (H) 12/02/2015   HGBA1C 5.4 03/14/2012   Lab Results  Component Value Date   INSULIN  15.2 04/16/2018   CBC    Component Value Date/Time   WBC 9.3 04/16/2018 0943   WBC 8.9 12/20/2017 1728   RBC 4.78 04/16/2018 0943   RBC 4.68 12/20/2017 1728  HGB 14.0 04/16/2018 0943   HCT 42.5 04/16/2018 0943   PLT 251 12/20/2017 1728   MCV 89 04/16/2018 0943   MCH 29.3 04/16/2018 0943   MCH 29.9 12/20/2017 1728   MCHC 32.9 04/16/2018 0943   MCHC 33.6 12/20/2017 1728   RDW 14.4 04/16/2018 0943   LYMPHSABS 1.8 04/16/2018 0943   MONOABS 0.9 12/01/2015 1915   EOSABS 0.2 04/16/2018 0943   BASOSABS 0.1 04/16/2018 0943   Iron/TIBC/Ferritin/ %Sat No results found for: IRON, TIBC, FERRITIN, IRONPCTSAT Lipid Panel     Component Value Date/Time   CHOL 154 04/16/2018 0943   TRIG 147 04/16/2018 0943   HDL 37 (L) 04/16/2018 0943   CHOLHDL 3.8 12/02/2015 0212   VLDL 13 12/02/2015 0212   LDLCALC 88 04/16/2018 0943   Hepatic Function Panel     Component Value Date/Time   PROT 6.8 04/16/2018 0943   ALBUMIN 4.5 04/16/2018 0943   AST 14 04/16/2018 0943   ALT 12 04/16/2018 0943   ALKPHOS 145 (H) 04/16/2018 0943   BILITOT 0.7 04/16/2018 0943    Results for KAENAN, JAKE (MRN [id number]) as of 08/13/2018 09:02  Ref. Range 04/16/2018 09:43  Vitamin D, 25-Hydroxy Latest Ref Range: 30.0 - 100.0 ng/mL 24.1 (L)   ASSESSMENT AND PLAN: Vitamin D deficiency - Plan: Vitamin D, Ergocalciferol, (DRISDOL) 1.25 MG (50000 UT) CAPS capsule  Prediabetes - Plan: metFORMIN (GLUCOPHAGE) 500 MG tablet  At risk for diabetes mellitus  Class 2 severe obesity with serious comorbidity and body mass index (BMI) of 37.0 to 37.9 in adult, unspecified obesity type (HCC)  PLAN:  Pre-Diabetes Finnlee will continue to work on weight loss, exercise, and decreasing simple carbohydrates in his diet to help decrease the risk of diabetes.  He was informed that eating too many simple carbohydrates or too many calories at one sitting increases the likelihood of GI side effects. [name] agreed to increase  metformin to 500mg  BID #60 with no refills and a prescription was written today. We will check labs at his next visit. Donta agreed to follow up with [place] as directed to monitor his progress in 3 to 4 weeks.  Diabetes risk counseling Jaivian was given extended (15 minutes) diabetes prevention counseling today. He is 62 y.o. male and has risk factors for diabetes including pre-diabetes and obesity. We discussed intensive lifestyle modifications today with an emphasis on weight loss as well as increasing exercise and decreasing simple carbohydrates in his diet.  Vitamin D Deficiency Mckinnon was informed that low vitamin D levels contributes to fatigue and are associated with obesity, breast, and colon cancer. He agrees to continue to take prescription Vit D @50 ,000 IU every week #4 with no refills and will follow up for routine testing of vitamin D, at least 2-3 times per year. He was informed of the risk of over-replacement of vitamin D and agrees to not increase his dose unless he discusses this with [place] first. Kamel agrees to follow up as directed.  Obesity Franki is currently in the action stage of change. As such, his goal is to continue with weight loss efforts. He has agreed to follow the Category 3 plan. Giomar has been instructed to work up to a goal of 150 minutes of combined cardio and strengthening exercise per week for weight loss and overall health benefits. We discussed the following Behavioral Modification Strategies today: increasing lean protein intake, decreasing simple carbohydrates, decrease eating out, work on meal planning and easy cooking plans, holiday eating strategies, and  travel eating strategies.  Arleigh has agreed to follow up with our clinic in 3 to 4 weeks for a fasting appointment. He was informed of the importance of frequent follow up visits to maximize his success with intensive lifestyle modifications for his multiple health conditions.   OBESITY BEHAVIORAL  INTERVENTION VISIT  Today's visit was # 7   Starting weight: 250 lbs Starting date: 04/06/18 Today's weight : Weight: 234 lb (106.1 kg)  Today's date: 08/12/2018 Total lbs lost to date: 16  ASK: We discussed the diagnosis of obesity with [name] today and [name] agreed to give [place] permission to discuss obesity behavioral modification therapy today.  ASSESS: Ayush has the diagnosis of obesity and his BMI today is 37.7. Tyge is in the action stage of change.   ADVISE: Ulrick was educated on the multiple health risks of obesity as well as the benefit of weight loss to improve his health. He was advised of the need for long term treatment and the importance of lifestyle modifications to improve his current health and to decrease his risk of future health problems.  AGREE: Multiple dietary modification options and treatment options were discussed and Yordan agreed to follow the recommendations documented in the above note.  ARRANGE: Duran was educated on the importance of frequent visits to treat obesity as outlined per CMS and USPSTF guidelines and agreed to schedule his next follow up appointment today.  I, Kirke Corin, am acting as transcriptionist for Wilder Glade, MD  I have reviewed the above documentation for accuracy and completeness, and I agree with the above. -Quillian Quince, MD

## 2018-09-07 ENCOUNTER — Other Ambulatory Visit (INDEPENDENT_AMBULATORY_CARE_PROVIDER_SITE_OTHER): Payer: Self-pay | Admitting: Family Medicine

## 2018-09-07 DIAGNOSIS — R7303 Prediabetes: Secondary | ICD-10-CM

## 2018-09-14 ENCOUNTER — Ambulatory Visit: Payer: BLUE CROSS/BLUE SHIELD

## 2018-09-15 ENCOUNTER — Ambulatory Visit (INDEPENDENT_AMBULATORY_CARE_PROVIDER_SITE_OTHER): Payer: BLUE CROSS/BLUE SHIELD | Admitting: Family Medicine

## 2018-09-15 ENCOUNTER — Encounter (INDEPENDENT_AMBULATORY_CARE_PROVIDER_SITE_OTHER): Payer: Self-pay | Admitting: Family Medicine

## 2018-09-15 VITALS — BP 108/69 | HR 79 | Temp 97.5°F | Ht 66.0 in | Wt 238.0 lb

## 2018-09-15 DIAGNOSIS — R7303 Prediabetes: Secondary | ICD-10-CM | POA: Diagnosis not present

## 2018-09-15 DIAGNOSIS — E7849 Other hyperlipidemia: Secondary | ICD-10-CM

## 2018-09-15 DIAGNOSIS — Z6838 Body mass index (BMI) 38.0-38.9, adult: Secondary | ICD-10-CM

## 2018-09-15 DIAGNOSIS — Z9189 Other specified personal risk factors, not elsewhere classified: Secondary | ICD-10-CM

## 2018-09-15 DIAGNOSIS — E559 Vitamin D deficiency, unspecified: Secondary | ICD-10-CM | POA: Diagnosis not present

## 2018-09-15 MED ORDER — VITAMIN D (ERGOCALCIFEROL) 1.25 MG (50000 UNIT) PO CAPS: 50000.0000 [IU] | ORAL_CAPSULE | ORAL | 0 refills | Status: DC

## 2018-09-15 MED ORDER — METFORMIN HCL 500 MG PO TABS: 500.0000 mg | ORAL_TABLET | Freq: Two times a day (BID) | ORAL | 0 refills | Status: DC

## 2018-09-15 NOTE — Progress Notes (Signed)
Office: [phone]  /  Fax: [phone]   HPI:   Chief Complaint: OBESITY Sean Winters is here to discuss his progress with his obesity treatment plan. He is on the Category 3 plan and is following his eating plan approximately 60 % of the time. He states he is walking 20 to 40 minutes 7 times per week. Sean Winters is retaining some water weight today, which is likely due to increased sodium over the holidays. He is ready to get back on track, but is getting bored with his plan, especially for dinner.  His weight is 238 lb (108 kg) today and has had a weight gain of 4 pounds over a period of 5 weeks since his last visit. He has lost 12 lbs since starting treatment with us.  Pre-Diabetes Sean Winters has a diagnosis of pre-diabetes based on his elevated Hgb A1c and was informed this puts him at greater risk of developing diabetes. He is taking metformin currently and is working on diet and exercises to decrease risk of diabetes. Sean Winters notes that he misses his 2nd dose of metformin about 50% of the time. He denies nausea, vomiting, or hypoglycemia.  At risk for diabetes Sean Winters is at higher than average risk for developing diabetes due to his pre-diabetes and obesity. He currently denies polyuria or polydipsia.  Vitamin D deficiency Sean Winters has a diagnosis of vitamin D deficiency. He is currently taking vit D and he is due for labs today. He denies nausea, vomiting, or muscle weakness.  Hyperlipidemia Sean Winters has hyperlipidemia and has been trying to improve his cholesterol levels with intensive lifestyle modification including a low saturated fat diet, exercise and weight loss. He is on Lipitor and is working on his diet. He denies any chest pain or myalgias.  ASSESSMENT AND PLAN:  Prediabetes - Plan: Comprehensive metabolic panel, Hemoglobin A1c, Insulin, random, metFORMIN (GLUCOPHAGE) 500 MG tablet  Vitamin D deficiency - Plan: VITAMIN D 25 Hydroxy (Vit-D Deficiency, Fractures), Vitamin D,  Ergocalciferol, (DRISDOL) 1.25 MG (50000 UT) CAPS capsule  Other hyperlipidemia - Plan: Lipid Panel With LDL/HDL Ratio  At risk for diabetes mellitus  Class 2 severe obesity with serious comorbidity and body mass index (BMI) of 38.0 to 38.9 in adult, unspecified obesity type (HCC)  PLAN:  Pre-Diabetes Sean Winters will continue to work on weight loss, exercise, and decreasing simple carbohydrates in his diet to help decrease the risk of diabetes. He was informed that eating too many simple carbohydrates or too many calories at one sitting increases the likelihood of GI side effects. Sean Winters agreed to continue taking metformin 500mg  BID #60 with no refills and a prescription was written today. We discussed the 2nd dose of metformin and it is ok to take at dinner if it is easier to remember. We will check labs today. Sean Winters agreed to follow up with us as directed to monitor his progress in 2 weeks.  Diabetes risk counseling Sean Winters was given extended (15 minutes) diabetes prevention counseling today. He is 63 y.o. male and has risk factors for diabetes including pre-diabetes and obesity. We discussed intensive lifestyle modifications today with an emphasis on weight loss as well as increasing exercise and decreasing simple carbohydrates in his diet.  Vitamin D Deficiency Sean Winters was informed that low vitamin D levels contributes to fatigue and are associated with obesity, breast, and colon cancer. He agrees to continue to take prescription Vit D @50 ,000 IU every week #4 with no refills and will follow up for routine testing of vitamin D, at least  2-3 times per year. He was informed of the risk of over-replacement of vitamin D and agrees to not increase his dose unless he discusses this with us first. We will order labs today. Sean Winters agrees to follow up as directed.  Hyperlipidemia Sean Winters was informed of the American Heart Association Guidelines emphasizing intensive lifestyle modifications as the  first line treatment for hyperlipidemia. We discussed many lifestyle modifications today in depth, and Sean Winters will continue to work on decreasing saturated fats such as fatty red meat, butter and many fried foods. He will also increase vegetables and lean protein in his diet and continue to work on exercise and weight loss efforts. We will order labs today. Sean Winters agrees to continue his Lipitor and diet and will follow up at the agreed upon time.  Obesity Sean Winters is currently in the action stage of change. As such, his goal is to continue with weight loss efforts. He has agreed to follow the Category 3 plan and keep a food journal of 450 to 600 calories and 45 grams of protein for supper. Sean Winters has been instructed to work up to a goal of 150 minutes of combined cardio and strengthening exercise per week for weight loss and overall health benefits. We discussed the following Behavioral Modification Strategies today: increasing lean protein intake, decreasing simple carbohydrates, keep a strict food journal, and work on meal planning and easy cooking plans.  Sean Winters has agreed to follow up with our clinic in 2 weeks. He was informed of the importance of frequent follow up visits to maximize his success with intensive lifestyle modifications for his multiple health conditions.  ALLERGIES: Allergies  Allergen Reactions  . Haloperidol Other (See Comments)    Fatigue  . Ibuprofen Other (See Comments)    Cannot take while on lithium  . Sulfa Antibiotics Rash  . Sulfamethoxazole Rash    MEDICATIONS: Current Outpatient Medications on File Prior to Visit  Medication Sig Dispense Refill  . ASHWAGANDHA PO Take 250 mg by mouth once.    [name]. aspirin EC 81 MG tablet Take 81 mg by mouth daily.    [name]. atorvastatin (LIPITOR) 20 MG tablet Take 20 mg by mouth Daily.     . furosemide (LASIX) 40 MG tablet Take 2 tablets (80 mg total) by mouth daily. 60 tablet 6  . lamoTRIgine (LAMICTAL) 25 MG tablet Take 75 mg  by mouth daily.  4  . lithium carbonate (LITHOBID) 300 MG CR tablet Take 900 mg by mouth at bedtime.  4  . OVER THE COUNTER MEDICATION Take 3 capsules by mouth daily. CocoaVia- for memory and blood flow    . OVER THE COUNTER MEDICATION Take 2 tablets by mouth daily. For memory - Sensoril 250 mg tablets    . oxybutynin (DITROPAN-XL) 10 MG 24 hr tablet Take 10 mg by mouth daily.    . pramipexole (MIRAPEX) 1.5 MG tablet Take 1.5 mg by mouth at bedtime.     [name]. zolpidem (AMBIEN) 10 MG tablet Take 10 mg by mouth at bedtime.     . potassium chloride SA (K-DUR,KLOR-CON) 20 MEQ tablet Take 2 tablets (40 mEq total) by mouth daily. 180 tablet 3   No current facility-administered medications on file prior to visit.     PAST MEDICAL HISTORY: Past Medical History:  Diagnosis Date  . Anxiety   . Bipolar disorder (HCC)   . BPH (benign prostatic hypertrophy)   . Chest pain   . Complete heart block (HCC)    a. s/p Medtronic  PPM 2017  . Cystitis, acute   . Depression   . Diverticulitis   . Dyslipidemia   . Dyspnea   . Edema   . Lumbago   . Nephrolithiasis   . Pulmonary embolism (HCC) 10/2009    PAST SURGICAL HISTORY: Past Surgical History:  Procedure Laterality Date  . EP IMPLANTABLE DEVICE N/A 12/04/2015   Procedure: Pacemaker Implant;  Surgeon: Marinus Maw, MD;  Location: [facility] INVASIVE CV LAB;  Service: Cardiovascular;  Laterality: N/A;  . FINGER FRACTURE SURGERY  2004  . KIDNEY STONES     REMOVAL   . KNEE ARTHROSCOPY     RIGHT KNEE  . TONSILLECTOMY      SOCIAL HISTORY: Social History   Tobacco Use  . Smoking status: Former Smoker    Packs/day: 1.00    Years: 18.00    Pack years: 18.00    Types: Cigarettes    Last attempt to quit: 09/03/1987    Years since quitting: 31.0  . Smokeless tobacco: Never Used  Substance Use Topics  . Alcohol use: No  . Drug use: No    FAMILY HISTORY: Family History  Problem Relation Age of Onset  . Heart attack Father 72  . Hyperlipidemia Father    . Diabetes Father   . Hypertension Father   . Heart disease Father   . Fibromyalgia Sister   . Stroke Mother   . Arrhythmia Mother        atrial fib.  . Depression Mother   . Anxiety disorder Mother   . Bipolar disorder Mother     ROS: Review of Systems  Constitutional: Negative for weight loss.  Cardiovascular: Negative for chest pain.  Gastrointestinal: Negative for nausea and vomiting.  Genitourinary:       Negative for polyuria.  Musculoskeletal: Negative for myalgias.       Negative for muscle weakness.  Endo/Heme/Allergies: Negative for polydipsia.       Negative for hypoglycemia.    PHYSICAL EXAM: Blood pressure 108/69, pulse 79, temperature (!) 97.5 F (36.4 C), temperature source Oral, height 5\' 6"  (1.676 m), weight 238 lb (108 kg), SpO2 94 %. Body mass index is 38.41 kg/m. Physical Exam Vitals signs reviewed.  Constitutional:      Appearance: Normal appearance. He is obese.  Cardiovascular:     Rate and Rhythm: Normal rate.  Pulmonary:     Effort: Pulmonary effort is normal.  Musculoskeletal: Normal range of motion.  Skin:    General: Skin is warm and dry.  Neurological:     Mental Status: He is alert and oriented to person, place, and time.  Psychiatric:        Mood and Affect: Mood normal.        Behavior: Behavior normal.     RECENT LABS AND TESTS: BMET    Component Value Date/Time   NA 141 04/16/2018 0943   K 3.9 04/16/2018 0943   CL 104 04/16/2018 0943   CO2 22 04/16/2018 0943   GLUCOSE 100 (H) 04/16/2018 0943   GLUCOSE 95 12/20/2017 1728   BUN 13 04/16/2018 0943   CREATININE 1.20 04/16/2018 0943   CALCIUM 9.2 04/16/2018 0943   GFRNONAA 64 04/16/2018 0943   GFRAA 73 04/16/2018 0943   Lab Results  Component Value Date   HGBA1C 5.9 (H) 04/16/2018   HGBA1C 5.8 (H) 12/02/2015   HGBA1C 5.4 03/14/2012   Lab Results  Component Value Date   INSULIN 15.2 04/16/2018   CBC    Component  Value Date/Time   WBC 9.3 04/16/2018 0943    WBC 8.9 12/20/2017 1728   RBC 4.78 04/16/2018 0943   RBC 4.68 12/20/2017 1728   HGB 14.0 04/16/2018 0943   HCT 42.5 04/16/2018 0943   PLT 251 12/20/2017 1728   MCV 89 04/16/2018 0943   MCH 29.3 04/16/2018 0943   MCH 29.9 12/20/2017 1728   MCHC 32.9 04/16/2018 0943   MCHC 33.6 12/20/2017 1728   RDW 14.4 04/16/2018 0943   LYMPHSABS 1.8 04/16/2018 0943   MONOABS 0.9 12/01/2015 1915   EOSABS 0.2 04/16/2018 0943   BASOSABS 0.1 04/16/2018 0943   Iron/TIBC/Ferritin/ %Sat No results found for: IRON, TIBC, FERRITIN, IRONPCTSAT Lipid Panel     Component Value Date/Time   CHOL 154 04/16/2018 0943   TRIG 147 04/16/2018 0943   HDL 37 (L) 04/16/2018 0943   CHOLHDL 3.8 12/02/2015 0212   VLDL 13 12/02/2015 0212   LDLCALC 88 04/16/2018 0943   Hepatic Function Panel     Component Value Date/Time   PROT 6.8 04/16/2018 0943   ALBUMIN 4.5 04/16/2018 0943   AST 14 04/16/2018 0943   ALT 12 04/16/2018 0943   ALKPHOS 145 (H) 04/16/2018 0943   BILITOT 0.7 04/16/2018 0943      Component Value Date/Time   TSH 1.170 04/16/2018 0943   TSH 4.063 12/02/2015 0212   TSH 1.188 Test methodology is 3rd generation TSH 10/23/2009 0530   Results for ARSH, FEUTZ (MRN [id number]) as of 09/15/2018 13:04  Ref. Range 04/16/2018 09:43  Vitamin D, 25-Hydroxy Latest Ref Range: 30.0 - 100.0 ng/mL 24.1 (L)    OBESITY BEHAVIORAL INTERVENTION VISIT  Today's visit was # 8   Starting weight: 250 lbs Starting date: 04/16/18 Today's weight : Weight: 238 lb (108 kg)  Today's date: 09/15/2018 Total lbs lost to date: 12  ASK: We discussed the diagnosis of obesity with Sean Winters today and Sean Winters agreed to give [place] permission to discuss obesity behavioral modification therapy today.  ASSESS: Sean Winters has the diagnosis of obesity and his BMI today is 38.4. Sean Winters is in the action stage of change.   ADVISE: Sean Winters was educated on the multiple health risks of obesity as well as the benefit of weight  loss to improve his health. He was advised of the need for long term treatment and the importance of lifestyle modifications to improve his current health and to decrease his risk of future health problems.  AGREE: Multiple dietary modification options and treatment options were discussed and Sean Winters agreed to follow the recommendations documented in the above note.  ARRANGE: Sean Winters was educated on the importance of frequent visits to treat obesity as outlined per CMS and USPSTF guidelines and agreed to schedule his next follow up appointment today.  I, Kirke Corin, am acting as transcriptionist for Wilder Glade, MD  I have reviewed the above documentation for accuracy and completeness, and I agree with the above. -Quillian Quince, MD

## 2018-09-16 ENCOUNTER — Telehealth: Payer: Self-pay

## 2018-09-16 LAB — COMPREHENSIVE METABOLIC PANEL
ALT: 13 IU/L (ref 0–44)
AST: 12 IU/L (ref 0–40)
Albumin/Globulin Ratio: 1.9 (ref 1.2–2.2)
Albumin: 4.3 g/dL (ref 3.6–4.8)
Alkaline Phosphatase: 131 IU/L — ABNORMAL HIGH (ref 39–117)
BUN/Creatinine Ratio: 18 (ref 10–24)
BUN: 21 mg/dL (ref 8–27)
Bilirubin Total: 0.6 mg/dL (ref 0.0–1.2)
CO2: 21 mmol/L (ref 20–29)
Calcium: 9.3 mg/dL (ref 8.6–10.2)
Chloride: 103 mmol/L (ref 96–106)
Creatinine, Ser: 1.15 mg/dL (ref 0.76–1.27)
GFR calc Af Amer: 77 mL/min/{1.73_m2} (ref >59)
GFR calc non Af Amer: 67 mL/min/{1.73_m2} (ref >59)
Globulin, Total: 2.3 g/dL (ref 1.5–4.5)
Glucose: 94 mg/dL (ref 65–99)
Potassium: 4.6 mmol/L (ref 3.5–5.2)
Sodium: 142 mmol/L (ref 134–144)
Total Protein: 6.6 g/dL (ref 6.0–8.5)

## 2018-09-16 LAB — LIPID PANEL WITH LDL/HDL RATIO
Cholesterol, Total: 155 mg/dL (ref 100–199)
HDL: 39 mg/dL — ABNORMAL LOW (ref >39)
LDL Calculated: 99 mg/dL (ref 0–99)
LDl/HDL Ratio: 2.5 ratio (ref 0.0–3.6)
Triglycerides: 86 mg/dL (ref 0–149)
VLDL Cholesterol Cal: 17 mg/dL (ref 5–40)

## 2018-09-16 LAB — VITAMIN D 25 HYDROXY (VIT D DEFICIENCY, FRACTURES): Vit D, 25-Hydroxy: 39.9 ng/mL (ref 30.0–100.0)

## 2018-09-16 LAB — HEMOGLOBIN A1C
Est. average glucose Bld gHb Est-mCnc: 123 mg/dL
Hgb A1c MFr Bld: 5.9 % — ABNORMAL HIGH (ref 4.8–5.6)

## 2018-09-16 LAB — INSULIN, RANDOM: INSULIN: 11.3 u[IU]/mL (ref 2.6–24.9)

## 2018-09-16 NOTE — Telephone Encounter (Signed)
Spoke with patient to remind of missed remote transmission 

## 2018-09-17 LAB — CUP PACEART REMOTE DEVICE CHECK
Battery Remaining Longevity: 92 mo
Battery Voltage: 3.02 V
Brady Statistic AP VP Percent: 1.62 %
Brady Statistic AP VS Percent: 0.72 %
Brady Statistic AS VP Percent: 0.22 %
Brady Statistic AS VS Percent: 97.44 %
Brady Statistic RA Percent Paced: 2.34 %
Brady Statistic RV Percent Paced: 1.86 %
Date Time Interrogation Session: 20200116004414
Implantable Lead Implant Date: 20170403
Implantable Lead Implant Date: 20170403
Implantable Lead Location: 753859
Implantable Lead Location: 753860
Implantable Lead Model: 5076
Implantable Lead Model: 5076
Implantable Pulse Generator Implant Date: 20170403
Lead Channel Impedance Value: 399 Ohm
Lead Channel Impedance Value: 456 Ohm
Lead Channel Impedance Value: 513 Ohm
Lead Channel Impedance Value: 532 Ohm
Lead Channel Pacing Threshold Amplitude: 0.625 V
Lead Channel Pacing Threshold Amplitude: 1.375 V
Lead Channel Pacing Threshold Pulse Width: 0.4 ms
Lead Channel Pacing Threshold Pulse Width: 0.4 ms
Lead Channel Sensing Intrinsic Amplitude: 15.125 mV
Lead Channel Sensing Intrinsic Amplitude: 15.125 mV
Lead Channel Sensing Intrinsic Amplitude: 4.5 mV
Lead Channel Sensing Intrinsic Amplitude: 4.5 mV
Lead Channel Setting Pacing Amplitude: 2 V
Lead Channel Setting Pacing Amplitude: 2.75 V
Lead Channel Setting Pacing Pulse Width: 0.4 ms
Lead Channel Setting Sensing Sensitivity: 2 mV

## 2018-09-29 ENCOUNTER — Ambulatory Visit (INDEPENDENT_AMBULATORY_CARE_PROVIDER_SITE_OTHER): Payer: BLUE CROSS/BLUE SHIELD | Admitting: Family Medicine

## 2018-09-29 ENCOUNTER — Encounter (INDEPENDENT_AMBULATORY_CARE_PROVIDER_SITE_OTHER): Payer: Self-pay | Admitting: Family Medicine

## 2018-09-29 VITALS — BP 118/75 | HR 81 | Temp 97.4°F | Ht 66.0 in | Wt 240.0 lb

## 2018-09-29 DIAGNOSIS — Z6838 Body mass index (BMI) 38.0-38.9, adult: Secondary | ICD-10-CM | POA: Diagnosis not present

## 2018-09-29 DIAGNOSIS — R600 Localized edema: Secondary | ICD-10-CM | POA: Diagnosis not present

## 2018-09-30 NOTE — Progress Notes (Signed)
Office: [phone]  /  Fax: [phone]   HPI:   Chief Complaint: OBESITY Sean Winters is here to discuss his progress with his obesity treatment plan. He is on the Category 3 plan keep a food journal of 450 to 600 calories and 45 grams of protein for supper and is following his eating plan approximately 70 % of the time. He states he is walking the dog 25 minutes 7 times per week. Sean Winters is retaining fluid today and has gained weight, but is also not doing as well with journaling his dinner.  His weight is 240 lb (108.9 kg) today and has had a weight gain of 2 pounds over a period of 2 weeks since his last visit. He has lost 10 lbs since starting treatment with [place].  Bilateral Lower Extremity Edema Sean Winters has Left greater than Right lower extremity edema, which has been worse in the last 2 weeks. It is worse with changing to a sedentary position.  ASSESSMENT AND PLAN:  Bilateral lower extremity edema  Class 2 severe obesity with serious comorbidity and body mass index (BMI) of 38.0 to 38.9 in adult, unspecified obesity type (HCC)  PLAN:  Bilateral Lower Extremity Edema Able agrees to increase activity and add walking 150 minutes 2 to 3 times a day and to wear compression hose. He agrees to follow up in 2 to 3 weeks.  I spent > than 50% of the 25 minute visit on counseling as documented in the note.  Obesity Sean Winters is currently in the action stage of change. As such, his goal is to continue with weight loss efforts. He has agreed to go back to follow the Category 3 plan strictly. Sean Winters has been instructed to work up to a goal of 150 minutes of combined cardio and strengthening exercise per week for weight loss and overall health benefits.  Sean Winters has agreed to follow up with our clinic in 2 to 3 weeks. He was informed of the importance of frequent follow up visits to maximize his success with intensive lifestyle modifications for his multiple health  conditions.  ALLERGIES: Allergies  Allergen Reactions  . Haloperidol Other (See Comments)    Fatigue  . Ibuprofen Other (See Comments)    Cannot take while on lithium  . Sulfa Antibiotics Rash  . Sulfamethoxazole Rash    MEDICATIONS: Current Outpatient Medications on File Prior to Visit  Medication Sig Dispense Refill  . ASHWAGANDHA PO Take 250 mg by mouth once.    [name] aspirin EC 81 MG tablet Take 81 mg by mouth daily.    [name] atorvastatin (LIPITOR) 20 MG tablet Take 20 mg by mouth Daily.     . furosemide (LASIX) 40 MG tablet Take 2 tablets (80 mg total) by mouth daily. 60 tablet 6  . lamoTRIgine (LAMICTAL) 25 MG tablet Take 75 mg by mouth daily.  4  . lithium carbonate (LITHOBID) 300 MG CR tablet Take 900 mg by mouth at bedtime.  4  . metFORMIN (GLUCOPHAGE) 500 MG tablet Take 1 tablet (500 mg total) by mouth 2 (two) times daily with a meal. 60 tablet 0  . OVER THE COUNTER MEDICATION Take 3 capsules by mouth daily. CocoaVia- for memory and blood flow    . OVER THE COUNTER MEDICATION Take 2 tablets by mouth daily. For memory - Sensoril 250 mg tablets    . oxybutynin (DITROPAN-XL) 10 MG 24 hr tablet Take 10 mg by mouth daily.    . pramipexole (MIRAPEX) 1.5 MG tablet Take 1.5  mg by mouth at bedtime.     . Vitamin D, Ergocalciferol, (DRISDOL) 1.25 MG (50000 UT) CAPS capsule Take 1 capsule (50,000 Units total) by mouth every 7 (seven) days. 4 capsule 0  . zolpidem (AMBIEN) 10 MG tablet Take 10 mg by mouth at bedtime.     . potassium chloride SA (K-DUR,KLOR-CON) 20 MEQ tablet Take 2 tablets (40 mEq total) by mouth daily. 180 tablet 3   No current facility-administered medications on file prior to visit.     PAST MEDICAL HISTORY: Past Medical History:  Diagnosis Date  . Anxiety   . Bipolar disorder (HCC)   . BPH (benign prostatic hypertrophy)   . Chest pain   . Complete heart block (HCC)    a. s/p Medtronic PPM 2017  . Cystitis, acute   . Depression   . Diverticulitis   .  Dyslipidemia   . Dyspnea   . Edema   . Lumbago   . Nephrolithiasis   . Pulmonary embolism (HCC) 10/2009    PAST SURGICAL HISTORY: Past Surgical History:  Procedure Laterality Date  . EP IMPLANTABLE DEVICE N/A 12/04/2015   Procedure: Pacemaker Implant;  Surgeon: Marinus MawGregg W Taylor, MD;  Location[facility] INVASIVE CV LAB;  Service: Cardiovascular;  Laterality: N/A;  . FINGER FRACTURE SURGERY  2004  . KIDNEY STONES     REMOVAL   . KNEE ARTHROSCOPY     RIGHT KNEE  . TONSILLECTOMY      SOCIAL HISTORY: Social History   Tobacco Use  . Smoking status: Former Smoker    Packs/day: 1.00    Years: 18.00    Pack years: 18.00    Types: Cigarettes    Last attempt to quit: 09/03/1987    Years since quitting: 31.0  . Smokeless tobacco: Never Used  Substance Use Topics  . Alcohol use: No  . Drug use: No    FAMILY HISTORY: Family History  Problem Relation Age of Onset  . Heart attack Father 6650  . Hyperlipidemia Father   . Diabetes Father   . Hypertension Father   . Heart disease Father   . Fibromyalgia Sister   . Stroke Mother   . Arrhythmia Mother        atrial fib.  . Depression Mother   . Anxiety disorder Mother   . Bipolar disorder Mother     ROS: Review of Systems  Constitutional: Negative for weight loss.    PHYSICAL EXAM: Blood pressure 118/75, pulse 81, temperature (!) 97.4 F (36.3 C), temperature source Oral, height 5\' 6"  (1.676 m), weight 240 lb (108.9 kg), SpO2 95 %. Body mass index is 38.74 kg/m. Physical Exam Vitals signs reviewed.  Constitutional:      Appearance: Normal appearance. He is obese.  Cardiovascular:     Rate and Rhythm: Normal rate.  Pulmonary:     Effort: Pulmonary effort is normal.  Musculoskeletal: Normal range of motion.     Right lower leg: Edema present.     Left lower leg: Edema present.  Skin:    General: Skin is warm and dry.  Neurological:     Mental Status: He is alert and oriented to person, place, and time.  Psychiatric:         Mood and Affect: Mood normal.        Behavior: Behavior normal.     RECENT LABS AND TESTS: BMET    Component Value Date/Time   NA 142 09/15/2018 0758   K 4.6 09/15/2018 0758   CL  103 09/15/2018 0758   CO2 21 09/15/2018 0758   GLUCOSE 94 09/15/2018 0758   GLUCOSE 95 12/20/2017 1728   BUN 21 09/15/2018 0758   CREATININE 1.15 09/15/2018 0758   CALCIUM 9.3 09/15/2018 0758   GFRNONAA 67 09/15/2018 0758   GFRAA 77 09/15/2018 0758   Lab Results  Component Value Date   HGBA1C 5.9 (H) 09/15/2018   HGBA1C 5.9 (H) 04/16/2018   HGBA1C 5.8 (H) 12/02/2015   HGBA1C 5.4 03/14/2012   Lab Results  Component Value Date   INSULIN 11.3 09/15/2018   INSULIN 15.2 04/16/2018   CBC    Component Value Date/Time   WBC 9.3 04/16/2018 0943   WBC 8.9 12/20/2017 1728   RBC 4.78 04/16/2018 0943   RBC 4.68 12/20/2017 1728   HGB 14.0 04/16/2018 0943   HCT 42.5 04/16/2018 0943   PLT 251 12/20/2017 1728   MCV 89 04/16/2018 0943   MCH 29.3 04/16/2018 0943   MCH 29.9 12/20/2017 1728   MCHC 32.9 04/16/2018 0943   MCHC 33.6 12/20/2017 1728   RDW 14.4 04/16/2018 0943   LYMPHSABS 1.8 04/16/2018 0943   MONOABS 0.9 12/01/2015 1915   EOSABS 0.2 04/16/2018 0943   BASOSABS 0.1 04/16/2018 0943   Iron/TIBC/Ferritin/ %Sat No results found for: IRON, TIBC, FERRITIN, IRONPCTSAT Lipid Panel     Component Value Date/Time   CHOL 155 09/15/2018 0758   TRIG 86 09/15/2018 0758   HDL 39 (L) 09/15/2018 0758   CHOLHDL 3.8 12/02/2015 0212   VLDL 13 12/02/2015 0212   LDLCALC 99 09/15/2018 0758   Hepatic Function Panel     Component Value Date/Time   PROT 6.6 09/15/2018 0758   ALBUMIN 4.3 09/15/2018 0758   AST 12 09/15/2018 0758   ALT 13 09/15/2018 0758   ALKPHOS 131 (H) 09/15/2018 0758   BILITOT 0.6 09/15/2018 0758      Component Value Date/Time   TSH 1.170 04/16/2018 0943   TSH 4.063 12/02/2015 0212   TSH 1.188 Test methodology is 3rd  10/23/2009 0530   Results for DAVIN, MCADOO (MRN  [id number]) as of 09/30/2018 12:26  Ref. Range 09/15/2018 07:58  Vitamin D, 25-Hydroxy Latest Ref Range: 30.0 - 100.0 ng/mL 39.9    OBESITY BEHAVIORAL INTERVENTION VISIT  Today's visit was # 9   Starting weight: 250 lbs Starting date: 04/16/18 Today's weight : Weight: 240 lb (108.9 kg)  Today's date: 09/29/2018 Total lbs lost to date: 10  ASK: We discussed the diagnosis of obesity with [name] today and [name] agreed to give [place] permission to discuss obesity behavioral modification therapy today.  ASSESS: Annette has the diagnosis of obesity and his BMI today is 38.7. Jewlian is in the action stage of change.   ADVISE: Neils was educated on the multiple health risks of obesity as well as the benefit of weight loss to improve his health. He was advised of the need for long term treatment and the importance of lifestyle modifications to improve his current health and to decrease his risk of future health problems.  AGREE: Multiple dietary modification options and treatment options were discussed and Durrel agreed to follow the recommendations documented in the above note.  ARRANGE: Quinnten was educated on the importance of frequent visits to treat obesity as outlined per CMS and USPSTF guidelines and agreed to schedule his next follow up appointment today.  Launa Flight, am acting as transcriptionist for Wilder Glade, MD  I have reviewed the above documentation for accuracy  and completeness, and I agree with the above. -Dennard Nip, MD

## 2018-10-14 ENCOUNTER — Other Ambulatory Visit (INDEPENDENT_AMBULATORY_CARE_PROVIDER_SITE_OTHER): Payer: Self-pay | Admitting: Family Medicine

## 2018-10-14 DIAGNOSIS — E559 Vitamin D deficiency, unspecified: Secondary | ICD-10-CM

## 2018-10-26 ENCOUNTER — Ambulatory Visit (INDEPENDENT_AMBULATORY_CARE_PROVIDER_SITE_OTHER): Payer: BLUE CROSS/BLUE SHIELD | Admitting: Family Medicine

## 2018-10-26 ENCOUNTER — Encounter (INDEPENDENT_AMBULATORY_CARE_PROVIDER_SITE_OTHER): Payer: Self-pay | Admitting: Family Medicine

## 2018-10-26 VITALS — BP 124/75 | HR 84 | Ht 66.0 in | Wt 242.0 lb

## 2018-10-26 DIAGNOSIS — R7303 Prediabetes: Secondary | ICD-10-CM | POA: Diagnosis not present

## 2018-10-26 DIAGNOSIS — E559 Vitamin D deficiency, unspecified: Secondary | ICD-10-CM | POA: Diagnosis not present

## 2018-10-26 DIAGNOSIS — Z6839 Body mass index (BMI) 39.0-39.9, adult: Secondary | ICD-10-CM

## 2018-10-26 DIAGNOSIS — Z9189 Other specified personal risk factors, not elsewhere classified: Secondary | ICD-10-CM

## 2018-10-26 NOTE — Progress Notes (Signed)
Office: [phone]  /  Fax: [phone]   HPI:   Chief Complaint: OBESITY Sean Winters is here to discuss his progress with his obesity treatment plan. He is on the Category 3 plan and is following his eating plan approximately 75 % of the time. He states he is walking the dog for 20-30 minutes 7 times per week. 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 He is deviating from the plan more as he snacks more at night while studying.  His weight is 242 lb (109.8 kg) today and has gained 2 pounds since his last visit. He has lost 8 lbs since starting treatment with [place].  Pre-Diabetes Sean Winters has a diagnosis of pre-diabetes based on his elevated Hgb A1c and was informed this puts him at greater risk of developing diabetes. He is stable on metformin and continues to work on diet and exercise to decrease risk of diabetes. He denies nausea, vomiting, or hypoglycemia.  At risk for diabetes Sean Winters is at higher than average risk for developing diabetes due to his obesity and pre-diabetes. He currently denies polyuria or polydipsia.  Vitamin D Deficiency Sean Winters has a diagnosis of vitamin D deficiency. He is stable on prescription Vit D and denies nausea, vomiting or muscle weakness.  ASSESSMENT AND PLAN:  Prediabetes - Plan: metFORMIN (GLUCOPHAGE) 500 MG tablet  Vitamin D deficiency - Plan: Vitamin D, Ergocalciferol, (DRISDOL) 1.25 MG (50000 UT) CAPS capsule  At risk for diabetes mellitus  Class 2 severe obesity with serious comorbidity and body mass index (BMI) of 39.0 to 39.9 in adult, unspecified obesity type (HCC)  PLAN:  Pre-Diabetes Sean Winters will continue to work on weight loss, diet, exercise, and decreasing simple carbohydrates in his diet to help decrease the risk of diabetes. We dicussed metformin including benefits and risks. He was informed that eating too many simple carbohydrates or too many calories at one sitting increases the likelihood of  GI side effects. Sean Winters agrees to continue taking metformin 500 mg BID #60 and we will refill for 1 month. [name] agrees to follow up with our clinic in 2 to 3 weeks as directed to monitor his progress.  Diabetes risk counseling Sean Winters was given extended (15 minutes) diabetes prevention counseling today. He is 63 y.o. male and has risk factors for diabetes including obesity and pre-diabetes. We discussed intensive lifestyle modifications today with an emphasis on weight loss as well as increasing exercise and decreasing simple carbohydrates in his diet.  Vitamin D Deficiency Sean Winters was informed that low vitamin D levels contributes to fatigue and are associated with obesity, breast, and colon cancer. Sean Winters agrees to continue taking prescription Vit D @50 ,000 IU every week #4 and we will refill for 1 month. He will follow up for routine testing of vitamin D, at least 2-3 times per year. He was informed of the risk of over-replacement of vitamin D and agrees to not increase his dose unless he discusses this with [place] first. Sean Winters agrees to follow up with our clinic in 2 to 3 weeks.  Obesity Sean Winters is currently in the action stage of change. As such, his goal is to continue with weight loss efforts He has agreed to change to follow a lower carbohydrate, vegetable and lean protein rich diet plan Sean Winters has been instructed to work up to a goal of 150 minutes of combined cardio and strengthening exercise per week for weight loss and overall health benefits. We discussed the following Behavioral Modification Strategies today: increasing  lean protein intake, decreasing simple carbohydrates  and work on meal planning and easy cooking plans   Sean Winters has agreed to follow up with our clinic in 2 to 3 weeks. He was informed of the importance of frequent follow up visits to maximize his success with intensive lifestyle modifications for his multiple health conditions.  ALLERGIES: Allergies  Allergen  Reactions  . Haloperidol Other (See Comments)    Fatigue  . Ibuprofen Other (See Comments)    Cannot take while on lithium  . Sulfa Antibiotics Rash  . Sulfamethoxazole Rash    MEDICATIONS: Current Outpatient Medications on File Prior to Visit  Medication Sig Dispense Refill  . ASHWAGANDHA PO Take 250 mg by mouth once.    [name] aspirin EC 81 MG tablet Take 81 mg by mouth daily.    [name] atorvastatin (LIPITOR) 20 MG tablet Take 20 mg by mouth Daily.     . furosemide (LASIX) 40 MG tablet Take 2 tablets (80 mg total) by mouth daily. 60 tablet 6  . lamoTRIgine (LAMICTAL) 25 MG tablet Take 75 mg by mouth daily.  4  . lithium carbonate (LITHOBID) 300 MG CR tablet Take 900 mg by mouth at bedtime.  4  . metFORMIN (GLUCOPHAGE) 500 MG tablet Take 1 tablet (500 mg total) by mouth 2 (two) times daily with a meal. 60 tablet 0  . OVER THE COUNTER MEDICATION Take 3 capsules by mouth daily. CocoaVia- for memory and blood flow    . OVER THE COUNTER MEDICATION Take 2 tablets by mouth daily. For memory - Sensoril 250 mg tablets    . oxybutynin (DITROPAN-XL) 10 MG 24 hr tablet Take 10 mg by mouth daily.    . pramipexole (MIRAPEX) 1.5 MG tablet Take 1.5 mg by mouth at bedtime.     . Vitamin D, Ergocalciferol, (DRISDOL) 1.25 MG (50000 UT) CAPS capsule Take 1 capsule (50,000 Units total) by mouth every 7 (seven) days. 4 capsule 0  . zolpidem (AMBIEN) 10 MG tablet Take 10 mg by mouth at bedtime.     . potassium chloride SA (K-DUR,KLOR-CON) 20 MEQ tablet Take 2 tablets (40 mEq total) by mouth daily. 180 tablet 3   No current facility-administered medications on file prior to visit.     PAST MEDICAL HISTORY: Past Medical History:  Diagnosis Date  . Anxiety   . Bipolar disorder (HCC)   . BPH (benign prostatic hypertrophy)   . Chest pain   . Complete heart block (HCC)    a. s/p Medtronic PPM 2017  . Cystitis, acute   . Depression   . Diverticulitis   . Dyslipidemia   . Dyspnea   . Edema   . Lumbago   .  Nephrolithiasis   . Pulmonary embolism (HCC) 10/2009    PAST SURGICAL HISTORY: Past Surgical History:  Procedure Laterality Date  . EP IMPLANTABLE DEVICE N/A 12/04/2015   Procedure: Pacemaker Implant;  Surgeon: Marinus Maw, MD;  Location: [facility] INVASIVE CV LAB;  Service: Cardiovascular;  Laterality: N/A;  . FINGER FRACTURE SURGERY  2004  . KIDNEY STONES     REMOVAL   . KNEE ARTHROSCOPY     RIGHT KNEE  . TONSILLECTOMY      SOCIAL HISTORY: Social History   Tobacco Use  . Smoking status: Former Smoker    Packs/day: 1.00    Years: 18.00    Pack years: 18.00    Types: Cigarettes    Last attempt to quit: 09/03/1987    Years since quitting:  31.1  . Smokeless tobacco: Never Used  Substance Use Topics  . Alcohol use: No  . Drug use: No    FAMILY HISTORY: Family History  Problem Relation Age of Onset  . Heart attack Father 7  . Hyperlipidemia Father   . Diabetes Father   . Hypertension Father   . Heart disease Father   . Fibromyalgia Sister   . Stroke Mother   . Arrhythmia Mother        atrial fib.  . Depression Mother   . Anxiety disorder Mother   . Bipolar disorder Mother     ROS: Review of Systems  Constitutional: Negative for weight loss.  Gastrointestinal: Negative for nausea and vomiting.  Genitourinary: Negative for frequency.  Musculoskeletal:       Negative muscle weakness  Endo/Heme/Allergies: Negative for polydipsia.       Negative hypoglycemia    PHYSICAL EXAM: Blood pressure 124/75, pulse 84, height  (1.676 m), weight 242 lb (109.8 kg), SpO2 97 %. Body mass index is 39.06 kg/m. Physical Exam Vitals signs reviewed.  Constitutional:      Appearance: Normal appearance. He is obese.  Cardiovascular:     Rate and Rhythm: Normal rate.     Pulses: Normal pulses.  Pulmonary:     Effort: Pulmonary effort is normal.     Breath sounds: Normal breath sounds.  Musculoskeletal: Normal range of motion.  Skin:    General: Skin is warm and dry.    Neurological:     Mental Status: He is alert and oriented to person, place, and time.  Psychiatric:        Mood and Affect: Mood normal.        Behavior: Behavior normal.     RECENT LABS AND TESTS: BMET    Component Value Date/Time   NA 142 09/15/2018 0758   K 4.6 09/15/2018 0758   CL 103 09/15/2018 0758   CO2 21 09/15/2018 0758   GLUCOSE 94 09/15/2018 0758   GLUCOSE 95 12/20/2017 1728   BUN 21 09/15/2018 0758   CREATININE 1.15 09/15/2018 0758   CALCIUM 9.3 09/15/2018 0758   GFRNONAA 67 09/15/2018 0758   GFRAA 77 09/15/2018 0758   Lab Results  Component Value Date   HGBA1C 5.9 (H) 09/15/2018   HGBA1C 5.9 (H) 04/16/2018   HGBA1C 5.8 (H) 12/02/2015   HGBA1C 5.4 03/14/2012   Lab Results  Component Value Date   INSULIN 11.3 09/15/2018   INSULIN 15.2 04/16/2018   CBC    Component Value Date/Time   WBC 9.3 04/16/2018 0943   WBC 8.9 12/20/2017 1728   RBC 4.78 04/16/2018 0943   RBC 4.68 12/20/2017 1728   HGB 14.0 04/16/2018 0943   HCT 42.5 04/16/2018 0943   PLT 251 12/20/2017 1728   MCV 89 04/16/2018 0943   MCH 29.3 04/16/2018 0943   MCH 29.9 12/20/2017 1728   MCHC 32.9 04/16/2018 0943   MCHC 33.6 12/20/2017 1728   RDW 14.4 04/16/2018 0943   LYMPHSABS 1.8 04/16/2018 0943   MONOABS 0.9 12/01/2015 1915   EOSABS 0.2 04/16/2018 0943   BASOSABS 0.1 04/16/2018 0943   Iron/TIBC/Ferritin/ %Sat No results found for: IRON, TIBC, FERRITIN, IRONPCTSAT Lipid Panel     Component Value Date/Time   CHOL 155 09/15/2018 0758   TRIG 86 09/15/2018 0758   HDL 39 (L) 09/15/2018 0758   CHOLHDL 3.8 12/02/2015 0212   VLDL 13 12/02/2015 0212   LDLCALC 99 09/15/2018 0758   Hepatic Function Panel  Component Value Date/Time   PROT 6.6 09/15/2018 0758   ALBUMIN 4.3 09/15/2018 0758   AST 12 09/15/2018 0758   ALT 13 09/15/2018 0758   ALKPHOS 131 (H) 09/15/2018 0758   BILITOT 0.6 09/15/2018 0758       OBESITY BEHAVIORAL INTERVENTION VISIT  Today's visit was # 10    Starting weight: 250 lbs Starting date: 04/16/18 Today's weight : 242 lbs  Today's date: 10/26/2018 Total lbs lost to date: 8    10/26/2018  Height  (1.676 m)  Weight 242 lb (109.8 kg)  BMI (Calculated) 39.08  BLOOD PRESSURE - SYSTOLIC 124  BLOOD PRESSURE - DIASTOLIC 75   Body Fat % 37.8 %     ASK: We discussed the diagnosis of obesity with [name] today and [name] agreed to give [place] permission to discuss obesity behavioral modification therapy today.  ASSESS: Burt has the diagnosis of obesity and his BMI today is 39.08 Mckenzie is in the action stage of change   ADVISE: Zeev was educated on the multiple health risks of obesity as well as the benefit of weight loss to improve his health. He was advised of the need for long term treatment and the importance of lifestyle modifications to improve his current health and to decrease his risk of future health problems.  AGREE: Multiple dietary modification options and treatment options were discussed and  Demetre agreed to follow the recommendations documented in the above note.  ARRANGE: Aimee was educated on the importance of frequent visits to treat obesity as outlined per CMS and USPSTF guidelines and agreed to schedule his next follow up appointment today.  I, Burt Knack, am acting as transcriptionist for Quillian Quince, MD  I have reviewed the above documentation for accuracy and completeness, and I agree with the above. -Quillian Quince, MD

## 2018-10-27 MED ORDER — METFORMIN HCL 500 MG PO TABS: 500.0000 mg | ORAL_TABLET | Freq: Two times a day (BID) | ORAL | 0 refills | Status: DC

## 2018-10-27 MED ORDER — VITAMIN D (ERGOCALCIFEROL) 1.25 MG (50000 UNIT) PO CAPS: 50000.0000 [IU] | ORAL_CAPSULE | ORAL | 0 refills | Status: DC

## 2018-11-16 ENCOUNTER — Ambulatory Visit (INDEPENDENT_AMBULATORY_CARE_PROVIDER_SITE_OTHER): Payer: BLUE CROSS/BLUE SHIELD | Admitting: Family Medicine

## 2018-11-26 ENCOUNTER — Encounter (INDEPENDENT_AMBULATORY_CARE_PROVIDER_SITE_OTHER): Payer: Self-pay | Admitting: Family Medicine

## 2018-11-26 ENCOUNTER — Other Ambulatory Visit: Payer: Self-pay

## 2018-11-26 ENCOUNTER — Ambulatory Visit (INDEPENDENT_AMBULATORY_CARE_PROVIDER_SITE_OTHER): Payer: BLUE CROSS/BLUE SHIELD | Admitting: Family Medicine

## 2018-11-26 ENCOUNTER — Encounter (INDEPENDENT_AMBULATORY_CARE_PROVIDER_SITE_OTHER): Payer: Self-pay

## 2018-11-26 DIAGNOSIS — E559 Vitamin D deficiency, unspecified: Secondary | ICD-10-CM

## 2018-11-26 DIAGNOSIS — R7303 Prediabetes: Secondary | ICD-10-CM

## 2018-11-26 DIAGNOSIS — Z6839 Body mass index (BMI) 39.0-39.9, adult: Secondary | ICD-10-CM

## 2018-11-26 MED ORDER — METFORMIN HCL 500 MG PO TABS: 500.0000 mg | ORAL_TABLET | Freq: Two times a day (BID) | ORAL | 0 refills | Status: DC

## 2018-11-26 MED ORDER — VITAMIN D (ERGOCALCIFEROL) 1.25 MG (50000 UNIT) PO CAPS: 50000.0000 [IU] | ORAL_CAPSULE | ORAL | 0 refills | Status: DC

## 2018-11-26 NOTE — Progress Notes (Signed)
Office: [phone]  /  Fax: [phone] TeleHealth Visit:  [name] has consented to this TeleHealth visit today via Skype. The patient is located at home, the provider is located at the [facility] office. The participants in this visit include the listed provider and patient.  HPI:   Chief Complaint: OBESITY Sean Winters is here to discuss his progress with his obesity treatment plan. He is on the Category 3 plan and is following his eating plan approximately 75 % of the time. He states he is walking the dog for 30 minutes 7 times per week. 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 He is trying to be mindful of his food choices, but struggling to find healthier options at the grocery store.  We were unable to weight the patient today for this TeleHealth visit. He feels as if he has maintained weight since his last visit. He has lost 8 lbs since starting treatment with us.  Pre-Diabetes Sean Winters has a diagnosis of pre-diabetes based on his elevated Hgb A1c and was informed this puts him at greater risk of developing diabetes. He is struggling to take his 2nd dose of metformin. Sean Winters notes increased polyphagia especially if he misses this dose. He continues to work on diet and exercise to decrease risk of diabetes.   Vitamin D Deficiency Sean Winters has a diagnosis of vitamin D deficiency. He is currently on vit D, but is not yet at goal. Sean Winters denies nausea, vomiting, or muscle weakness.  ASSESSMENT AND PLAN:  Prediabetes - Plan: metFORMIN (GLUCOPHAGE) 500 MG tablet  Vitamin D deficiency - Plan: Vitamin D, Ergocalciferol, (DRISDOL) 1.25 MG (50000 UT) CAPS capsule  Class 2 severe obesity with serious comorbidity and body mass index (BMI) of 39.0 to 39.9 in adult, unspecified obesity type (HCC)  PLAN:   Pre-Diabetes Sean Winters will continue to work on weight loss, exercise, and decreasing simple carbohydrates in his diet to help decrease the risk of diabetes. He was informed that eating too many simple carbohydrates or too many calories at one sitting increases the likelihood of GI side effects. Sean Winters agreed to continue metformin 500 mg BID #60 with no refills and a prescription was written today. Sean Winters agreed to follow up with us as directed to monitor his progress in 2 weeks.  Vitamin D Deficiency Sean Winters was informed that low vitamin D levels contribute to fatigue and are associated with obesity, breast, and colon cancer. Sean Winters agrees to continue to take prescription Vit D @50 ,000 IU every week #4 with no refills and will follow up for routine testing of vitamin D, at least 2-3 times per year. He was informed of the risk of over-replacement of vitamin D and agrees to not increase his dose unless he discusses this with us first. Sean Winters agrees to follow up in 2 weeks as directed.  Obesity Sean Winters is currently in the action stage of change. As such, his goal is to continue with weight loss efforts. He has agreed to follow the Category 3 plan. Sean Winters has been instructed to work up to a goal of 150 minutes of combined cardio and strengthening exercise per week for weight loss and overall health benefits. We discussed the following Behavioral Modification Strategies today: increasing lean protein intake, decreasing simple carbohydrates, work on meal planning and easy cooking plans, emotional eating strategies, ways to avoid boredom eating, keeping  healthy foods in the home, better snacking choices, and ways to avoid night time snacking.  [name] has agreed to follow up with our clinic in 2 weeks. He was informed of the importance of frequent follow up visits to maximize his success with intensive lifestyle modifications for his multiple health conditions.  ALLERGIES: Allergies  Allergen  Reactions  . Haloperidol Other (See Comments)    Fatigue  . Ibuprofen Other (See Comments)    Cannot take while on lithium  . Sulfa Antibiotics Rash  . Sulfamethoxazole Rash    MEDICATIONS: Current Outpatient Medications on File Prior to Visit  Medication Sig Dispense Refill  . ASHWAGANDHA PO Take 250 mg by mouth once.    [name] aspirin EC 81 MG tablet Take 81 mg by mouth daily.    [name] atorvastatin (LIPITOR) 20 MG tablet Take 20 mg by mouth Daily.     . furosemide (LASIX) 40 MG tablet Take 2 tablets (80 mg total) by mouth daily. 60 tablet 6  . lamoTRIgine (LAMICTAL) 25 MG tablet Take 75 mg by mouth daily.  4  . lithium carbonate (LITHOBID) 300 MG CR tablet Take 900 mg by mouth at bedtime.  4  . OVER THE COUNTER MEDICATION Take 3 capsules by mouth daily. CocoaVia- for memory and blood flow    . OVER THE COUNTER MEDICATION Take 2 tablets by mouth daily. For memory - Sensoril 250 mg tablets    . oxybutynin (DITROPAN-XL) 10 MG 24 hr tablet Take 10 mg by mouth daily.    . potassium chloride SA (K-DUR,KLOR-CON) 20 MEQ tablet Take 2 tablets (40 mEq total) by mouth daily. 180 tablet 3  . pramipexole (MIRAPEX) 1.5 MG tablet Take 1.5 mg by mouth at bedtime.     [name] zolpidem (AMBIEN) 10 MG tablet Take 10 mg by mouth at bedtime.      No current facility-administered medications on file prior to visit.     PAST MEDICAL HISTORY: Past Medical History:  Diagnosis Date  . Anxiety   . Bipolar disorder (HCC)   . BPH (benign prostatic hypertrophy)   . Chest pain   . Complete heart block (HCC)    a. s/p Medtronic PPM 2017  . Cystitis, acute   . Depression   . Diverticulitis   . Dyslipidemia   . Dyspnea   . Edema   . Lumbago   . Nephrolithiasis   . Pulmonary embolism (HCC) 10/2009    PAST SURGICAL HISTORY: Past Surgical History:  Procedure Laterality Date  . EP IMPLANTABLE DEVICE N/A 12/04/2015   Procedure: Pacemaker Implant;  Surgeon: Marinus Maw, MD;  Location: [facility] INVASIVE CV LAB;  Service:  Cardiovascular;  Laterality: N/A;  . FINGER FRACTURE SURGERY  2004  . KIDNEY STONES     REMOVAL   . KNEE ARTHROSCOPY     RIGHT KNEE  . TONSILLECTOMY      SOCIAL HISTORY: Social History   Tobacco Use  . Smoking status: Former Smoker    Packs/day: 1.00    Years: 18.00    Pack years: 18.00    Types: Cigarettes    Last attempt to quit: 09/03/1987    Years since quitting: 31.2  . Smokeless tobacco: Never Used  Substance Use Topics  . Alcohol use: No  . Drug use: No    FAMILY HISTORY: Family History  Problem Relation Age of Onset  . Heart attack Father 42  . Hyperlipidemia Father   . Diabetes Father   . Hypertension Father   .  Heart disease Father   . Fibromyalgia Sister   . Stroke Mother   . Arrhythmia Mother        atrial fib.  . Depression Mother   . Anxiety disorder Mother   . Bipolar disorder Mother    ROS: Review of Systems  Gastrointestinal: Negative for nausea and vomiting.  Musculoskeletal:       Negative for muscle weakness.  Endo/Heme/Allergies:       Positive for polyphagia.   PHYSICAL EXAM: Pt in no acute distress  RECENT LABS AND TESTS: BMET    Component Value Date/Time   NA 142 09/15/2018 0758   K 4.6 09/15/2018 0758   CL 103 09/15/2018 0758   CO2 21 09/15/2018 0758   GLUCOSE 94 09/15/2018 0758   GLUCOSE 95 12/20/2017 1728   BUN 21 09/15/2018 0758   CREATININE 1.15 09/15/2018 0758   CALCIUM 9.3 09/15/2018 0758   GFRNONAA 67 09/15/2018 0758   GFRAA 77 09/15/2018 0758   Lab Results  Component Value Date   HGBA1C 5.9 (H) 09/15/2018   HGBA1C 5.9 (H) 04/16/2018   HGBA1C 5.8 (H) 12/02/2015   HGBA1C 5.4 03/14/2012   Lab Results  Component Value Date   INSULIN 11.3 09/15/2018   INSULIN 15.2 04/16/2018   CBC    Component Value Date/Time   WBC 9.3 04/16/2018 0943   WBC 8.9 12/20/2017 1728   RBC 4.78 04/16/2018 0943   RBC 4.68 12/20/2017 1728   HGB 14.0 04/16/2018 0943   HCT 42.5 04/16/2018 0943   PLT 251 12/20/2017 1728   MCV 89  04/16/2018 0943   MCH 29.3 04/16/2018 0943   MCH 29.9 12/20/2017 1728   MCHC 32.9 04/16/2018 0943   MCHC 33.6 12/20/2017 1728   RDW 14.4 04/16/2018 0943   LYMPHSABS 1.8 04/16/2018 0943   MONOABS 0.9 12/01/2015 1915   EOSABS 0.2 04/16/2018 0943   BASOSABS 0.1 04/16/2018 0943   Iron/TIBC/Ferritin/ %Sat No results found for: IRON, TIBC, FERRITIN, IRONPCTSAT Lipid Panel     Component Value Date/Time   CHOL 155 09/15/2018 0758   TRIG 86 09/15/2018 0758   HDL 39 (L) 09/15/2018 0758   CHOLHDL 3.8 12/02/2015 0212   VLDL 13 12/02/2015 0212   LDLCALC 99 09/15/2018 0758   Hepatic Function Panel     Component Value Date/Time   PROT 6.6 09/15/2018 0758   ALBUMIN 4.3 09/15/2018 0758   AST 12 09/15/2018 0758   ALT 13 09/15/2018 0758   ALKPHOS 131 (H) 09/15/2018 0758   BILITOT 0.6 09/15/2018 0758      Component Value Date/Time   TSH 1.170 04/16/2018 0943   TSH 4.063 12/02/2015 0212   TSH 1.188   Test methodology is 3rd generation TSH  10/23/2009 0530   Results for NAADIR, HOKAMA (MRN [id number]) as of 11/26/2018 12:57  Ref. Range 09/15/2018 07:58  Vitamin D, 25-Hydroxy Latest Ref Range: 30.0 - 100.0 ng/mL 39.9     I, Kirke Corin, CMA, am acting as transcriptionist for Wilder Glade, MD  I have reviewed the above documentation for accuracy and completeness, and I agree with the above. -Quillian Quince, MD

## 2018-12-02 ENCOUNTER — Emergency Department (HOSPITAL_COMMUNITY): Payer: BLUE CROSS/BLUE SHIELD

## 2018-12-02 ENCOUNTER — Other Ambulatory Visit: Payer: Self-pay

## 2018-12-02 ENCOUNTER — Encounter (HOSPITAL_COMMUNITY): Payer: Self-pay | Admitting: Emergency Medicine

## 2018-12-02 ENCOUNTER — Emergency Department (HOSPITAL_COMMUNITY)
Admission: EM | Admit: 2018-12-02 | Discharge: 2018-12-02 | Disposition: A | Discharge status: Home / Self Care | Payer: BLUE CROSS/BLUE SHIELD | Attending: Emergency Medicine | Admitting: Emergency Medicine

## 2018-12-02 DIAGNOSIS — Z79899 Other long term (current) drug therapy: Secondary | ICD-10-CM | POA: Insufficient documentation

## 2018-12-02 DIAGNOSIS — Y69 Unspecified misadventure during surgical and medical care: Secondary | ICD-10-CM | POA: Diagnosis not present

## 2018-12-02 DIAGNOSIS — R42 Dizziness and giddiness: Secondary | ICD-10-CM | POA: Diagnosis not present

## 2018-12-02 DIAGNOSIS — S0083XA Contusion of other part of head, initial encounter: Secondary | ICD-10-CM | POA: Insufficient documentation

## 2018-12-02 DIAGNOSIS — Y92003 Bedroom of unspecified non-institutional (private) residence as the place of occurrence of the external cause: Secondary | ICD-10-CM | POA: Insufficient documentation

## 2018-12-02 DIAGNOSIS — Z7982 Long term (current) use of aspirin: Secondary | ICD-10-CM | POA: Insufficient documentation

## 2018-12-02 DIAGNOSIS — R410 Disorientation, unspecified: Secondary | ICD-10-CM | POA: Diagnosis not present

## 2018-12-02 DIAGNOSIS — H1132 Conjunctival hemorrhage, left eye: Secondary | ICD-10-CM | POA: Insufficient documentation

## 2018-12-02 DIAGNOSIS — W19XXXA Unspecified fall, initial encounter: Secondary | ICD-10-CM | POA: Diagnosis not present

## 2018-12-02 DIAGNOSIS — Y999 Unspecified external cause status: Secondary | ICD-10-CM | POA: Diagnosis not present

## 2018-12-02 DIAGNOSIS — T887XXA Unspecified adverse effect of drug or medicament, initial encounter: Secondary | ICD-10-CM | POA: Diagnosis not present

## 2018-12-02 DIAGNOSIS — Y939 Activity, unspecified: Secondary | ICD-10-CM | POA: Diagnosis not present

## 2018-12-02 DIAGNOSIS — R2243 Localized swelling, mass and lump, lower limb, bilateral: Secondary | ICD-10-CM | POA: Diagnosis not present

## 2018-12-02 DIAGNOSIS — Z950 Presence of cardiac pacemaker: Secondary | ICD-10-CM | POA: Diagnosis not present

## 2018-12-02 DIAGNOSIS — Z87891 Personal history of nicotine dependence: Secondary | ICD-10-CM | POA: Diagnosis not present

## 2018-12-02 DIAGNOSIS — S0993XA Unspecified injury of face, initial encounter: Secondary | ICD-10-CM | POA: Diagnosis present

## 2018-12-02 LAB — COMPREHENSIVE METABOLIC PANEL
ALT: 11 U/L (ref 0–44)
AST: 14 U/L — ABNORMAL LOW (ref 15–41)
Albumin: 3.5 g/dL (ref 3.5–5.0)
Alkaline Phosphatase: 105 U/L (ref 38–126)
Anion gap: 8 (ref 5–15)
BUN: 17 mg/dL (ref 8–23)
CO2: 27 mmol/L (ref 22–32)
Calcium: 8.8 mg/dL — ABNORMAL LOW (ref 8.9–10.3)
Chloride: 105 mmol/L (ref 98–111)
Creatinine, Ser: 1.36 mg/dL — ABNORMAL HIGH (ref 0.61–1.24)
GFR calc Af Amer: >60 mL/min (ref >60)
GFR calc non Af Amer: 55 mL/min — ABNORMAL LOW (ref >60)
Glucose, Bld: 99 mg/dL (ref 70–99)
Potassium: 4.2 mmol/L (ref 3.5–5.1)
Sodium: 140 mmol/L (ref 135–145)
Total Bilirubin: 0.7 mg/dL (ref 0.3–1.2)
Total Protein: 6.5 g/dL (ref 6.5–8.1)

## 2018-12-02 LAB — CBC WITH DIFFERENTIAL/PLATELET
Abs Immature Granulocytes: 0.04 10*3/uL (ref 0.00–0.07)
Basophils Absolute: 0.1 10*3/uL (ref 0.0–0.1)
Basophils Relative: 1 %
Eosinophils Absolute: 0.1 10*3/uL (ref 0.0–0.5)
Eosinophils Relative: 1 %
HCT: 43 % (ref 39.0–52.0)
Hemoglobin: 13.1 g/dL (ref 13.0–17.0)
Immature Granulocytes: 0 %
Lymphocytes Relative: 10 %
Lymphs Abs: 1.1 10*3/uL (ref 0.7–4.0)
MCH: 27.5 pg (ref 26.0–34.0)
MCHC: 30.5 g/dL (ref 30.0–36.0)
MCV: 90.3 fL (ref 80.0–100.0)
Monocytes Absolute: 0.8 10*3/uL (ref 0.1–1.0)
Monocytes Relative: 7 %
Neutro Abs: 9.6 10*3/uL — ABNORMAL HIGH (ref 1.7–7.7)
Neutrophils Relative %: 81 %
Platelets: 276 10*3/uL (ref 150–400)
RBC: 4.76 MIL/uL (ref 4.22–5.81)
RDW: 13.6 % (ref 11.5–15.5)
WBC: 11.8 10*3/uL — ABNORMAL HIGH (ref 4.0–10.5)
nRBC: 0 % (ref 0.0–0.2)

## 2018-12-02 LAB — LITHIUM LEVEL: Lithium Lvl: 0.68 mmol/L (ref 0.60–1.20)

## 2018-12-02 LAB — URINALYSIS, ROUTINE W REFLEX MICROSCOPIC
Bilirubin Urine: NEGATIVE
Glucose, UA: NEGATIVE mg/dL
Hgb urine dipstick: NEGATIVE
Ketones, ur: NEGATIVE mg/dL
Leukocytes,Ua: NEGATIVE
Nitrite: NEGATIVE
Protein, ur: NEGATIVE mg/dL
Specific Gravity, Urine: 1.009 (ref 1.005–1.030)
pH: 7 (ref 5.0–8.0)

## 2018-12-02 LAB — RAPID URINE DRUG SCREEN, HOSP PERFORMED
Amphetamines: NOT DETECTED
Barbiturates: NOT DETECTED
Benzodiazepines: NOT DETECTED
Cocaine: NOT DETECTED
Opiates: NOT DETECTED
Tetrahydrocannabinol: NOT DETECTED

## 2018-12-02 LAB — ETHANOL: Alcohol, Ethyl (B): <10 mg/dL (ref <10)

## 2018-12-02 LAB — SALICYLATE LEVEL: Salicylate Lvl: <7 mg/dL (ref 2.8–30.0)

## 2018-12-02 LAB — I-STAT TROPONIN, ED: Troponin i, poc: 0 ng/mL (ref 0.00–0.08)

## 2018-12-02 LAB — ACETAMINOPHEN LEVEL: Acetaminophen (Tylenol), Serum: <10 ug/mL — ABNORMAL LOW (ref 10–30)

## 2018-12-02 LAB — AMMONIA: Ammonia: 18 umol/L (ref 9–35)

## 2018-12-02 NOTE — ED Notes (Signed)
Went in to check on pt. Pt is A&Ox4.

## 2018-12-02 NOTE — ED Notes (Signed)
Ambulated pt in the hallway. Pt stated he was a little lightheaded but felt fine walking. Pt walked without any assistance from staff

## 2018-12-02 NOTE — ED Triage Notes (Signed)
Pt in with AMS, took 3 Ambien last night in efforts to get sleep. Fell OOB this am, has L forehead lac. No thinners, denies taking any extra doses in effort to harm self. Freq urination as well, has freq UTI's. A&Ox2, arrives in c-collar

## 2018-12-02 NOTE — ED Notes (Signed)
Sean Winters-(wife-[phone]) called for update/would like call back.

## 2018-12-02 NOTE — ED Notes (Signed)
Patient verbalizes understanding of discharge instructions. Opportunity for questioning and answers were provided. Armband removed by staff, pt discharged from ED via wheelchair to home.  

## 2018-12-02 NOTE — ED Notes (Signed)
Per Medtronic, patient has had cardiac events indicated by his pacemaker since Nov. 2019. Dr. Anitra Lauth updated.

## 2018-12-02 NOTE — ED Notes (Signed)
Pt given applesauce and ginger ale to eat and drink. Will attempt to walk pt after he is finished

## 2018-12-02 NOTE — ED Notes (Signed)
Patient initially reports a positive COVID-19 contact with "nurse who I had last week whose husband tested positive I think." Patient afebrile, denies SOB, chills, cough, sore throat, N/V/D, or any other symptoms at this time. Spoke with Dr. Anitra Lauth, patient not placed on any precautions due to poor historian and lack of symptoms.

## 2018-12-02 NOTE — ED Notes (Addendum)
At approx 0945 upon entering patient's room, patient found by Dr. Anitra Lauth sitting on the floor with back against the door. Patient still hooked up to monitor and both side rails were up. Patient reported he had to use the restroom and attempted to climb off the bottom of the bed. Patient noted to have voided on himself. Patient assisted back onto stretcher, gown changed and briefs removed. No significant changes noted to patient's prior injuries. Swelling and laceration to L eye noted to have remained unchanged. Pt reports neck and back pain that was consistent with initial fall prior to arrival. Patient appears much more alert and oriented at this time. This RN emphasized need for patient to use the call light when assistance is needed. A&Ox4. Will continue to monitor and round on patient more frequently, reminding patient to use the call light. Urinal placed on stretcher for patient.

## 2018-12-02 NOTE — ED Notes (Signed)
Patient transported to radiology

## 2018-12-02 NOTE — ED Notes (Signed)
Pt verbalized understanding regarding need for urine specimen. Pt does not need to void at this time.  

## 2018-12-02 NOTE — ED Notes (Signed)
Pt reports he does not have to void at this time.

## 2018-12-02 NOTE — ED Notes (Signed)
Pt wife Wynona Canes would like an update when possible [phone]

## 2018-12-02 NOTE — ED Notes (Signed)
Patient arousable to voice and alert and oriented x 3, when asked where he is patient answered "hospital in [place]", patient reoriented. Patient appears to be falling asleep when answering questions and performing NIHSS. Patient easily arousable. Facial symmetry noted, sensation and mobility intact.

## 2018-12-02 NOTE — ED Provider Notes (Signed)
[facility] EMERGENCY DEPARTMENT Provider Note   CSN: [id number] Arrival date & time: 12/02/18  9024    History   Chief Complaint Chief Complaint  Patient presents with  . Fall  . Altered Mental Status    HPI Sean Winters is a 63 y.o. male.     Patient is a 63 year old male with a history of complete heart block with Medtronic pacer, PE, bipolar disease, chronic edema in the lower extremities presenting today with altered mental status and a fall.  Wife reported finding him laying on the floor this morning next to his bed.  Patient reported he took 3 Ambien last night to try to get some sleep but he is not able to give much more history.  Most of the history came from paramedics.  They stated that wife had not seen him for a few days and is unclear when he was last normal.  The patient is sometimes lucid and able to answer questions appropriately but other times does not make any sense.  He states he has had chronic edema in his lower extremities but no new swelling.  He denies any pain.  He denies feeling short of breath.  Patient does take lithium but per spouse that has not changed.  The history is provided by the patient, the EMS personnel and the spouse.  Fall   Altered Mental Status    Past Medical History:  Diagnosis Date  . Anxiety   . Bipolar disorder (HCC)   . BPH (benign prostatic hypertrophy)   . Chest pain   . Complete heart block (HCC)    a. s/p Medtronic PPM 2017  . Cystitis, acute   . Depression   . Diverticulitis   . Dyslipidemia   . Dyspnea   . Edema   . Lumbago   . Nephrolithiasis   . Pulmonary embolism (HCC) 10/2009    Patient Active Problem List   Diagnosis Date Noted  . Other fatigue 04/16/2018  . Shortness of breath on exertion 04/16/2018  . Other hyperlipidemia 04/16/2018  . Edema 11/10/2017  . Heart block 12/02/2015  . Complete heart block (HCC) 12/01/2015  . Healthcare maintenance 11/02/2014  . Acute delirium  03/14/2012  . Pulmonary embolism (HCC)   . Dyslipidemia   . Cystitis, acute   . Diverticulitis   . Lumbago   . Nephrolithiasis   . BPH (benign prostatic hypertrophy)   . Chest pain   . Dyspnea   . CHEST PAIN-UNSPECIFIED 09/10/2010  . SNORING 03/12/2010  . RLQ PAIN 11/02/2009  . Hyperlipidemia 11/01/2009  . History of pulmonary embolism 11/01/2009  . BENIGN PROSTATIC HYPERTROPHY, HX OF 11/01/2009    Past Surgical History:  Procedure Laterality Date  . EP IMPLANTABLE DEVICE N/A 12/04/2015   Procedure: Pacemaker Implant;  Surgeon: Marinus Maw, MD;  Location: [facility] INVASIVE CV LAB;  Service: Cardiovascular;  Laterality: N/A;  . FINGER FRACTURE SURGERY  2004  . KIDNEY STONES     REMOVAL   . KNEE ARTHROSCOPY     RIGHT KNEE  . TONSILLECTOMY          Home Medications    Prior to Admission medications   Medication Sig Start Date End Date Taking? Authorizing Provider  ASHWAGANDHA PO Take 250 mg by mouth once.    [provider]  aspirin EC 81 MG tablet Take 81 mg by mouth daily.    [provider]  atorvastatin (LIPITOR) 20 MG tablet Take 20 mg by mouth  Daily.  01/18/12   [provider]  furosemide (LASIX) 40 MG tablet Take 2 tablets (80 mg total) by mouth daily. 08/04/18   Marinus Maw, MD  lamoTRIgine (LAMICTAL) 25 MG tablet Take 75 mg by mouth daily. 11/03/15   [provider]  lithium carbonate (LITHOBID) 300 MG CR tablet Take 900 mg by mouth at bedtime. 11/15/15   [provider]  metFORMIN (GLUCOPHAGE) 500 MG tablet Take 1 tablet (500 mg total) by mouth 2 (two) times daily with a meal. 11/26/18   Quillian Quince D, MD  OVER THE COUNTER MEDICATION Take 3 capsules by mouth daily. CocoaVia- for memory and blood flow    [provider]  OVER THE COUNTER MEDICATION Take 2 tablets by mouth daily. For memory - Sensoril 250 mg tablets    [provider]  oxybutynin (DITROPAN-XL) 10 MG 24 hr tablet Take 10 mg by mouth daily.  11/07/16   [provider]  potassium chloride SA (K-DUR,KLOR-CON) 20 MEQ tablet Take 2 tablets (40 mEq total) by mouth daily. 03/10/18 06/08/18  Marinus Maw, MD  pramipexole (MIRAPEX) 1.5 MG tablet Take 1.5 mg by mouth at bedtime.     [provider]  Vitamin D, Ergocalciferol, (DRISDOL) 1.25 MG (50000 UT) CAPS capsule Take 1 capsule (50,000 Units total) by mouth every 7 (seven) days. 11/26/18   Quillian Quince D, MD  zolpidem (AMBIEN) 10 MG tablet Take 10 mg by mouth at bedtime.     [provider]    Family History Family History  Problem Relation Age of Onset  . Heart attack Father 32  . Hyperlipidemia Father   . Diabetes Father   . Hypertension Father   . Heart disease Father   . Fibromyalgia Sister   . Stroke Mother   . Arrhythmia Mother        atrial fib.  . Depression Mother   . Anxiety disorder Mother   . Bipolar disorder Mother     Social History Social History   Tobacco Use  . Smoking status: Former Smoker    Packs/day: 1.00    Years: 18.00    Pack years: 18.00    Types: Cigarettes    Last attempt to quit: 09/03/1987    Years since quitting: 31.2  . Smokeless tobacco: Never Used  Substance Use Topics  . Alcohol use: No  . Drug use: No     Allergies   Haloperidol; Ibuprofen; Sulfa antibiotics; and Sulfamethoxazole   Review of Systems Review of Systems  Unable to perform ROS: Mental status change     Physical Exam Updated Vital Signs BP 114/68   Pulse 65   Resp 19   Ht  (1.727 m)   Wt 102.1 kg   SpO2 96%   BMI 34.21 kg/m   Physical Exam Vitals signs and nursing note reviewed.  Constitutional:      General: He is sleeping. He is not in acute distress.    Appearance: He is well-developed. He is obese.     Comments: Can be aroused by voice  HENT:     Head: Normocephalic. Contusion present.      Right Ear: Tympanic membrane normal.     Left Ear: Tympanic membrane normal.     Mouth/Throat:     Mouth: Mucous  membranes are moist.  Eyes:     Conjunctiva/sclera: Conjunctivae normal.     Pupils: Pupils are equal, round, and reactive to light.     Comments: Pupils  57mm and reactive and left eye subconjunctival hemorrhage.  Neck:     Musculoskeletal: No spinous process tenderness or muscular tenderness.     Comments: Currently in c-collar due to fall Cardiovascular:     Rate and Rhythm: Normal rate and regular rhythm.     Heart sounds: No murmur.     Comments: Pacemaker present in the left upper chest Pulmonary:     Effort: Pulmonary effort is normal. No respiratory distress.     Breath sounds: Normal breath sounds. No wheezing or rales.  Abdominal:     General: There is no distension.     Palpations: Abdomen is soft.     Tenderness: There is no abdominal tenderness. There is no guarding or rebound.  Musculoskeletal: Normal range of motion.        General: No tenderness.     Right lower leg: Edema present.     Left lower leg: Edema present.     Comments: 2-3+ pitting edema in bilateral lower ext  Skin:    General: Skin is warm and dry.     Findings: No erythema or rash.  Neurological:     Mental Status: He is disoriented and confused.     Cranial Nerves: No dysarthria or facial asymmetry.     Sensory: Sensation is intact.     Motor: Motor function is intact.     Comments: No facial asymmetry, patient is able to move upper and lower extremities without difficulty.  He denies any numbness.  Patient speech is clear but sometimes he talks about things that do not make any sense at all  Psychiatric:     Comments: Calm and cooperative but appears confused and sleepy      ED Treatments / Results  Labs (all labs ordered are listed, but only abnormal results are displayed) Labs Reviewed  CBC WITH DIFFERENTIAL/PLATELET - Abnormal; Notable for the following components:      Result Value   WBC 11.8 (*)    Neutro Abs 9.6 (*)    All other components within normal limits  COMPREHENSIVE  METABOLIC PANEL - Abnormal; Notable for the following components:   Creatinine, Ser 1.36 (*)    Calcium 8.8 (*)    AST 14 (*)    GFR calc non Af Amer 55 (*)    All other components within normal limits  ACETAMINOPHEN LEVEL - Abnormal; Notable for the following components:   Acetaminophen (Tylenol), Serum <10 (*)    All other components within normal limits  ETHANOL  RAPID URINE DRUG SCREEN, HOSP PERFORMED  SALICYLATE LEVEL  LITHIUM LEVEL  AMMONIA  URINALYSIS, ROUTINE W REFLEX MICROSCOPIC  I-STAT TROPONIN, ED    EKG EKG Interpretation  Date/Time:  Wednesday December 02 2018 07:40:51 EDT Ventricular Rate:  72 PR Interval:    QRS Duration: 99 QT Interval:  417 QTC Calculation: 457 R Axis:   78 Text Interpretation:  Sinus rhythm Prolonged PR interval Borderline T abnormalities, inferior leads EKG similar to 10/22/2009 Confirmed by Gwyneth Sprout ([id number]) on 12/02/2018 7:48:24 AM   Radiology Dg Chest 2 View  Result Date: 12/02/2018 CLINICAL DATA:  Altered mental status.  Questionable fall. EXAM: CHEST - 2 VIEW COMPARISON:  12/20/2017; 12/05/2015; 12/01/2015; chest CT-12/01/2015 FINDINGS: Grossly unchanged enlarged cardiac silhouette and mediastinal contours given slightly reduced lung volumes. Stable positioning of support apparatus. Minimal left basilar heterogeneous opacities favored to represent atelectasis or scar. No discrete focal airspace opacities. No pleural effusion or pneumothorax. No evidence of edema. No acute  osseous abnormalities. IMPRESSION: Cardiomegaly without superimposed acute cardiopulmonary disease on this hypoventilated examination. Electronically Signed   By: Simonne Come M.D.   On: 12/02/2018 08:43   Ct Head Wo Contrast  Result Date: 12/02/2018 CLINICAL DATA:  63 year old male with a history fall EXAM: CT HEAD WITHOUT CONTRAST CT CERVICAL SPINE WITHOUT CONTRAST TECHNIQUE: Multidetector CT imaging of the head and cervical spine was performed following the standard  protocol without intravenous contrast. Multiplanar CT image reconstructions of the cervical spine were also generated. COMPARISON:  None. FINDINGS: CT HEAD FINDINGS Brain: No acute intracranial hemorrhage. No midline shift or mass effect. Gray-white differentiation maintained. Unremarkable appearance of the ventricular system. Vascular: Unremarkable. Skull: No acute fracture.  No aggressive bone lesion identified. Sinuses/Orbits: Unremarkable appearance of the orbits. Mastoid air cells clear. No middle ear effusion. No significant sinus disease. Other: Endodontal disease with dental amalgams and apical lucencies of the right maxillary molar. CT CERVICAL SPINE FINDINGS Alignment: No subluxation. No anterolisthesis or retrolisthesis. Craniocervical junction aligned. Skull base and vertebrae: No skull base fracture. Craniocervical junction aligned. No acute displaced fracture of the cervical vertebral bodies. Soft tissues and spinal canal: Unremarkable appearance of the soft tissues. Minimal vascular calcifications. Disc levels: Mild degenerative changes of the discs with endplate sclerosis, mild anterior osteophyte production. Disc osteophyte complex at C4-C5, with left uncovertebral joint disease and no significant foraminal narrowing. No bony canal narrowing. Upper chest: Cardiac pacing leads incompletely imaged. Other: None IMPRESSION: Head CT: Negative for acute intracranial abnormality. Cervical spine: No acute fracture or malalignment of the cervical spine. Electronically Signed   By: Gilmer Mor D.O.   On: 12/02/2018 09:05   Ct Cervical Spine Wo Contrast  Result Date: 12/02/2018 CLINICAL DATA:  63 year old male with a history fall EXAM: CT HEAD WITHOUT CONTRAST CT CERVICAL SPINE WITHOUT CONTRAST TECHNIQUE: Multidetector CT imaging of the head and cervical spine was performed following the standard protocol without intravenous contrast. Multiplanar CT image reconstructions of the cervical spine were also  generated. COMPARISON:  None. FINDINGS: CT HEAD FINDINGS Brain: No acute intracranial hemorrhage. No midline shift or mass effect. Gray-white differentiation maintained. Unremarkable appearance of the ventricular system. Vascular: Unremarkable. Skull: No acute fracture.  No aggressive bone lesion identified. Sinuses/Orbits: Unremarkable appearance of the orbits. Mastoid air cells clear. No middle ear effusion. No significant sinus disease. Other: Endodontal disease with dental amalgams and apical lucencies of the right maxillary molar. CT CERVICAL SPINE FINDINGS Alignment: No subluxation. No anterolisthesis or retrolisthesis. Craniocervical junction aligned. Skull base and vertebrae: No skull base fracture. Craniocervical junction aligned. No acute displaced fracture of the cervical vertebral bodies. Soft tissues and spinal canal: Unremarkable appearance of the soft tissues. Minimal vascular calcifications. Disc levels: Mild degenerative changes of the discs with endplate sclerosis, mild anterior osteophyte production. Disc osteophyte complex at C4-C5, with left uncovertebral joint disease and no significant foraminal narrowing. No bony canal narrowing. Upper chest: Cardiac pacing leads incompletely imaged. Other: None IMPRESSION: Head CT: Negative for acute intracranial abnormality. Cervical spine: No acute fracture or malalignment of the cervical spine. Electronically Signed   By: Gilmer Mor D.O.   On: 12/02/2018 09:05    Procedures Procedures (including critical care time)  Medications Ordered in ED Medications - No data to display   Initial Impression / Assessment and Plan / ED Course  I have reviewed the triage vital signs and the nursing notes.  Pertinent labs & imaging results that were available during my care of the patient were reviewed by me  and considered in my medical decision making (see chart for details).       63 year old male presenting today with altered mental status and a  fall from ground-level.  Patient has a mild contusion over the left forehead but no other obvious signs of injury.  He is currently in a c-collar but denies any neck pain.  Patient is intermittently lucid but then also talks about things that do not make any sense.  Was reported that he took 3 Ambien last night to sleep.  He also takes lithium.  Patient has no prior history of dementia, stroke or other intracranial issues.  He does have a history of bipolar disease.  There was no report of any recent illness and patient denies any shortness of breath abdominal or chest pain.  Low concern for CHF, PE or ACS at this time.  Pt is not hypoxic and sating 97% on RA.  Symptoms may be related to Ambien vs ICH, stroke, toxic ingestion however will check a CBC, CMP, ammonia, salicylate, acetaminophen, lithium level.  CT of the head and neck pending as do not feel pt is reliable historian at this time.  Patient's EKG without acute findings but will interrogate pacemaker to ensure that was not the cause of his fall.  9:54 AM CT of head and C-spine is negative, CBC CMP, acetaminophen, salicylate, lithium all without acute findings.  Patient tried to get out of bed and fell against the wall and was sitting on the floor when I went into evaluate him.  He is more awake than he was but is still not steady on his feet.  Patient is able to move everything and has no focal deficits.  Low suspicion for stroke and feel this is most likely medication related.  However we will continue to monitor patient until he is more lucid and unsteady on his feet.  medtronic pacer showed no events.  1:44 PM Labs are all wnl. Pt slept for some time but now feeling better.  More coherent and can't remember why he was brought here.  Will have pt eat and attempt to ambulate.  2:21 PM Pt is now at his baseline and states does not remember any of the events from this morning.  Pt states he has been really tired and took only 1 ambien last night but  states when he took one on Sunday it made him very sleepy.  Pt able to ambulate here without any problems independently.  Will d/c home.  Final Clinical Impressions(s) / ED Diagnoses   Final diagnoses:  Fall, initial encounter  Confusion  Non-dose-related adverse effect of medication, initial encounter  Contusion of face, initial encounter    ED Discharge Orders    None       Gwyneth Sprout, MD 12/02/18 1446

## 2018-12-02 NOTE — Discharge Instructions (Signed)
Avoid taking any more ambien.  If you start having more confusion, severe headache, vomiting, fever or trouble breathing return.  They had CAT scan of your head that showed no bleeding or broken bones.  You do have some bruising which will get better with time.  Your lithium level today was normal as well as your kidney function and electrolytes.

## 2018-12-10 ENCOUNTER — Other Ambulatory Visit: Payer: Self-pay

## 2018-12-10 ENCOUNTER — Ambulatory Visit (INDEPENDENT_AMBULATORY_CARE_PROVIDER_SITE_OTHER): Payer: BLUE CROSS/BLUE SHIELD | Admitting: Family Medicine

## 2018-12-10 ENCOUNTER — Encounter (INDEPENDENT_AMBULATORY_CARE_PROVIDER_SITE_OTHER): Payer: Self-pay | Admitting: Family Medicine

## 2018-12-10 DIAGNOSIS — Z6839 Body mass index (BMI) 39.0-39.9, adult: Secondary | ICD-10-CM | POA: Diagnosis not present

## 2018-12-10 DIAGNOSIS — R7303 Prediabetes: Secondary | ICD-10-CM

## 2018-12-10 DIAGNOSIS — S0012XD Contusion of left eyelid and periocular area, subsequent encounter: Secondary | ICD-10-CM

## 2018-12-14 ENCOUNTER — Encounter: Payer: BLUE CROSS/BLUE SHIELD | Admitting: *Deleted

## 2018-12-14 ENCOUNTER — Other Ambulatory Visit: Payer: Self-pay

## 2018-12-14 NOTE — Progress Notes (Signed)
Office: [phone]  /  Fax: [phone] TeleHealth Visit:  Sean Winters has verbally consented to this TeleHealth visit today. The patient is located at home, the provider is located at the [facility] office. The participants in this visit include the listed provider and patient and provider's assistant. Sean Winters was unable to use realtime audiovisual technology today and the telehealth visit was conducted via telephone.   HPI:   Chief Complaint: OBESITY Sean Winters is here to discuss his progress with his obesity treatment plan. He is on the Category 3 plan and is following his eating plan approximately 80 % of the time. He states he is walking the dog for 20-40 minutes daily. Webex failed so visit was changed to phone. Sean Winters states he has done well with maintaining his weight. He is struggling to find his food due to grocery shortages. He is doing well with portion control, but noticing some sabotage from his wife.  We were unable to weigh the patient today for this TeleHealth visit. He feels as if he has maintained his weight since his last visit. He has lost 8 lbs since starting treatment with us.  Pre-Diabetes Sean Winters has a diagnosis of pre-diabetes based on his elevated Hgb A1c and was informed this puts him at greater risk of developing diabetes. He is stable on metformin and denies nausea, vomiting, or hypoglycemia. He is doing well with diet and working on weight loss to decrease risk of diabetes.   Contusion Sean Winters had altered mental states after a fall. He was evaluated in the emergency department cardiac or vascular causes were ruled out, and Toxicology was normal. He feels it was due to a loss of balance. He stopped his Ambien.   ASSESSMENT AND PLAN:  Prediabetes  Contusion of left periocular region, subsequent encounter  Class 2 severe obesity with serious comorbidity and body mass index (BMI) of 39.0 to 39.9 in adult, unspecified obesity type (HCC)   PLAN:  Pre-Diabetes Sean Winters will continue to work on weight loss, exercise, and decreasing simple carbohydrates in his diet to help decrease the risk of diabetes. We dicussed metformin including benefits and risks. He was informed that eating too many simple carbohydrates or too many calories at one sitting increases the likelihood of GI side effects. Sean Winters agrees to continue metformin and continue to work on diet. Sean Winters agrees to follow up with our clinic in 3 weeks as directed to monitor his progress.  Contusion Sean Winters is to work on care strategies to help avoid further falls and I agrees that he should stop Ambien completely. Sean Winters agrees to follow up with our clinic in 3 weeks.  I spent > than 50% of the 25 minute visit on counseling as documented in the note.  Obesity Sean Winters is currently in the action stage of change. As such, his goal is to continue with weight loss efforts He has agreed to follow the Category 3 plan Sean Winters has been instructed to work up to a goal of 150 minutes of combined cardio and strengthening exercise per week or start balance exercising in a safe environment for weight loss and overall health benefits. We discussed the following Behavioral Modification Strategies today: work on meal planning and easy cooking plans, dealing with family or coworker sabotage, ways to avoid boredom eating, and better snacking choices   Sean Winters has agreed to follow up with our clinic in 3 weeks. He was informed of the importance of frequent follow up visits to maximize his success with intensive  lifestyle modifications for his multiple health conditions.  ALLERGIES: Allergies  Allergen Reactions  . Haloperidol Other (See Comments)    Fatigue  . Ibuprofen Other (See Comments)    Cannot take while on lithium  . Sulfa Antibiotics Rash  . Sulfamethoxazole Rash    MEDICATIONS: Current Outpatient Medications on File Prior to Visit  Medication Sig Dispense Refill  .  ASHWAGANDHA PO Take 250 mg by mouth once.    [name] aspirin EC 81 MG tablet Take 81 mg by mouth daily.    [name] atorvastatin (LIPITOR) 20 MG tablet Take 20 mg by mouth Daily.     . furosemide (LASIX) 40 MG tablet Take 2 tablets (80 mg total) by mouth daily. 60 tablet 6  . lamoTRIgine (LAMICTAL) 25 MG tablet Take 75 mg by mouth daily.  4  . lithium carbonate (LITHOBID) 300 MG CR tablet Take 900 mg by mouth at bedtime.  4  . metFORMIN (GLUCOPHAGE) 500 MG tablet Take 1 tablet (500 mg total) by mouth 2 (two) times daily with a meal. 60 tablet 0  . OVER THE COUNTER MEDICATION Take 3 capsules by mouth daily. CocoaVia- for memory and blood flow    . OVER THE COUNTER MEDICATION Take 2 tablets by mouth daily. For memory - Sensoril 250 mg tablets    . oxybutynin (DITROPAN-XL) 10 MG 24 hr tablet Take 10 mg by mouth daily.    . potassium chloride SA (K-DUR,KLOR-CON) 20 MEQ tablet Take 2 tablets (40 mEq total) by mouth daily. 180 tablet 3  . pramipexole (MIRAPEX) 1.5 MG tablet Take 1.5 mg by mouth at bedtime.     . Vitamin D, Ergocalciferol, (DRISDOL) 1.25 MG (50000 UT) CAPS capsule Take 1 capsule (50,000 Units total) by mouth every 7 (seven) days. 4 capsule 0  . zolpidem (AMBIEN) 10 MG tablet Take 10 mg by mouth at bedtime.      No current facility-administered medications on file prior to visit.     PAST MEDICAL HISTORY: Past Medical History:  Diagnosis Date  . Anxiety   . Bipolar disorder (HCC)   . BPH (benign prostatic hypertrophy)   . Chest pain   . Complete heart block (HCC)    a. s/p Medtronic PPM 2017  . Cystitis, acute   . Depression   . Diverticulitis   . Dyslipidemia   . Dyspnea   . Edema   . Lumbago   . Nephrolithiasis   . Pulmonary embolism (HCC) 10/2009    PAST SURGICAL HISTORY: Past Surgical History:  Procedure Laterality Date  . EP IMPLANTABLE DEVICE N/A 12/04/2015   Procedure: Pacemaker Implant;  Surgeon: Marinus Maw, MD;  Location: [facility] INVASIVE CV LAB;  Service: Cardiovascular;   Laterality: N/A;  . FINGER FRACTURE SURGERY  2004  . KIDNEY STONES     REMOVAL   . KNEE ARTHROSCOPY     RIGHT KNEE  . TONSILLECTOMY      SOCIAL HISTORY: Social History   Tobacco Use  . Smoking status: Former Smoker    Packs/day: 1.00    Years: 18.00    Pack years: 18.00    Types: Cigarettes    Last attempt to quit: 09/03/1987    Years since quitting: 31.3  . Smokeless tobacco: Never Used  Substance Use Topics  . Alcohol use: No  . Drug use: No    FAMILY HISTORY: Family History  Problem Relation Age of Onset  . Heart attack Father 42  . Hyperlipidemia Father   . Diabetes Father   .  Hypertension Father   . Heart disease Father   . Fibromyalgia Sister   . Stroke Mother   . Arrhythmia Mother        atrial fib.  . Depression Mother   . Anxiety disorder Mother   . Bipolar disorder Mother     ROS: Review of Systems  Constitutional: Negative for weight loss.  Gastrointestinal: Negative for nausea and vomiting.  Neurological:       + Fall  Endo/Heme/Allergies:       Negative hypoglycemia    PHYSICAL EXAM: Pt in no acute distress  RECENT LABS AND TESTS: BMET    Component Value Date/Time   NA 140 12/02/2018 0809   NA 142 09/15/2018 0758   K 4.2 12/02/2018 0809   CL 105 12/02/2018 0809   CO2 27 12/02/2018 0809   GLUCOSE 99 12/02/2018 0809   BUN 17 12/02/2018 0809   BUN 21 09/15/2018 0758   CREATININE 1.36 (H) 12/02/2018 0809   CALCIUM 8.8 (L) 12/02/2018 0809   GFRNONAA 55 (L) 12/02/2018 0809   GFRAA >60 12/02/2018 0809   Lab Results  Component Value Date   HGBA1C 5.9 (H) 09/15/2018   HGBA1C 5.9 (H) 04/16/2018   HGBA1C 5.8 (H) 12/02/2015   HGBA1C 5.4 03/14/2012   Lab Results  Component Value Date   INSULIN 11.3 09/15/2018   INSULIN 15.2 04/16/2018   CBC    Component Value Date/Time   WBC 11.8 (H) 12/02/2018 0809   RBC 4.76 12/02/2018 0809   HGB 13.1 12/02/2018 0809   HGB 14.0 04/16/2018 0943   HCT 43.0 12/02/2018 0809   HCT 42.5 04/16/2018  0943   PLT 276 12/02/2018 0809   MCV 90.3 12/02/2018 0809   MCV 89 04/16/2018 0943   MCH 27.5 12/02/2018 0809   MCHC 30.5 12/02/2018 0809   RDW 13.6 12/02/2018 0809   RDW 14.4 04/16/2018 0943   LYMPHSABS 1.1 12/02/2018 0809   LYMPHSABS 1.8 04/16/2018 0943   MONOABS 0.8 12/02/2018 0809   EOSABS 0.1 12/02/2018 0809   EOSABS 0.2 04/16/2018 0943   BASOSABS 0.1 12/02/2018 0809   BASOSABS 0.1 04/16/2018 0943   Iron/TIBC/Ferritin/ %Sat No results found for: IRON, TIBC, FERRITIN, IRONPCTSAT Lipid Panel     Component Value Date/Time   CHOL 155 09/15/2018 0758   TRIG 86 09/15/2018 0758   HDL 39 (L) 09/15/2018 0758   CHOLHDL 3.8 12/02/2015 0212   VLDL 13 12/02/2015 0212   LDLCALC 99 09/15/2018 0758   Hepatic Function Panel     Component Value Date/Time   PROT 6.5 12/02/2018 0809   PROT 6.6 09/15/2018 0758   ALBUMIN 3.5 12/02/2018 0809   ALBUMIN 4.3 09/15/2018 0758   AST 14 (L) 12/02/2018 0809   ALT 11 12/02/2018 0809   ALKPHOS 105 12/02/2018 0809   BILITOT 0.7 12/02/2018 0809   BILITOT 0.6 09/15/2018 0758       I, Burt Knack, am acting as transcriptionist for Quillian Quince, MD I have reviewed the above documentation for accuracy and completeness, and I agree with the above. -Quillian Quince, MD

## 2018-12-15 ENCOUNTER — Telehealth: Payer: Self-pay

## 2018-12-15 NOTE — Telephone Encounter (Signed)
Left message for patient to remind of missed remote transmission.  

## 2018-12-28 ENCOUNTER — Other Ambulatory Visit (INDEPENDENT_AMBULATORY_CARE_PROVIDER_SITE_OTHER): Payer: Self-pay | Admitting: Family Medicine

## 2018-12-28 DIAGNOSIS — E559 Vitamin D deficiency, unspecified: Secondary | ICD-10-CM

## 2018-12-30 ENCOUNTER — Other Ambulatory Visit (INDEPENDENT_AMBULATORY_CARE_PROVIDER_SITE_OTHER): Payer: Self-pay | Admitting: Family Medicine

## 2018-12-30 DIAGNOSIS — R7303 Prediabetes: Secondary | ICD-10-CM

## 2018-12-31 ENCOUNTER — Ambulatory Visit (INDEPENDENT_AMBULATORY_CARE_PROVIDER_SITE_OTHER): Payer: BLUE CROSS/BLUE SHIELD | Admitting: Family Medicine

## 2018-12-31 ENCOUNTER — Other Ambulatory Visit: Payer: Self-pay | Admitting: Internal Medicine

## 2018-12-31 ENCOUNTER — Encounter (INDEPENDENT_AMBULATORY_CARE_PROVIDER_SITE_OTHER): Payer: Self-pay | Admitting: Family Medicine

## 2018-12-31 ENCOUNTER — Other Ambulatory Visit: Payer: Self-pay

## 2018-12-31 DIAGNOSIS — R7303 Prediabetes: Secondary | ICD-10-CM

## 2018-12-31 DIAGNOSIS — E669 Obesity, unspecified: Secondary | ICD-10-CM

## 2018-12-31 DIAGNOSIS — E559 Vitamin D deficiency, unspecified: Secondary | ICD-10-CM | POA: Diagnosis not present

## 2018-12-31 DIAGNOSIS — Z6834 Body mass index (BMI) 34.0-34.9, adult: Secondary | ICD-10-CM

## 2018-12-31 MED ORDER — METFORMIN HCL 500 MG PO TABS: 500.0000 mg | ORAL_TABLET | Freq: Two times a day (BID) | ORAL | 0 refills | Status: DC

## 2018-12-31 MED ORDER — VITAMIN D (ERGOCALCIFEROL) 1.25 MG (50000 UNIT) PO CAPS: 50000.0000 [IU] | ORAL_CAPSULE | ORAL | 0 refills | Status: DC

## 2018-12-31 NOTE — Progress Notes (Signed)
Office: [phone]  /  Fax: [phone] TeleHealth Visit:  Sean Winters has verbally consented to this TeleHealth visit today. The patient is located at work, the provider is located at the [facility] office. The participants in this visit include the listed provider and patient. Sean Winters was unable to use doxy.me. today and the telehealth visit was conducted via telephone.  HPI:   Chief Complaint: OBESITY Sean Winters is here to discuss his progress with his obesity treatment plan. He is on the Category 3 plan and is following his eating plan approximately 80 % of the time. He states he is walking the dog 20 minutes 6 times per week. Sean Winters feels that he is deviating from his plan more and suspects that he has started to gain weight. He especially struggles with dinner and would like to look at other options.  We were unable to weigh the patient today for this TeleHealth visit. He feels as if he may have gained weight since his last visit. He has lost 8 lbs since starting treatment with [place].  Vitamin D Deficiency Sean Winters has a diagnosis of vitamin D deficiency. He is currently stable on vit D, but is not yet at goal. Sean Winters denies nausea, vomiting, or muscle weakness.  Pre-Diabetes Sean Winters has a diagnosis of pre-diabetes based on his elevated Hgb A1c and was informed this puts him at greater risk of developing diabetes. Sean Winters is struggling with afternoon snacking on carbs and his weight has started to increase. He is exercising and taking his metformin. He continues to work on diet and exercise to decrease risk of diabetes. He denies nausea, vomiting, or hypoglycemia.   ASSESSMENT AND PLAN:  Vitamin D deficiency - Plan: Vitamin D, Ergocalciferol, (DRISDOL) 1.25 MG (50000 UT) CAPS capsule  Prediabetes - Plan: metFORMIN (GLUCOPHAGE) 500 MG tablet  Class 1 obesity with serious comorbidity and body mass index (BMI) of 34.0 to 34.9 in adult, unspecified obesity type   PLAN:  Vitamin D Deficiency Sean Winters was informed that low vitamin D levels contribute to fatigue and are associated with obesity, breast, and colon cancer. Sean Winters agrees to continue to take prescription Vit D @50 ,000 IU every week #4 with no refills and will follow up for routine testing of vitamin D, at least 2-3 times per year. He was informed of the risk of over-replacement of vitamin D and agrees to not increase his dose unless he discusses this with [place] first. Sean Winters agrees to follow up in 2 weeks as directed.  Pre-Diabetes Sean Winters will continue to work on weight loss, exercise, and decreasing simple carbohydrates in his diet to help decrease the risk of diabetes. He was informed that eating too many simple carbohydrates or too many calories at one sitting increases the likelihood of GI side effects. Sean Winters agreed to continue metformin 500 mg BID #60 with no refills and a prescription was written today. Sean Winters agreed to follow up with [place] as directed to monitor his progress in 2 weeks.   Obesity Sean Winters is currently in the action stage of change. As such, his goal is to continue with weight loss efforts. He has agreed to follow the Category 3 plan and keep a food journal with 450 to 600 calories and 40+ grams of protein for supper.  Sean Winters has been instructed to work up to a goal of 150 minutes of combined cardio and strengthening exercise per week for weight loss and overall health benefits. We discussed the following Behavioral Modification Strategies today: better snacking choices,  ways to avoid boredom eating, and ways to avoid night time snacking.  Sean Winters has agreed to follow up with our clinic in 2 weeks. He was informed of the importance of frequent follow up visits to maximize his success with intensive lifestyle modifications for his multiple health conditions.  ALLERGIES: Allergies  Allergen Reactions  . Haloperidol Other (See Comments)    Fatigue  . Ibuprofen Other (See  Comments)    Cannot take while on lithium  . Sulfa Antibiotics Rash  . Sulfamethoxazole Rash    MEDICATIONS: Current Outpatient Medications on File Prior to Visit  Medication Sig Dispense Refill  . ASHWAGANDHA PO Take 250 mg by mouth once.    [name]. aspirin EC 81 MG tablet Take 81 mg by mouth daily.    [name]. atorvastatin (LIPITOR) 20 MG tablet Take 20 mg by mouth Daily.     . furosemide (LASIX) 40 MG tablet Take 2 tablets (80 mg total) by mouth daily. 60 tablet 6  . lamoTRIgine (LAMICTAL) 25 MG tablet Take 75 mg by mouth daily.  4  . lithium carbonate (LITHOBID) 300 MG CR tablet Take 900 mg by mouth at bedtime.  4  . OVER THE COUNTER MEDICATION Take 3 capsules by mouth daily. CocoaVia- for memory and blood flow    . OVER THE COUNTER MEDICATION Take 2 tablets by mouth daily. For memory - Sensoril 250 mg tablets    . oxybutynin (DITROPAN-XL) 10 MG 24 hr tablet Take 10 mg by mouth daily.    . potassium chloride SA (K-DUR,KLOR-CON) 20 MEQ tablet Take 2 tablets (40 mEq total) by mouth daily. 180 tablet 3  . pramipexole (MIRAPEX) 1.5 MG tablet Take 1.5 mg by mouth at bedtime.     [name]. zolpidem (AMBIEN) 10 MG tablet Take 10 mg by mouth at bedtime.      No current facility-administered medications on file prior to visit.     PAST MEDICAL HISTORY: Past Medical History:  Diagnosis Date  . Anxiety   . Bipolar disorder (HCC)   . BPH (benign prostatic hypertrophy)   . Chest pain   . Complete heart block (HCC)    a. s/p Medtronic PPM 2017  . Cystitis, acute   . Depression   . Diverticulitis   . Dyslipidemia   . Dyspnea   . Edema   . Lumbago   . Nephrolithiasis   . Pulmonary embolism (HCC) 10/2009    PAST SURGICAL HISTORY: Past Surgical History:  Procedure Laterality Date  . EP IMPLANTABLE DEVICE N/A 12/04/2015   Procedure: Pacemaker Implant;  Surgeon: Marinus MawGregg W Taylor, MD;  [facility]VAS[facility] CV LAB;  Service: Cardiovascular;  Laterality: N/A;  . FINGER FRACTURE SURGERY  2004  . KIDNEY STONES      REMOVAL   . KNEE ARTHROSCOPY     RIGHT KNEE  . TONSILLECTOMY      SOCIAL HISTORY: Social History   Tobacco Use  . Smoking status: Former Smoker    Packs/day: 1.00    Years: 18.00    Pack years: 18.00    Types: Cigarettes    Last attempt to quit: 09/03/1987    Years since quitting: 31.3  . Smokeless tobacco: Never Used  Substance Use Topics  . Alcohol use: No  . Drug use: No    FAMILY HISTORY: Family History  Problem Relation Age of Onset  . Heart attack Father [id number]  . Hyperlipidemia Father   . Diabetes Father   . Hypertension Father   . Heart disease  Father   . Fibromyalgia Sister   . Stroke Mother   . Arrhythmia Mother        atrial fib.  . Depression Mother   . Anxiety disorder Mother   . Bipolar disorder Mother     ROS: Review of Systems  Gastrointestinal: Negative for nausea and vomiting.  Musculoskeletal:       Negative for muscle weakness.  Endo/Heme/Allergies:       Negative for hypoglycemia.    PHYSICAL EXAM: Pt in no acute distress  RECENT LABS AND TESTS: BMET    Component Value Date/Time   NA 140 12/02/2018 0809   NA 142 09/15/2018 0758   K 4.2 12/02/2018 0809   CL 105 12/02/2018 0809   CO2 27 12/02/2018 0809   GLUCOSE 99 12/02/2018 0809   BUN 17 12/02/2018 0809   BUN 21 09/15/2018 0758   CREATININE 1.36 (H) 12/02/2018 0809   CALCIUM 8.8 (L) 12/02/2018 0809   GFRNONAA 55 (L) 12/02/2018 0809   GFRAA >60 12/02/2018 0809   Lab Results  Component Value Date   HGBA1C 5.9 (H) 09/15/2018   HGBA1C 5.9 (H) 04/16/2018   HGBA1C 5.8 (H) 12/02/2015   HGBA1C 5.4 03/14/2012   Lab Results  Component Value Date   INSULIN 11.3 09/15/2018   INSULIN 15.2 04/16/2018   CBC    Component Value Date/Time   WBC 11.8 (H) 12/02/2018 0809   RBC 4.76 12/02/2018 0809   HGB 13.1 12/02/2018 0809   HGB 14.0 04/16/2018 0943   HCT 43.0 12/02/2018 0809   HCT 42.5 04/16/2018 0943   PLT 276 12/02/2018 0809   MCV 90.3 12/02/2018 0809   MCV 89 04/16/2018  0943   MCH 27.5 12/02/2018 0809   MCHC 30.5 12/02/2018 0809   RDW 13.6 12/02/2018 0809   RDW 14.4 04/16/2018 0943   LYMPHSABS 1.1 12/02/2018 0809   LYMPHSABS 1.8 04/16/2018 0943   MONOABS 0.8 12/02/2018 0809   EOSABS 0.1 12/02/2018 0809   EOSABS 0.2 04/16/2018 0943   BASOSABS 0.1 12/02/2018 0809   BASOSABS 0.1 04/16/2018 0943   Iron/TIBC/Ferritin/ %Sat No results found for: IRON, TIBC, FERRITIN, IRONPCTSAT Lipid Panel     Component Value Date/Time   CHOL 155 09/15/2018 0758   TRIG 86 09/15/2018 0758   HDL 39 (L) 09/15/2018 0758   CHOLHDL 3.8 12/02/2015 0212   VLDL 13 12/02/2015 0212   LDLCALC 99 09/15/2018 0758   Hepatic Function Panel     Component Value Date/Time   PROT 6.5 12/02/2018 0809   PROT 6.6 09/15/2018 0758   ALBUMIN 3.5 12/02/2018 0809   ALBUMIN 4.3 09/15/2018 0758   AST 14 (L) 12/02/2018 0809   ALT 11 12/02/2018 0809   ALKPHOS 105 12/02/2018 0809   BILITOT 0.7 12/02/2018 0809   BILITOT 0.6 09/15/2018 0758      Component Value Date/Time   TSH 1.170 04/16/2018 0943   TSH 4.063 12/02/2015 0212   TSH 1.188    Test methodology is 3rd generation TSH  10/23/2009 0530   Results for DIANA, ARMIJO (MRN [id number]) as of 12/31/2018 12:10  Ref. Range 09/15/2018 07:58  Vitamin D, 25-Hydroxy Latest Ref Range: 30.0 - 100.0 ng/mL 39.9     I, Kirke Corin, CMA, am acting as transcriptionist for Wilder Glade, MD I have reviewed the above documentation for accuracy and completeness, and I agree with the above. -Quillian Quince, MD

## 2019-01-07 ENCOUNTER — Other Ambulatory Visit: Payer: Self-pay

## 2019-01-07 ENCOUNTER — Ambulatory Visit (INDEPENDENT_AMBULATORY_CARE_PROVIDER_SITE_OTHER): Payer: BLUE CROSS/BLUE SHIELD | Admitting: *Deleted

## 2019-01-07 DIAGNOSIS — I442 Atrioventricular block, complete: Secondary | ICD-10-CM | POA: Diagnosis not present

## 2019-01-07 LAB — CUP PACEART REMOTE DEVICE CHECK
Battery Remaining Longevity: 91 mo
Battery Voltage: 3.02 V
Brady Statistic AP VP Percent: 3.74 %
Brady Statistic AP VS Percent: 0.89 %
Brady Statistic AS VP Percent: 0.24 %
Brady Statistic AS VS Percent: 95.14 %
Brady Statistic RA Percent Paced: 4.62 %
Brady Statistic RV Percent Paced: 4.01 %
Date Time Interrogation Session: 20200506234402
Implantable Lead Implant Date: 20170403
Implantable Lead Implant Date: 20170403
Implantable Lead Location: 753859
Implantable Lead Location: 753860
Implantable Lead Model: 5076
Implantable Lead Model: 5076
Implantable Pulse Generator Implant Date: 20170403
Lead Channel Impedance Value: 361 Ohm
Lead Channel Impedance Value: 399 Ohm
Lead Channel Impedance Value: 456 Ohm
Lead Channel Impedance Value: 494 Ohm
Lead Channel Pacing Threshold Amplitude: 0.75 V
Lead Channel Pacing Threshold Amplitude: 1.25 V
Lead Channel Pacing Threshold Pulse Width: 0.4 ms
Lead Channel Pacing Threshold Pulse Width: 0.4 ms
Lead Channel Sensing Intrinsic Amplitude: 13.125 mV
Lead Channel Sensing Intrinsic Amplitude: 13.125 mV
Lead Channel Sensing Intrinsic Amplitude: 4 mV
Lead Channel Sensing Intrinsic Amplitude: 4 mV
Lead Channel Setting Pacing Amplitude: 2 V
Lead Channel Setting Pacing Amplitude: 2.75 V
Lead Channel Setting Pacing Pulse Width: 0.4 ms
Lead Channel Setting Sensing Sensitivity: 2 mV

## 2019-01-12 ENCOUNTER — Encounter: Payer: Self-pay | Admitting: Cardiology

## 2019-01-12 NOTE — Progress Notes (Signed)
Remote pacemaker transmission.   

## 2019-01-14 ENCOUNTER — Ambulatory Visit (INDEPENDENT_AMBULATORY_CARE_PROVIDER_SITE_OTHER): Payer: Self-pay | Admitting: Family Medicine

## 2019-01-28 ENCOUNTER — Encounter (INDEPENDENT_AMBULATORY_CARE_PROVIDER_SITE_OTHER): Payer: Self-pay | Admitting: Family Medicine

## 2019-01-28 ENCOUNTER — Ambulatory Visit (INDEPENDENT_AMBULATORY_CARE_PROVIDER_SITE_OTHER): Payer: BLUE CROSS/BLUE SHIELD | Admitting: Family Medicine

## 2019-01-28 ENCOUNTER — Other Ambulatory Visit: Payer: Self-pay

## 2019-01-28 DIAGNOSIS — R7303 Prediabetes: Secondary | ICD-10-CM

## 2019-01-28 DIAGNOSIS — Z6839 Body mass index (BMI) 39.0-39.9, adult: Secondary | ICD-10-CM | POA: Diagnosis not present

## 2019-01-28 DIAGNOSIS — E559 Vitamin D deficiency, unspecified: Secondary | ICD-10-CM

## 2019-01-28 MED ORDER — METFORMIN HCL 500 MG PO TABS: 500.0000 mg | ORAL_TABLET | Freq: Two times a day (BID) | ORAL | 0 refills | Status: DC

## 2019-01-28 MED ORDER — VITAMIN D (ERGOCALCIFEROL) 1.25 MG (50000 UNIT) PO CAPS: 50000.0000 [IU] | ORAL_CAPSULE | ORAL | 0 refills | Status: DC

## 2019-01-31 NOTE — Progress Notes (Signed)
Office: [phone]  /  Fax: [phone] TeleHealth Visit:  Sean Winters has verbally consented to this TeleHealth visit today. The patient is located at home, the provider is located at the [employer] [facility] office. The participants in this visit include the listed provider and patient. The visit was conducted today via [email or web address]  HPI:   Chief Complaint: OBESITY Sean Winters is here to discuss his progress with his obesity treatment plan. He is on the keep a food journal with 450-600 calories and 40+ grams of protein at supper daily and follow the Category 3 plan and is following his eating plan approximately 20 % of the time. He states he is walking the dog for 30 minutes 7 times per week. Sean Winters has been struggling to follow his plan. He has not been eating all his protein, meeting only half his goal. He has increased carbohydrates and evening snacking. He thinks he has gained weight, but states he is ready to get back on track.  We were unable to weigh the patient today for this TeleHealth visit. He feels as if he has gained weight since his last visit. He has lost 8 lbs since starting treatment with [place].  Pre-Diabetes Sean Winters has a diagnosis of pre-diabetes based on his elevated Hgb A1c and was informed this puts him at greater risk of developing diabetes. He has deviated from his plan more in the last month, but is doing well on metformin. He denies nausea, vomiting, or hypoglycemia. He continues to work on diet and exercise to decrease risk of diabetes.  Vitamin D Deficiency Sean Winters has a diagnosis of vitamin D deficiency. He is currently taking prescription Vit D, but level is not yet at goal. He denies nausea, vomiting or muscle weakness.  ASSESSMENT AND PLAN:  Prediabetes - Plan: metFORMIN (GLUCOPHAGE) 500 MG tablet  Vitamin D deficiency - Plan: Vitamin D, Ergocalciferol, (DRISDOL) 1.25 MG (50000 UT) CAPS capsule  Class 2 severe obesity with serious comorbidity and body  mass index (BMI) of 39.0 to 39.9 in adult, unspecified obesity type [facility])  PLAN:  Pre-Diabetes Sean Winters is to get back to his diet prescription. He will continue to work on weight loss, exercise, and decreasing simple carbohydrates in his diet to help decrease the risk of diabetes. We dicussed metformin including benefits and risks. He was informed that eating too many simple carbohydrates or too many calories at one sitting increases the likelihood of GI side effects. Sean Winters agrees to continue taking metformin 500 mg BID #60 and we will refill for 1 month. Sean Winters agrees to follow up with our clinic in 2 weeks as directed to monitor his progress.  Vitamin D Deficiency Sean Winters was informed that low vitamin D levels contributes to fatigue and are associated with obesity, breast, and colon cancer. Sean Winters agrees to continue taking prescription Vit D @50 ,000 IU every week #4 and we will refill for 1 month. He will follow up for routine testing of vitamin D, at least 2-3 times per year. He was informed of the risk of over-replacement of vitamin D and agrees to not increase his dose unless he discusses this with [place] first. Sean Winters agrees to follow up with our clinic in 2 weeks.  Obesity Sean Winters is currently in the action stage of change. As such, his goal is to continue with weight loss efforts He has agreed to follow the Category 3 plan Sean Winters has been instructed to work up to a goal of 150 minutes of combined cardio and strengthening exercise  per week for weight loss and overall health benefits. We discussed the following Behavioral Modification Strategies today: increasing lean protein intake, increasing vegetables and dealing with family or coworker sabotage   Sean Winters has agreed to follow up with our clinic in 2 weeks. He was informed of the importance of frequent follow up visits to maximize his success with intensive lifestyle modifications for his multiple health conditions.  ALLERGIES:  Allergies  Allergen Reactions  . Haloperidol Other (See Comments)    Fatigue  . Ibuprofen Other (See Comments)    Cannot take while on lithium  . Sulfa Antibiotics Rash  . Sulfamethoxazole Rash    MEDICATIONS: Current Outpatient Medications on File Prior to Visit  Medication Sig Dispense Refill  . ASHWAGANDHA PO Take 250 mg by mouth once.    [name] aspirin EC 81 MG tablet Take 81 mg by mouth daily.    [name] atorvastatin (LIPITOR) 20 MG tablet Take 20 mg by mouth Daily.     . furosemide (LASIX) 40 MG tablet Take 2 tablets (80 mg total) by mouth daily. 60 tablet 6  . lamoTRIgine (LAMICTAL) 25 MG tablet Take 75 mg by mouth daily.  4  . lithium carbonate (LITHOBID) 300 MG CR tablet Take 900 mg by mouth at bedtime.  4  . OVER THE COUNTER MEDICATION Take 3 capsules by mouth daily. CocoaVia- for memory and blood flow    . OVER THE COUNTER MEDICATION Take 2 tablets by mouth daily. For memory - Sensoril 250 mg tablets    . oxybutynin (DITROPAN-XL) 10 MG 24 hr tablet Take 10 mg by mouth daily.    . potassium chloride SA (K-DUR) 20 MEQ tablet TAKE 2 TABLETS BY MOUTH  DAILY 180 tablet 1  . pramipexole (MIRAPEX) 1.5 MG tablet Take 1.5 mg by mouth at bedtime.     [name] zolpidem (AMBIEN) 10 MG tablet Take 10 mg by mouth at bedtime.      No current facility-administered medications on file prior to visit.     PAST MEDICAL HISTORY: Past Medical History:  Diagnosis Date  . Anxiety   . Bipolar disorder (HCC)   . BPH (benign prostatic hypertrophy)   . Chest pain   . Complete heart block (HCC)    a. s/p Medtronic PPM 2017  . Cystitis, acute   . Depression   . Diverticulitis   . Dyslipidemia   . Dyspnea   . Edema   . Lumbago   . Nephrolithiasis   . Pulmonary embolism (HCC) 10/2009    PAST SURGICAL HISTORY: Past Surgical History:  Procedure Laterality Date  . EP IMPLANTABLE DEVICE N/A 12/04/2015   Procedure: Pacemaker Implant;  Surgeon: Marinus Maw, MD;  Location: [facility] INVASIVE CV LAB;  Service:  Cardiovascular;  Laterality: N/A;  . FINGER FRACTURE SURGERY  2004  . KIDNEY STONES     REMOVAL   . KNEE ARTHROSCOPY     RIGHT KNEE  . TONSILLECTOMY      SOCIAL HISTORY: Social History   Tobacco Use  . Smoking status: Former Smoker    Packs/day: 1.00    Years: 18.00    Pack years: 18.00    Types: Cigarettes    Last attempt to quit: 09/03/1987    Years since quitting: 31.4  . Smokeless tobacco: Never Used  Substance Use Topics  . Alcohol use: No  . Drug use: No    FAMILY HISTORY: Family History  Problem Relation Age of Onset  . Heart attack Father 60  .  Hyperlipidemia Father   . Diabetes Father   . Hypertension Father   . Heart disease Father   . Fibromyalgia Sister   . Stroke Mother   . Arrhythmia Mother        atrial fib.  . Depression Mother   . Anxiety disorder Mother   . Bipolar disorder Mother     ROS: Review of Systems  Constitutional: Negative for weight loss.  Gastrointestinal: Negative for nausea and vomiting.  Musculoskeletal:       Negative muscle weakness  Endo/Heme/Allergies:       Negative hypoglycemia    PHYSICAL EXAM: Pt in no acute distress  RECENT LABS AND TESTS: BMET    Component Value Date/Time   NA 140 12/02/2018 0809   NA 142 09/15/2018 0758   K 4.2 12/02/2018 0809   CL 105 12/02/2018 0809   CO2 27 12/02/2018 0809   GLUCOSE 99 12/02/2018 0809   BUN 17 12/02/2018 0809   BUN 21 09/15/2018 0758   CREATININE 1.36 (H) 12/02/2018 0809   CALCIUM 8.8 (L) 12/02/2018 0809   GFRNONAA 55 (L) 12/02/2018 0809   GFRAA >60 12/02/2018 0809   Lab Results  Component Value Date   HGBA1C 5.9 (H) 09/15/2018   HGBA1C 5.9 (H) 04/16/2018   HGBA1C 5.8 (H) 12/02/2015   HGBA1C 5.4 03/14/2012   Lab Results  Component Value Date   INSULIN 11.3 09/15/2018   INSULIN 15.2 04/16/2018   CBC    Component Value Date/Time   WBC 11.8 (H) 12/02/2018 0809   RBC 4.76 12/02/2018 0809   HGB 13.1 12/02/2018 0809   HGB 14.0 04/16/2018 0943   HCT 43.0  12/02/2018 0809   HCT 42.5 04/16/2018 0943   PLT 276 12/02/2018 0809   MCV 90.3 12/02/2018 0809   MCV 89 04/16/2018 0943   MCH 27.5 12/02/2018 0809   MCHC 30.5 12/02/2018 0809   RDW 13.6 12/02/2018 0809   RDW 14.4 04/16/2018 0943   LYMPHSABS 1.1 12/02/2018 0809   LYMPHSABS 1.8 04/16/2018 0943   MONOABS 0.8 12/02/2018 0809   EOSABS 0.1 12/02/2018 0809   EOSABS 0.2 04/16/2018 0943   BASOSABS 0.1 12/02/2018 0809   BASOSABS 0.1 04/16/2018 0943   Iron/TIBC/Ferritin/ %Sat No results found for: IRON, TIBC, FERRITIN, IRONPCTSAT Lipid Panel     Component Value Date/Time   CHOL 155 09/15/2018 0758   TRIG 86 09/15/2018 0758   HDL 39 (L) 09/15/2018 0758   CHOLHDL 3.8 12/02/2015 0212   VLDL 13 12/02/2015 0212   LDLCALC 99 09/15/2018 0758   Hepatic Function Panel     Component Value Date/Time   PROT 6.5 12/02/2018 0809   PROT 6.6 09/15/2018 0758   ALBUMIN 3.5 12/02/2018 0809   ALBUMIN 4.3 09/15/2018 0758   AST 14 (L) 12/02/2018 0809   ALT 11 12/02/2018 0809   ALKPHOS 105 12/02/2018 0809   BILITOT 0.7 12/02/2018 0809   BILITOT 0.6 09/15/2018 0758       I, Burt KnackSharon Martin, am acting as transcriptionist for Quillian Quincearen Mariama Saintvil, MD I have reviewed the above documentation for accuracy and completeness, and I agree with the above. -Quillian Quincearen Cherisse Carrell, MD

## 2019-02-02 ENCOUNTER — Other Ambulatory Visit (INDEPENDENT_AMBULATORY_CARE_PROVIDER_SITE_OTHER): Payer: Self-pay | Admitting: Family Medicine

## 2019-02-02 DIAGNOSIS — R7303 Prediabetes: Secondary | ICD-10-CM

## 2019-02-15 ENCOUNTER — Ambulatory Visit (INDEPENDENT_AMBULATORY_CARE_PROVIDER_SITE_OTHER): Payer: BC Managed Care – PPO | Admitting: Family Medicine

## 2019-02-15 ENCOUNTER — Encounter (INDEPENDENT_AMBULATORY_CARE_PROVIDER_SITE_OTHER): Payer: Self-pay | Admitting: Family Medicine

## 2019-02-15 ENCOUNTER — Other Ambulatory Visit: Payer: Self-pay

## 2019-02-15 DIAGNOSIS — R7303 Prediabetes: Secondary | ICD-10-CM

## 2019-02-15 DIAGNOSIS — Z6839 Body mass index (BMI) 39.0-39.9, adult: Secondary | ICD-10-CM

## 2019-02-16 NOTE — Progress Notes (Signed)
Office: [phone]  /  Fax: [phone] TeleHealth Visit:  Sean Winters has verbally consented to this TeleHealth visit today. The patient is located at work, the provider is located at the [facility] office. The participants in this visit include the listed provider and patient. The visit was conducted today via [email or web address].  HPI:   Chief Complaint: OBESITY Sean Winters is here to discuss his progress with his obesity treatment plan. He is on the Category 3 plan and is following his eating plan approximately 75 % of the time. He states he is walking 30 minutes 6 to 7 times per week. Sean Winters continues to do well maintaining his weight. He is struggling with meal prepping and eating all his protein and vegetables depending on his schedule. Sean Winters is walking daily and feels well overall.  We were unable to weigh the patient today for this TeleHealth visit. He feels as if he may have lost weight since his last visit. He has lost 8 lbs since starting treatment with us.  Pre-Diabetes Sean Winters has a diagnosis of pre-diabetes based on his elevated Hgb A1c and was informed this puts him at greater risk of developing diabetes. He is doing well on metformin and he found some metformin from over 6 months ago and he has been taking that. He continues to work on diet and exercise to decrease risk of diabetes. He denies nausea, vomiting, or hypoglycemia, but has increased simple sugars.   ASSESSMENT AND PLAN:  Prediabetes  Class 2 severe obesity with serious comorbidity and body mass index (BMI) of 39.0 to 39.9 in adult, unspecified obesity type (HCC)  PLAN:  Pre-Diabetes Sean Winters will continue to work on weight loss, exercise, and decreasing simple carbohydrates in his diet to help decrease the risk of diabetes. He was informed that eating too many simple carbohydrates or too many calories at one sitting increases the likelihood of GI side effects. Sean Winters agreed to continue metformin and  to get back to his diet. We will check labs when it is safe for him to come back into the office. Sean Winters agreed to follow up with us as directed to monitor his progress in 3 weeks.   I spent > than 50% of the 15 minute visit on counseling as documented in the note.  Obesity Sean Winters is currently in the action stage of change. As such, his goal is to continue with weight loss efforts He has agreed to follow the Category 3 plan Sean Winters has been instructed to work up to a goal of 150 minutes of combined cardio and strengthening exercise per week for weight loss and overall health benefits. We discussed the following Behavioral Modification Strategies today: increasing lean protein intake, increasing vegetables, and work on meal planning and easy cooking plans.  Sean Winters has agreed to follow up with our clinic in 3 weeks. He was informed of the importance of frequent follow up visits to maximize his success with intensive lifestyle modifications for his multiple health conditions.  ALLERGIES: Allergies  Allergen Reactions  . Haloperidol Other (See Comments)    Fatigue  . Ibuprofen Other (See Comments)    Cannot take while on lithium  . Sulfa Antibiotics Rash  . Sulfamethoxazole Rash    MEDICATIONS: Current Outpatient Medications on File Prior to Visit  Medication Sig Dispense Refill  . ASHWAGANDHA PO Take 250 mg by mouth once.    [name]. aspirin EC 81 MG tablet Take 81 mg by mouth daily.    [name]. atorvastatin (LIPITOR) 20  MG tablet Take 20 mg by mouth Daily.     . furosemide (LASIX) 40 MG tablet Take 2 tablets (80 mg total) by mouth daily. 60 tablet 6  . lamoTRIgine (LAMICTAL) 25 MG tablet Take 75 mg by mouth daily.  4  . lithium carbonate (LITHOBID) 300 MG CR tablet Take 900 mg by mouth at bedtime.  4  . metFORMIN (GLUCOPHAGE) 500 MG tablet Take 1 tablet (500 mg total) by mouth 2 (two) times daily with a meal. 60 tablet 0  . OVER THE COUNTER MEDICATION Take 3 capsules by mouth daily. CocoaVia-  for memory and blood flow    . OVER THE COUNTER MEDICATION Take 2 tablets by mouth daily. For memory - Sensoril 250 mg tablets    . oxybutynin (DITROPAN-XL) 10 MG 24 hr tablet Take 10 mg by mouth daily.    . potassium chloride SA (K-DUR) 20 MEQ tablet TAKE 2 TABLETS BY MOUTH  DAILY 180 tablet 1  . pramipexole (MIRAPEX) 1.5 MG tablet Take 1.5 mg by mouth at bedtime.     . Vitamin D, Ergocalciferol, (DRISDOL) 1.25 MG (50000 UT) CAPS capsule Take 1 capsule (50,000 Units total) by mouth every 7 (seven) days. 4 capsule 0  . zolpidem (AMBIEN) 10 MG tablet Take 10 mg by mouth at bedtime.      No current facility-administered medications on file prior to visit.     PAST MEDICAL HISTORY: Past Medical History:  Diagnosis Date  . Anxiety   . Bipolar disorder ([place])   . BPH (benign prostatic hypertrophy)   . Chest pain   . Complete heart block ([place])    a. s/p Medtronic PPM 2017  . Cystitis, acute   . Depression   . Diverticulitis   . Dyslipidemia   . Dyspnea   . Edema   . Lumbago   . Nephrolithiasis   . Pulmonary embolism ([place]) 10/2009    PAST SURGICAL HISTORY: Past Surgical History:  Procedure Laterality Date  . EP IMPLANTABLE DEVICE N/A 12/04/2015   Procedure: Pacemaker Implant;  Surgeon: Evans Lance, MD;  Location: [facility] CV LAB;  Service: Cardiovascular;  Laterality: N/A;  . FINGER FRACTURE SURGERY  2004  . KIDNEY STONES     REMOVAL   . KNEE ARTHROSCOPY     RIGHT KNEE  . TONSILLECTOMY      SOCIAL HISTORY: Social History   Tobacco Use  . Smoking status: Former Smoker    Packs/day: 1.00    Years: 18.00    Pack years: 18.00    Types: Cigarettes    Quit date: 09/03/1987    Years since quitting: 31.4  . Smokeless tobacco: Never Used  Substance Use Topics  . Alcohol use: No  . Drug use: No    FAMILY HISTORY: Family History  Problem Relation Age of Onset  . Heart attack Father 8  . Hyperlipidemia Father   . Diabetes Father   . Hypertension Father   . Heart  disease Father   . Fibromyalgia Sister   . Stroke Mother   . Arrhythmia Mother        atrial fib.  . Depression Mother   . Anxiety disorder Mother   . Bipolar disorder Mother     ROS: Review of Systems  Gastrointestinal: Negative for nausea and vomiting.  Endo/Heme/Allergies:       Negative for hypoglycemia.    PHYSICAL EXAM: Pt in no acute distress  RECENT LABS AND TESTS: BMET    Component Value Date/Time  NA 140 12/02/2018 0809   NA 142 09/15/2018 0758   K 4.2 12/02/2018 0809   CL 105 12/02/2018 0809   CO2 27 12/02/2018 0809   GLUCOSE 99 12/02/2018 0809   BUN 17 12/02/2018 0809   BUN 21 09/15/2018 0758   CREATININE 1.36 (H) 12/02/2018 0809   CALCIUM 8.8 (L) 12/02/2018 0809   GFRNONAA 55 (L) 12/02/2018 0809   GFRAA >60 12/02/2018 0809   Lab Results  Component Value Date   HGBA1C 5.9 (H) 09/15/2018   HGBA1C 5.9 (H) 04/16/2018   HGBA1C 5.8 (H) 12/02/2015   HGBA1C 5.4 03/14/2012   Lab Results  Component Value Date   INSULIN 11.3 09/15/2018   INSULIN 15.2 04/16/2018   CBC    Component Value Date/Time   WBC 11.8 (H) 12/02/2018 0809   RBC 4.76 12/02/2018 0809   HGB 13.1 12/02/2018 0809   HGB 14.0 04/16/2018 0943   HCT 43.0 12/02/2018 0809   HCT 42.5 04/16/2018 0943   PLT 276 12/02/2018 0809   MCV 90.3 12/02/2018 0809   MCV 89 04/16/2018 0943   MCH 27.5 12/02/2018 0809   MCHC 30.5 12/02/2018 0809   RDW 13.6 12/02/2018 0809   RDW 14.4 04/16/2018 0943   LYMPHSABS 1.1 12/02/2018 0809   LYMPHSABS 1.8 04/16/2018 0943   MONOABS 0.8 12/02/2018 0809   EOSABS 0.1 12/02/2018 0809   EOSABS 0.2 04/16/2018 0943   BASOSABS 0.1 12/02/2018 0809   BASOSABS 0.1 04/16/2018 0943   Iron/TIBC/Ferritin/ %Sat No results found for: IRON, TIBC, FERRITIN, IRONPCTSAT Lipid Panel     Component Value Date/Time   CHOL 155 09/15/2018 0758   TRIG 86 09/15/2018 0758   HDL 39 (L) 09/15/2018 0758   CHOLHDL 3.8 12/02/2015 0212   VLDL 13 12/02/2015 0212   LDLCALC 99 09/15/2018  0758   Hepatic Function Panel     Component Value Date/Time   PROT 6.5 12/02/2018 0809   PROT 6.6 09/15/2018 0758   ALBUMIN 3.5 12/02/2018 0809   ALBUMIN 4.3 09/15/2018 0758   AST 14 (L) 12/02/2018 0809   ALT 11 12/02/2018 0809   ALKPHOS 105 12/02/2018 0809   BILITOT 0.7 12/02/2018 0809   BILITOT 0.6 09/15/2018 0758     Results for Sean SavoyKEHRLI, Sean D (MRN [id number]) as of 02/16/2019 13:03  Ref. Range 09/15/2018 07:58  Vitamin D, 25-Hydroxy Latest Ref Range: 30.0 - 100.0 ng/mL 39.9    I, Kirke Corinara Soares, CMA, am acting as transcriptionist for Wilder Gladearen D. Willer Osorno, MD I have reviewed the above documentation for accuracy and completeness, and I agree with the above. -Quillian Quincearen Lennard Capek, MD

## 2019-02-19 ENCOUNTER — Other Ambulatory Visit (INDEPENDENT_AMBULATORY_CARE_PROVIDER_SITE_OTHER): Payer: Self-pay | Admitting: Family Medicine

## 2019-02-19 DIAGNOSIS — R7303 Prediabetes: Secondary | ICD-10-CM

## 2019-02-22 ENCOUNTER — Other Ambulatory Visit (INDEPENDENT_AMBULATORY_CARE_PROVIDER_SITE_OTHER): Payer: Self-pay | Admitting: Family Medicine

## 2019-02-22 DIAGNOSIS — R7303 Prediabetes: Secondary | ICD-10-CM

## 2019-03-10 ENCOUNTER — Telehealth (INDEPENDENT_AMBULATORY_CARE_PROVIDER_SITE_OTHER): Payer: BC Managed Care – PPO | Admitting: Family Medicine

## 2019-03-10 ENCOUNTER — Other Ambulatory Visit: Payer: Self-pay

## 2019-03-10 ENCOUNTER — Encounter (INDEPENDENT_AMBULATORY_CARE_PROVIDER_SITE_OTHER): Payer: Self-pay | Admitting: Family Medicine

## 2019-03-10 DIAGNOSIS — R7303 Prediabetes: Secondary | ICD-10-CM

## 2019-03-10 DIAGNOSIS — Z6839 Body mass index (BMI) 39.0-39.9, adult: Secondary | ICD-10-CM | POA: Diagnosis not present

## 2019-03-11 NOTE — Progress Notes (Signed)
Office: [phone]  /  Fax: [phone] TeleHealth Visit:  Sean Winters has verbally consented to this TeleHealth visit today. The patient is located at work, the provider is located at the [facility] office. The participants in this visit include the listed provider and patient and any and all parties involved. The visit was conducted today via [email or web address].  HPI:   Chief Complaint: OBESITY Sean Winters is here to discuss his progress with his obesity treatment plan. He is on the Category 3 plan and is following his eating plan approximately 65 % of the time. He states he is walking the dog 30 minutes 7 times per week. Sean Winters continues to work on diet, but he hasn't been as strict, and he feels he has gained weight. Sean Winters is working full time and he is not concentrating on his eating as much and he struggles with meal planing. His son was recently [place] with an [place], and he has been trying to visit him if he is allowed at the hospital. We were unable to weigh the patient today for this TeleHealth visit. He feels as if he has gained weight since his last visit.   Pre-Diabetes Sean Winters has a diagnosis of prediabetes based on his elevated Hgb A1c and was informed this puts him at greater risk of developing diabetes. He is on metformin, but he is forgetting the second dose. Sean Winters is working on diet, but he is struggling more recently. He denies nausea, vomiting or hypoglycemia.  ASSESSMENT AND PLAN:  Prediabetes  Class 2 severe obesity with serious comorbidity and body mass index (BMI) of 39.0 to 39.9 in adult, unspecified obesity type (HCC)  PLAN:  Pre-Diabetes Sean Winters will continue to work on weight loss, exercise, and decreasing simple carbohydrates in his diet to help decrease the risk of diabetes. We dicussed metformin including benefits and risks. He was informed that eating too many simple carbohydrates or too many calories at one sitting  increases the likelihood of GI side effects. Sean Winters is okay to take both metformin doses in the morning with food and see how he does. We will continue to follow closely and he will follow up with us as directed to monitor his progress.  I spent > than 50% of the 25 minute visit on counseling as documented in the note.  Obesity Sean Winters is currently in the action stage of change. As such, his goal is to continue with weight loss efforts He has agreed to portion control better and make smarter food choices, such as increase vegetables and decrease simple carbohydrates  and follow the Category 3 plan Sean Winters has been instructed to work up to a goal of 150 minutes of combined cardio and strengthening exercise per week for weight loss and overall health benefits. We discussed the following Behavioral Modification Strategies today: increasing lean protein intake, work on meal planning and easy cooking plans and emotional eating strategies We discussed ways to meal plan to make dinner easier.  Sean Winters has agreed to follow up with our clinic in 2 weeks. He was informed of the importance of frequent follow up visits to maximize his success with intensive lifestyle modifications for his multiple health conditions.  ALLERGIES: Allergies  Allergen Reactions  . Haloperidol Other (See Comments)    Fatigue  . Ibuprofen Other (See Comments)    Cannot take while on lithium  . Sulfa Antibiotics Rash  . Sulfamethoxazole Rash    MEDICATIONS: Current Outpatient Medications on File Prior to Visit  Medication Sig Dispense Refill  . ASHWAGANDHA PO Take 250 mg by mouth once.    [name] aspirin EC 81 MG tablet Take 81 mg by mouth daily.    [name] atorvastatin (LIPITOR) 20 MG tablet Take 20 mg by mouth Daily.     . furosemide (LASIX) 40 MG tablet Take 2 tablets (80 mg total) by mouth daily. 60 tablet 6  . lamoTRIgine (LAMICTAL) 25 MG tablet Take 75 mg by mouth daily.  4  . lithium carbonate (LITHOBID) 300 MG CR tablet  Take 900 mg by mouth at bedtime.  4  . metFORMIN (GLUCOPHAGE) 500 MG tablet Take 1 tablet (500 mg total) by mouth 2 (two) times daily with a meal. 60 tablet 0  . OVER THE COUNTER MEDICATION Take 3 capsules by mouth daily. CocoaVia- for memory and blood flow    . OVER THE COUNTER MEDICATION Take 2 tablets by mouth daily. For memory - Sensoril 250 mg tablets    . oxybutynin (DITROPAN-XL) 10 MG 24 hr tablet Take 10 mg by mouth daily.    . potassium chloride SA (K-DUR) 20 MEQ tablet TAKE 2 TABLETS BY MOUTH  DAILY 180 tablet 1  . pramipexole (MIRAPEX) 1.5 MG tablet Take 1.5 mg by mouth at bedtime.     . Vitamin D, Ergocalciferol, (DRISDOL) 1.25 MG (50000 UT) CAPS capsule Take 1 capsule (50,000 Units total) by mouth every 7 (seven) days. 4 capsule 0  . zolpidem (AMBIEN) 10 MG tablet Take 10 mg by mouth at bedtime.      No current facility-administered medications on file prior to visit.     PAST MEDICAL HISTORY: Past Medical History:  Diagnosis Date  . Anxiety   . Bipolar disorder ([place])   . BPH (benign prostatic hypertrophy)   . Chest pain   . Complete heart block ([place])    a. s/p Medtronic PPM 2017  . Cystitis, acute   . Depression   . Diverticulitis   . Dyslipidemia   . Dyspnea   . Edema   . Lumbago   . Nephrolithiasis   . Pulmonary embolism ([place]) 10/2009    PAST SURGICAL HISTORY: Past Surgical History:  Procedure Laterality Date  . EP IMPLANTABLE DEVICE N/A 12/04/2015   Procedure: Pacemaker Implant;  Surgeon: Evans Lance, MD;  Location: [facility];  Service: Cardiovascular;  Laterality: N/A;  . FINGER FRACTURE SURGERY  2004  . KIDNEY STONES     REMOVAL   . KNEE ARTHROSCOPY     RIGHT KNEE  . TONSILLECTOMY      SOCIAL HISTORY: Social History   Tobacco Use  . Smoking status: Former Smoker    Packs/day: 1.00    Years: 18.00    Pack years: 18.00    Types: Cigarettes    Quit date: 09/03/1987    Years since quitting: 31.5  . Smokeless tobacco: Never Used   Substance Use Topics  . Alcohol use: No  . Drug use: No    FAMILY HISTORY: Family History  Problem Relation Age of Onset  . Heart attack Father 71  . Hyperlipidemia Father   . Diabetes Father   . Hypertension Father   . Heart disease Father   . Fibromyalgia Sister   . Stroke Mother   . Arrhythmia Mother        atrial fib.  . Depression Mother   . Anxiety disorder Mother   . Bipolar disorder Mother     ROS: Review of Systems  Constitutional: Negative for  weight loss.  Gastrointestinal: Negative for nausea and vomiting.  Endo/Heme/Allergies:       Negative for hypoglycemia    PHYSICAL EXAM: Pt in no acute distress  RECENT LABS AND TESTS: BMET    Component Value Date/Time   NA 140 12/02/2018 0809   NA 142 09/15/2018 0758   K 4.2 12/02/2018 0809   CL 105 12/02/2018 0809   CO2 27 12/02/2018 0809   GLUCOSE 99 12/02/2018 0809   BUN 17 12/02/2018 0809   BUN 21 09/15/2018 0758   CREATININE 1.36 (H) 12/02/2018 0809   CALCIUM 8.8 (L) 12/02/2018 0809   GFRNONAA 55 (L) 12/02/2018 0809   GFRAA >60 12/02/2018 0809   Lab Results  Component Value Date   HGBA1C 5.9 (H) 09/15/2018   HGBA1C 5.9 (H) 04/16/2018   HGBA1C 5.8 (H) 12/02/2015   HGBA1C 5.4 03/14/2012   Lab Results  Component Value Date   INSULIN 11.3 09/15/2018   INSULIN 15.2 04/16/2018   CBC    Component Value Date/Time   WBC 11.8 (H) 12/02/2018 0809   RBC 4.76 12/02/2018 0809   HGB 13.1 12/02/2018 0809   HGB 14.0 04/16/2018 0943   HCT 43.0 12/02/2018 0809   HCT 42.5 04/16/2018 0943   PLT 276 12/02/2018 0809   MCV 90.3 12/02/2018 0809   MCV 89 04/16/2018 0943   MCH 27.5 12/02/2018 0809   MCHC 30.5 12/02/2018 0809   RDW 13.6 12/02/2018 0809   RDW 14.4 04/16/2018 0943   LYMPHSABS 1.1 12/02/2018 0809   LYMPHSABS 1.8 04/16/2018 0943   MONOABS 0.8 12/02/2018 0809   EOSABS 0.1 12/02/2018 0809   EOSABS 0.2 04/16/2018 0943   BASOSABS 0.1 12/02/2018 0809   BASOSABS 0.1 04/16/2018 0943    Iron/TIBC/Ferritin/ %Sat No results found for: IRON, TIBC, FERRITIN, IRONPCTSAT Lipid Panel     Component Value Date/Time   CHOL 155 09/15/2018 0758   TRIG 86 09/15/2018 0758   HDL 39 (L) 09/15/2018 0758   CHOLHDL 3.8 12/02/2015 0212   VLDL 13 12/02/2015 0212   LDLCALC 99 09/15/2018 0758   Hepatic Function Panel     Component Value Date/Time   PROT 6.5 12/02/2018 0809   PROT 6.6 09/15/2018 0758   ALBUMIN 3.5 12/02/2018 0809   ALBUMIN 4.3 09/15/2018 0758   AST 14 (L) 12/02/2018 0809   ALT 11 12/02/2018 0809   ALKPHOS 105 12/02/2018 0809   BILITOT 0.7 12/02/2018 0809   BILITOT 0.6 09/15/2018 0758       Ref. Range 09/15/2018 07:58  Vitamin D, 25-Hydroxy Latest Ref Range: 30.0 - 100.0 ng/mL 39.9   I, [place] CraneJoanne Murray, am acting as Energy managertranscriptionist for Quillian Quincearen Taos Tapp, MD I have reviewed the above documentation for accuracy and completeness, and I agree with the above. -Quillian Quincearen Ladena Jacquez, MD

## 2019-03-24 ENCOUNTER — Other Ambulatory Visit: Payer: Self-pay

## 2019-03-24 ENCOUNTER — Encounter (INDEPENDENT_AMBULATORY_CARE_PROVIDER_SITE_OTHER): Payer: Self-pay

## 2019-03-24 ENCOUNTER — Telehealth (INDEPENDENT_AMBULATORY_CARE_PROVIDER_SITE_OTHER): Payer: BC Managed Care – PPO | Admitting: Family Medicine

## 2019-03-24 ENCOUNTER — Encounter (INDEPENDENT_AMBULATORY_CARE_PROVIDER_SITE_OTHER): Payer: Self-pay | Admitting: Family Medicine

## 2019-03-24 DIAGNOSIS — R7303 Prediabetes: Secondary | ICD-10-CM

## 2019-03-24 DIAGNOSIS — E559 Vitamin D deficiency, unspecified: Secondary | ICD-10-CM | POA: Diagnosis not present

## 2019-03-24 DIAGNOSIS — Z6839 Body mass index (BMI) 39.0-39.9, adult: Secondary | ICD-10-CM | POA: Diagnosis not present

## 2019-03-24 MED ORDER — METFORMIN HCL 500 MG PO TABS: 500.0000 mg | ORAL_TABLET | Freq: Two times a day (BID) | ORAL | 0 refills | Status: DC

## 2019-03-24 MED ORDER — VITAMIN D (ERGOCALCIFEROL) 1.25 MG (50000 UNIT) PO CAPS: 50000.0000 [IU] | ORAL_CAPSULE | ORAL | 0 refills | Status: DC

## 2019-03-25 NOTE — Progress Notes (Signed)
Office: [phone]  /  Fax: [phone] TeleHealth Visit:  Tye SavoyGregory D Burkhalter has verbally consented to this TeleHealth visit today. The patient is located at work, the provider is located at the [facility] office. The participants in this visit include the listed provider and patient. The visit was conducted today via [email or web address].  HPI:   Chief Complaint: OBESITY Sean Winters is here to discuss his progress with his obesity treatment plan. He is on the Category 3 plan and is following his eating plan approximately 70% of the time. He states he is walking the dog 30 minutes 6 days a week. Sean Winters has not been concentrating on weight loss in the last 2 weeks since his son's hospitalization after an ATV accident and his son is still hospitalized with a brain injury. Sean Winters suspects he has gained some weight but hasn't weighed.  We were unable to weigh the patient today for this TeleHealth visit. He feels as if he has gained weight since his last visit. He has lost 8 lbs since starting treatment with us.  Pre-Diabetes Sean Winters has a diagnosis of prediabetes based on his elevated Hgb A1c and was informed this puts him at greater risk of developing diabetes. He is stable on metformin currently and continues to work on diet and exercise to decrease risk of diabetes. He has increased simple carbs recently due to increased stress eating. He denies nausea, vomiting, or hypoglycemia.  Vitamin D deficiency Sean Winters has a diagnosis of Vitamin D deficiency, which is not yet at goal. He is currently stable on prescription Vit D and denies nausea, vomiting or muscle weakness, but does report fatigue.  ASSESSMENT AND PLAN:  Vitamin D deficiency - Plan: Vitamin D, Ergocalciferol, (DRISDOL) 1.25 MG (50000 UT) CAPS capsule  Prediabetes - Plan: metFORMIN (GLUCOPHAGE) 500 MG tablet  Class 2 severe obesity with serious comorbidity and body mass index (BMI) of 39.0 to 39.9 in adult, unspecified obesity  type (HCC)  PLAN:  Pre-Diabetes Sean Winters will continue to work on weight loss, exercise, and decreasing simple carbohydrates in his diet to help decrease the risk of diabetes. We dicussed metformin including benefits and risks. He was informed that eating too many simple carbohydrates or too many calories at one sitting increases the likelihood of GI side effects. Sean Winters was given a refill on his metformin 500 mg #60 with 0 refills and agrees to follow-up with our clinic in 3 weeks. He was instructed to get back to his diet.  Vitamin D Deficiency Sean Winters was informed that low Vitamin D levels contributes to fatigue and are associated with obesity, breast, and colon cancer. He agrees to continue to take prescription Vit D @ 50,000 IU every week #4 with 0 refills and will follow-up for routine testing of Vitamin D, at least 2-3 times per year. He was informed of the risk of over-replacement of Vitamin D and agrees to not increase his dose unless he discusses this with us first. Sean Winters agrees to follow-up with our clinic in 3 weeks.  I spent > than 50% of the 25 minute visit on counseling as documented in the note.  Obesity Sean Winters is currently in the action stage of change. As such, his goal is to maintain his weight during this time. He has agreed to follow the Category 3 plan. Sean Winters has been instructed to work up to a goal of 150 minutes of combined cardio and strengthening exercise per week for weight loss and overall health benefits. We discussed the following Behavioral  Modification Strategies today: increasing lean protein intake, work on meal planning and easy cooking plans, better snacking choices, and emotional eating strategies.  Sean Winters has agreed to follow-up with our clinic in 3 weeks. He was informed of the importance of frequent follow-up visits to maximize his success with intensive lifestyle modifications for his multiple health conditions.  ALLERGIES: Allergies  Allergen  Reactions  . Haloperidol Other (See Comments)    Fatigue  . Ibuprofen Other (See Comments)    Cannot take while on lithium  . Sulfa Antibiotics Rash  . Sulfamethoxazole Rash    MEDICATIONS: Current Outpatient Medications on File Prior to Visit  Medication Sig Dispense Refill  . ASHWAGANDHA PO Take 250 mg by mouth once.    [name]. aspirin EC 81 MG tablet Take 81 mg by mouth daily.    [name]. atorvastatin (LIPITOR) 20 MG tablet Take 20 mg by mouth Daily.     . furosemide (LASIX) 40 MG tablet Take 2 tablets (80 mg total) by mouth daily. 60 tablet 6  . lamoTRIgine (LAMICTAL) 25 MG tablet Take 75 mg by mouth daily.  4  . lithium carbonate (LITHOBID) 300 MG CR tablet Take 900 mg by mouth at bedtime.  4  . OVER THE COUNTER MEDICATION Take 3 capsules by mouth daily. CocoaVia- for memory and blood flow    . OVER THE COUNTER MEDICATION Take 2 tablets by mouth daily. For memory - Sensoril 250 mg tablets    . oxybutynin (DITROPAN-XL) 10 MG 24 hr tablet Take 10 mg by mouth daily.    . potassium chloride SA (K-DUR) 20 MEQ tablet TAKE 2 TABLETS BY MOUTH  DAILY 180 tablet 1  . pramipexole (MIRAPEX) 1.5 MG tablet Take 1.5 mg by mouth at bedtime.      No current facility-administered medications on file prior to visit.     PAST MEDICAL HISTORY: Past Medical History:  Diagnosis Date  . Anxiety   . Bipolar disorder ([facility])   . BPH (benign prostatic hypertrophy)   . Chest pain   . Complete heart block ([facility])    a. s/p Medtronic PPM 2017  . Cystitis, acute   . Depression   . Diverticulitis   . Dyslipidemia   . Dyspnea   . Edema   . Lumbago   . Nephrolithiasis   . Pulmonary embolism ([facility]) 10/2009    PAST SURGICAL HISTORY: Past Surgical History:  Procedure Laterality Date  . EP IMPLANTABLE DEVICE N/A 12/04/2015   Procedure: Pacemaker Implant;  Surgeon: Marinus MawGregg W Taylor, MD;  [facility];  Service: Cardiovascular;  Laterality: N/A;  . FINGER FRACTURE SURGERY  2004  . KIDNEY STONES      REMOVAL   . KNEE ARTHROSCOPY     RIGHT KNEE  . TONSILLECTOMY      SOCIAL HISTORY: Social History   Tobacco Use  . Smoking status: Former Smoker    Packs/day: 1.00    Years: 18.00    Pack years: 18.00    Types: Cigarettes    Quit date: 09/03/1987    Years since quitting: 31.5  . Smokeless tobacco: Never Used  Substance Use Topics  . Alcohol use: No  . Drug use: No    FAMILY HISTORY: Family History  Problem Relation Age of Onset  . Heart attack Father 4450  . Hyperlipidemia Father   . Diabetes Father   . Hypertension Father   . Heart disease Father   . Fibromyalgia Sister   . Stroke Mother   .  Arrhythmia Mother        atrial fib.  . Depression Mother   . Anxiety disorder Mother   . Bipolar disorder Mother    ROS: Review of Systems  Constitutional: Positive for malaise/fatigue.  Gastrointestinal: Negative for nausea and vomiting.  Musculoskeletal:       Negative for muscle weakness.  Endo/Heme/Allergies:       Negative for hypoglycemia.   PHYSICAL EXAM: Pt in no acute distress  RECENT LABS AND TESTS: BMET    Component Value Date/Time   NA 140 12/02/2018 0809   NA 142 09/15/2018 0758   K 4.2 12/02/2018 0809   CL 105 12/02/2018 0809   CO2 27 12/02/2018 0809   GLUCOSE 99 12/02/2018 0809   BUN 17 12/02/2018 0809   BUN 21 09/15/2018 0758   CREATININE 1.36 (H) 12/02/2018 0809   CALCIUM 8.8 (L) 12/02/2018 0809   GFRNONAA 55 (L) 12/02/2018 0809   GFRAA >60 12/02/2018 0809   Lab Results  Component Value Date   HGBA1C 5.9 (H) 09/15/2018   HGBA1C 5.9 (H) 04/16/2018   HGBA1C 5.8 (H) 12/02/2015   HGBA1C 5.4 03/14/2012   Lab Results  Component Value Date   INSULIN 11.3 09/15/2018   INSULIN 15.2 04/16/2018   CBC    Component Value Date/Time   WBC 11.8 (H) 12/02/2018 0809   RBC 4.76 12/02/2018 0809   HGB 13.1 12/02/2018 0809   HGB 14.0 04/16/2018 0943   HCT 43.0 12/02/2018 0809   HCT 42.5 04/16/2018 0943   PLT 276 12/02/2018 0809   MCV 90.3  12/02/2018 0809   MCV 89 04/16/2018 0943   MCH 27.5 12/02/2018 0809   MCHC 30.5 12/02/2018 0809   RDW 13.6 12/02/2018 0809   RDW 14.4 04/16/2018 0943   LYMPHSABS 1.1 12/02/2018 0809   LYMPHSABS 1.8 04/16/2018 0943   MONOABS 0.8 12/02/2018 0809   EOSABS 0.1 12/02/2018 0809   EOSABS 0.2 04/16/2018 0943   BASOSABS 0.1 12/02/2018 0809   BASOSABS 0.1 04/16/2018 0943   Iron/TIBC/Ferritin/ %Sat No results found for: IRON, TIBC, FERRITIN, IRONPCTSAT Lipid Panel     Component Value Date/Time   CHOL 155 09/15/2018 0758   TRIG 86 09/15/2018 0758   HDL 39 (L) 09/15/2018 0758   CHOLHDL 3.8 12/02/2015 0212   VLDL 13 12/02/2015 0212   LDLCALC 99 09/15/2018 0758   Hepatic Function Panel     Component Value Date/Time   PROT 6.5 12/02/2018 0809   PROT 6.6 09/15/2018 0758   ALBUMIN 3.5 12/02/2018 0809   ALBUMIN 4.3 09/15/2018 0758   AST 14 (L) 12/02/2018 0809   ALT 11 12/02/2018 0809   ALKPHOS 105 12/02/2018 0809   BILITOT 0.7 12/02/2018 0809   BILITOT 0.6 09/15/2018 0758    Results for ASIER, DESROCHES (MRN [id number]) as of 03/25/2019 10:33  Ref. Range 09/15/2018 07:58  Vitamin D, 25-Hydroxy Latest Ref Range: 30.0 - 100.0 ng/mL 39.9   I, Michaelene Song, am acting as Location manager for Dennard Nip, MD I have reviewed the above documentation for accuracy and completeness, and I agree with the above. -Dennard Nip, MD

## 2019-04-03 ENCOUNTER — Other Ambulatory Visit: Payer: Self-pay | Admitting: Internal Medicine

## 2019-04-08 ENCOUNTER — Ambulatory Visit (INDEPENDENT_AMBULATORY_CARE_PROVIDER_SITE_OTHER): Payer: BC Managed Care – PPO | Admitting: *Deleted

## 2019-04-08 DIAGNOSIS — I442 Atrioventricular block, complete: Secondary | ICD-10-CM

## 2019-04-09 ENCOUNTER — Telehealth: Payer: Self-pay

## 2019-04-09 NOTE — Telephone Encounter (Signed)
Spoke with patient to remind of missed remote transmission 

## 2019-04-12 LAB — CUP PACEART REMOTE DEVICE CHECK
Battery Remaining Longevity: 90 mo
Battery Voltage: 3.02 V
Brady Statistic AP VP Percent: 2.38 %
Brady Statistic AP VS Percent: 0.65 %
Brady Statistic AS VP Percent: 0.19 %
Brady Statistic AS VS Percent: 96.78 %
Brady Statistic RA Percent Paced: 3.03 %
Brady Statistic RV Percent Paced: 2.6 %
Date Time Interrogation Session: 20200810043146
Implantable Lead Implant Date: 20170403
Implantable Lead Implant Date: 20170403
Implantable Lead Location: 753859
Implantable Lead Location: 753860
Implantable Lead Model: 5076
Implantable Lead Model: 5076
Implantable Pulse Generator Implant Date: 20170403
Lead Channel Impedance Value: 380 Ohm
Lead Channel Impedance Value: 456 Ohm
Lead Channel Impedance Value: 494 Ohm
Lead Channel Impedance Value: 513 Ohm
Lead Channel Pacing Threshold Amplitude: 0.625 V
Lead Channel Pacing Threshold Amplitude: 1.125 V
Lead Channel Pacing Threshold Pulse Width: 0.4 ms
Lead Channel Pacing Threshold Pulse Width: 0.4 ms
Lead Channel Sensing Intrinsic Amplitude: 15.625 mV
Lead Channel Sensing Intrinsic Amplitude: 15.625 mV
Lead Channel Sensing Intrinsic Amplitude: 5.25 mV
Lead Channel Sensing Intrinsic Amplitude: 5.25 mV
Lead Channel Setting Pacing Amplitude: 2 V
Lead Channel Setting Pacing Amplitude: 2.5 V
Lead Channel Setting Pacing Pulse Width: 0.4 ms
Lead Channel Setting Sensing Sensitivity: 2 mV

## 2019-04-13 ENCOUNTER — Encounter: Payer: Self-pay | Admitting: Cardiology

## 2019-04-13 NOTE — Progress Notes (Signed)
Remote pacemaker transmission.   

## 2019-04-14 ENCOUNTER — Telehealth (INDEPENDENT_AMBULATORY_CARE_PROVIDER_SITE_OTHER): Payer: BC Managed Care – PPO | Admitting: Family Medicine

## 2019-04-15 ENCOUNTER — Encounter (INDEPENDENT_AMBULATORY_CARE_PROVIDER_SITE_OTHER): Payer: Self-pay

## 2019-04-28 ENCOUNTER — Encounter (INDEPENDENT_AMBULATORY_CARE_PROVIDER_SITE_OTHER): Payer: Self-pay

## 2019-04-28 ENCOUNTER — Encounter (INDEPENDENT_AMBULATORY_CARE_PROVIDER_SITE_OTHER): Payer: Self-pay | Admitting: Family Medicine

## 2019-04-28 ENCOUNTER — Other Ambulatory Visit: Payer: Self-pay

## 2019-04-28 ENCOUNTER — Telehealth (INDEPENDENT_AMBULATORY_CARE_PROVIDER_SITE_OTHER): Payer: BC Managed Care – PPO | Admitting: Family Medicine

## 2019-04-28 DIAGNOSIS — Z6839 Body mass index (BMI) 39.0-39.9, adult: Secondary | ICD-10-CM | POA: Diagnosis not present

## 2019-04-28 DIAGNOSIS — E559 Vitamin D deficiency, unspecified: Secondary | ICD-10-CM | POA: Diagnosis not present

## 2019-04-28 DIAGNOSIS — R7303 Prediabetes: Secondary | ICD-10-CM | POA: Diagnosis not present

## 2019-04-28 MED ORDER — METFORMIN HCL 500 MG PO TABS: 500.0000 mg | ORAL_TABLET | Freq: Two times a day (BID) | ORAL | 0 refills | Status: DC

## 2019-04-28 MED ORDER — VITAMIN D (ERGOCALCIFEROL) 1.25 MG (50000 UNIT) PO CAPS: 50000.0000 [IU] | ORAL_CAPSULE | ORAL | 0 refills | Status: DC

## 2019-04-29 NOTE — Progress Notes (Signed)
Office: [phone]  /  Fax: [phone] TeleHealth Visit:  Tye SavoyGregory D Soberano has verbally consented to this TeleHealth visit today. The patient is located at home, the provider is located at the [facility] office. The participants in this visit include the listed provider and patient. The visit was conducted today via [email or web address].  HPI:   Chief Complaint: OBESITY Sean Winters is here to discuss his progress with his obesity treatment plan. He is on the Category 3 plan and is following his eating plan approximately 65 % of the time. He states he is walking the dog for 30 minutes 5 times per week. Sean Winters continues to struggle with following his plan and feels he is still gaining weight. He notes decreased support from his wife and increased stress with college classes. He would like to change to another eating plan.  We were unable to weigh the patient today for this TeleHealth visit. He feels as if he has gained weight since his last visit. He has lost 8 lbs since starting treatment with us.  Vitamin D Deficiency Sean Winters has a diagnosis of vitamin D deficiency. He is stable on prescription Vit D and denies nausea, vomiting or muscle weakness.  Pre-Diabetes Sean Winters has a diagnosis of pre-diabetes based on his elevated Hgb A1c and was informed this puts him at greater risk of developing diabetes. He struggles with following the eating plan. He notes decreased polyphagia when he does though. He is taking metformin currently and continues to work on diet and exercise to decrease risk of diabetes. He denies nausea, vomiting, or hypoglycemia.  ASSESSMENT AND PLAN:  Class 2 severe obesity with serious comorbidity and body mass index (BMI) of 39.0 to 39.9 in adult, unspecified obesity type (HCC)  Vitamin D deficiency - Plan: Vitamin D, Ergocalciferol, (DRISDOL) 1.25 MG (50000 UT) CAPS capsule  Prediabetes - Plan: metFORMIN (GLUCOPHAGE) 500 MG tablet  PLAN:  Vitamin D Deficiency  Sean Winters was informed that low vitamin D levels contributes to fatigue and are associated with obesity, breast, and colon cancer. Sean Winters agrees to continue taking prescription Vit D 50,000 IU every week #4 and we will refill for 1 month. He will follow up for routine testing of vitamin D, at least 2-3 times per year. He was informed of the risk of over-replacement of vitamin D and agrees to not increase his dose unless he discusses this with us first. Sean Winters agrees to follow up with our clinic in 2 weeks.  Pre-Diabetes Sean Winters will change diet prescription, and will continue to work on weight loss, exercise, and decreasing simple carbohydrates in his diet to help decrease the risk of diabetes. We dicussed metformin including benefits and risks. He was informed that eating too many simple carbohydrates or too many calories at one sitting increases the likelihood of GI side effects. Sean Winters agrees to continue taking metformin 500 mg BID #60 and we will refill for 1 month. Sean Winters agrees to follow up with our clinic in 2 weeks as directed to monitor his progress.  Obesity Sean Winters is currently in the action stage of change. As such, his goal is to continue with weight loss efforts He has agreed to change to follow a lower carbohydrate, vegetable and lean protein rich diet plan Sean Winters has been instructed to work up to a goal of 150 minutes of combined cardio and strengthening exercise per week for weight loss and overall health benefits. We discussed the following Behavioral Modification Strategies today: increasing vegetables, increase H20 intake, and decrease eating  out   [name] has agreed to follow up with our clinic in 2 weeks. He was informed of the importance of frequent follow up visits to maximize his success with intensive lifestyle modifications for his multiple health conditions.  ALLERGIES: Allergies  Allergen Reactions  . Haloperidol Other (See Comments)    Fatigue  . Ibuprofen Other  (See Comments)    Cannot take while on lithium  . Sulfa Antibiotics Rash  . Sulfamethoxazole Rash    MEDICATIONS: Current Outpatient Medications on File Prior to Visit  Medication Sig Dispense Refill  . ASHWAGANDHA PO Take 250 mg by mouth once.    [name] aspirin EC 81 MG tablet Take 81 mg by mouth daily.    [name] atorvastatin (LIPITOR) 20 MG tablet Take 20 mg by mouth Daily.     . furosemide (LASIX) 40 MG tablet Take 2 tablets (80 mg total) by mouth daily. Please keep upcoming appt for future refills. Thank you 180 tablet 0  . lamoTRIgine (LAMICTAL) 25 MG tablet Take 75 mg by mouth daily.  4  . lithium carbonate (LITHOBID) 300 MG CR tablet Take 900 mg by mouth at bedtime.  4  . OVER THE COUNTER MEDICATION Take 3 capsules by mouth daily. CocoaVia- for memory and blood flow    . OVER THE COUNTER MEDICATION Take 2 tablets by mouth daily. For memory - Sensoril 250 mg tablets    . oxybutynin (DITROPAN-XL) 10 MG 24 hr tablet Take 10 mg by mouth daily.    . potassium chloride SA (K-DUR) 20 MEQ tablet TAKE 2 TABLETS BY MOUTH  DAILY 180 tablet 1  . pramipexole (MIRAPEX) 1.5 MG tablet Take 1.5 mg by mouth at bedtime.      No current facility-administered medications on file prior to visit.     PAST MEDICAL HISTORY: Past Medical History:  Diagnosis Date  . Anxiety   . Bipolar disorder ([place])   . BPH (benign prostatic hypertrophy)   . Chest pain   . Complete heart block ([place])    a. s/p Medtronic PPM 2017  . Cystitis, acute   . Depression   . Diverticulitis   . Dyslipidemia   . Dyspnea   . Edema   . Lumbago   . Nephrolithiasis   . Pulmonary embolism ([place]) 10/2009    PAST SURGICAL HISTORY: Past Surgical History:  Procedure Laterality Date  . EP IMPLANTABLE DEVICE N/A 12/04/2015   Procedure: Pacemaker Implant;  Surgeon: Evans Lance, MD;  Location: [facility];  Service: Cardiovascular;  Laterality: N/A;  . FINGER FRACTURE SURGERY  2004  . KIDNEY STONES     REMOVAL   . KNEE  ARTHROSCOPY     RIGHT KNEE  . TONSILLECTOMY      SOCIAL HISTORY: Social History   Tobacco Use  . Smoking status: Former Smoker    Packs/day: 1.00    Years: 18.00    Pack years: 18.00    Types: Cigarettes    Quit date: 09/03/1987    Years since quitting: 31.6  . Smokeless tobacco: Never Used  Substance Use Topics  . Alcohol use: No  . Drug use: No    FAMILY HISTORY: Family History  Problem Relation Age of Onset  . Heart attack Father 18  . Hyperlipidemia Father   . Diabetes Father   . Hypertension Father   . Heart disease Father   . Fibromyalgia Sister   . Stroke Mother   . Arrhythmia Mother  atrial fib.  . Depression Mother   . Anxiety disorder Mother   . Bipolar disorder Mother     ROS: Review of Systems  Constitutional: Negative for weight loss.  Gastrointestinal: Negative for nausea and vomiting.  Musculoskeletal:       Negative muscle weakness  Endo/Heme/Allergies:       Negative hypoglycemia Positive polyphagia    PHYSICAL EXAM: Pt in no acute distress  RECENT LABS AND TESTS: BMET    Component Value Date/Time   NA 140 12/02/2018 0809   NA 142 09/15/2018 0758   K 4.2 12/02/2018 0809   CL 105 12/02/2018 0809   CO2 27 12/02/2018 0809   GLUCOSE 99 12/02/2018 0809   BUN 17 12/02/2018 0809   BUN 21 09/15/2018 0758   CREATININE 1.36 (H) 12/02/2018 0809   CALCIUM 8.8 (L) 12/02/2018 0809   GFRNONAA 55 (L) 12/02/2018 0809   GFRAA >60 12/02/2018 0809   Lab Results  Component Value Date   HGBA1C 5.9 (H) 09/15/2018   HGBA1C 5.9 (H) 04/16/2018   HGBA1C 5.8 (H) 12/02/2015   HGBA1C 5.4 03/14/2012   Lab Results  Component Value Date   INSULIN 11.3 09/15/2018   INSULIN 15.2 04/16/2018   CBC    Component Value Date/Time   WBC 11.8 (H) 12/02/2018 0809   RBC 4.76 12/02/2018 0809   HGB 13.1 12/02/2018 0809   HGB 14.0 04/16/2018 0943   HCT 43.0 12/02/2018 0809   HCT 42.5 04/16/2018 0943   PLT 276 12/02/2018 0809   MCV 90.3 12/02/2018 0809    MCV 89 04/16/2018 0943   MCH 27.5 12/02/2018 0809   MCHC 30.5 12/02/2018 0809   RDW 13.6 12/02/2018 0809   RDW 14.4 04/16/2018 0943   LYMPHSABS 1.1 12/02/2018 0809   LYMPHSABS 1.8 04/16/2018 0943   MONOABS 0.8 12/02/2018 0809   EOSABS 0.1 12/02/2018 0809   EOSABS 0.2 04/16/2018 0943   BASOSABS 0.1 12/02/2018 0809   BASOSABS 0.1 04/16/2018 0943   Iron/TIBC/Ferritin/ %Sat No results found for: IRON, TIBC, FERRITIN, IRONPCTSAT Lipid Panel     Component Value Date/Time   CHOL 155 09/15/2018 0758   TRIG 86 09/15/2018 0758   HDL 39 (L) 09/15/2018 0758   CHOLHDL 3.8 12/02/2015 0212   VLDL 13 12/02/2015 0212   LDLCALC 99 09/15/2018 0758   Hepatic Function Panel     Component Value Date/Time   PROT 6.5 12/02/2018 0809   PROT 6.6 09/15/2018 0758   ALBUMIN 3.5 12/02/2018 0809   ALBUMIN 4.3 09/15/2018 0758   AST 14 (L) 12/02/2018 0809   ALT 11 12/02/2018 0809   ALKPHOS 105 12/02/2018 0809   BILITOT 0.7 12/02/2018 0809   BILITOT 0.6 09/15/2018 0758       I, Burt Knack, am acting as transcriptionist for Quillian Quince, MD I have reviewed the above documentation for accuracy and completeness, and I agree with the above. -Quillian Quince, MD

## 2019-05-04 ENCOUNTER — Encounter: Payer: BLUE CROSS/BLUE SHIELD | Admitting: Internal Medicine

## 2019-05-12 ENCOUNTER — Telehealth (INDEPENDENT_AMBULATORY_CARE_PROVIDER_SITE_OTHER): Payer: Self-pay | Admitting: Family Medicine

## 2019-05-12 ENCOUNTER — Telehealth (INDEPENDENT_AMBULATORY_CARE_PROVIDER_SITE_OTHER): Payer: BC Managed Care – PPO | Admitting: Family Medicine

## 2019-05-12 NOTE — Telephone Encounter (Signed)
Emailed directly to patient, patient was able to open up the meal plan.   Martha Soltys R CMA

## 2019-05-12 NOTE — Telephone Encounter (Signed)
Patient says he only received the cover page of the diet sent to him.  He would like someone to re-send the diet to him.

## 2019-05-31 ENCOUNTER — Other Ambulatory Visit: Payer: Self-pay

## 2019-05-31 ENCOUNTER — Ambulatory Visit (INDEPENDENT_AMBULATORY_CARE_PROVIDER_SITE_OTHER): Payer: BC Managed Care – PPO | Admitting: Family Medicine

## 2019-05-31 VITALS — BP 135/78 | HR 71 | Temp 97.5°F | Ht 66.0 in | Wt 249.0 lb

## 2019-05-31 DIAGNOSIS — Z6841 Body Mass Index (BMI) 40.0 and over, adult: Secondary | ICD-10-CM

## 2019-05-31 DIAGNOSIS — E559 Vitamin D deficiency, unspecified: Secondary | ICD-10-CM

## 2019-05-31 MED ORDER — VITAMIN D (ERGOCALCIFEROL) 1.25 MG (50000 UNIT) PO CAPS: 50000.0000 [IU] | ORAL_CAPSULE | ORAL | 0 refills | Status: DC

## 2019-06-01 NOTE — Progress Notes (Signed)
Office: [phone]  /  Fax: [phone]   HPI:   Chief Complaint: OBESITY Sean Winters is here to discuss his progress with his obesity treatment plan. He is on the lower carbohydrate, vegetable and lean protein rich diet plan and is following his eating plan approximately 50 % of the time. He states he is walking for 30 minutes 7 times per week. Sean Winters's last in office visit her was 7 months ago, and he has gained approximately 7 lbs during that time. He is mostly trying to portion control, but trying to increase protein and vegetables.  His weight is 249 lb (112.9 kg) today and has gained 7 lbs since his last visit. He has lost 1 lb since starting treatment with us.  Vitamin D Deficiency Sean Winters has a diagnosis of vitamin D deficiency. He is stable on prescription Vit D and denies nausea, vomiting or muscle weakness.  ASSESSMENT AND PLAN:  Vitamin D deficiency - Plan: Vitamin D, Ergocalciferol, (DRISDOL) 1.25 MG (50000 UT) CAPS capsule  Class 3 severe obesity with serious comorbidity and body mass index (BMI) of 40.0 to 44.9 in adult, unspecified obesity type (HCC)  PLAN:  Vitamin D Deficiency Sean Winters was informed that low vitamin D levels contributes to fatigue and are associated with obesity, breast, and colon cancer. Sean Winters agrees to continue taking prescription Vit D 50,000 IU every week #4 and we will refill for 1 month. He will follow up for routine testing of vitamin D, at least 2-3 times per year. He was informed of the risk of over-replacement of vitamin D and agrees to not increase his dose unless he discusses this with us first. We will recheck labs at next visit. Sean Winters agrees to follow up with our clinic in 3 weeks.  Obesity Sean Winters is currently in the action stage of change. As such, his goal is to continue with weight loss efforts He has agreed to follow a lower carbohydrate, vegetable and lean protein rich diet plan Sean Winters has been instructed to work up to a goal  of 150 minutes of combined cardio and strengthening exercise per week for weight loss and overall health benefits. We discussed the following Behavioral Modification Strategies today: increasing lean protein intake, decreasing simple carbohydrates, increasing vegetables, and increase H20 intake   Sean Winters has agreed to follow up with our clinic in 3 weeks. He was informed of the importance of frequent follow up visits to maximize his success with intensive lifestyle modifications for his multiple health conditions.  ALLERGIES: Allergies  Allergen Reactions   Haloperidol Other (See Comments)    Fatigue   Ibuprofen Other (See Comments)    Cannot take while on lithium   Sulfa Antibiotics Rash   Sulfamethoxazole Rash    MEDICATIONS: Current Outpatient Medications on File Prior to Visit  Medication Sig Dispense Refill   ASHWAGANDHA PO Take 250 mg by mouth once.     aspirin EC 81 MG tablet Take 81 mg by mouth daily.     atorvastatin (LIPITOR) 20 MG tablet Take 20 mg by mouth Daily.      furosemide (LASIX) 40 MG tablet Take 2 tablets (80 mg total) by mouth daily. Please keep upcoming appt for future refills. Thank you 180 tablet 0   lamoTRIgine (LAMICTAL) 25 MG tablet Take 75 mg by mouth daily.  4   lithium carbonate (LITHOBID) 300 MG CR tablet Take 900 mg by mouth at bedtime.  4   metFORMIN (GLUCOPHAGE) 500 MG tablet Take 1 tablet (500 mg total) by  mouth 2 (two) times daily with a meal. 60 tablet 0   OVER THE COUNTER MEDICATION Take 3 capsules by mouth daily. CocoaVia- for memory and blood flow     OVER THE COUNTER MEDICATION Take 2 tablets by mouth daily. For memory - Sensoril 250 mg tablets     oxybutynin (DITROPAN-XL) 10 MG 24 hr tablet Take 10 mg by mouth daily.     potassium chloride SA (K-DUR) 20 MEQ tablet TAKE 2 TABLETS BY MOUTH  DAILY 180 tablet 1   pramipexole (MIRAPEX) 1.5 MG tablet Take 1.5 mg by mouth at bedtime.      No current facility-administered medications  on file prior to visit.     PAST MEDICAL HISTORY: Past Medical History:  Diagnosis Date   Anxiety    Bipolar disorder (HCC)    BPH (benign prostatic hypertrophy)    Chest pain    Complete heart block ([facility])    a. s/p Medtronic PPM 2017   Cystitis, acute    Depression    Diverticulitis    Dyslipidemia    Dyspnea    Edema    Lumbago    Nephrolithiasis    Pulmonary embolism ([facility]) 10/2009    PAST SURGICAL HISTORY: Past Surgical History:  Procedure Laterality Date   EP IMPLANTABLE DEVICE N/A 12/04/2015   Procedure: Pacemaker Implant;  Surgeon: Marinus Maw, MD;  Location: [facility];  Service: Cardiovascular;  Laterality: N/A;   FINGER FRACTURE SURGERY  2004   KIDNEY STONES     REMOVAL    KNEE ARTHROSCOPY     RIGHT KNEE   TONSILLECTOMY      SOCIAL HISTORY: Social History   Tobacco Use   Smoking status: Former Smoker    Packs/day: 1.00    Years: 18.00    Pack years: 18.00    Types: Cigarettes    Quit date: 09/03/1987    Years since quitting: 31.7   Smokeless tobacco: Never Used  Substance Use Topics   Alcohol use: No   Drug use: No    FAMILY HISTORY: Family History  Problem Relation Age of Onset   Heart attack Father 34   Hyperlipidemia Father    Diabetes Father    Hypertension Father    Heart disease Father    Fibromyalgia Sister    Stroke Mother    Arrhythmia Mother        atrial fib.   Depression Mother    Anxiety disorder Mother    Bipolar disorder Mother     ROS: Review of Systems  Constitutional: Negative for weight loss.  Gastrointestinal: Negative for nausea and vomiting.  Musculoskeletal:       Negative muscle weakness    PHYSICAL EXAM: Blood pressure 135/78, pulse 71, temperature (!) 97.5 F (36.4 C), temperature source Oral, height  (1.676 m), weight 249 lb (112.9 kg), SpO2 95 %. Body mass index is 40.19 kg/m. Physical Exam Vitals signs reviewed.  Constitutional:      Appearance:  Normal appearance. He is obese.  Cardiovascular:     Rate and Rhythm: Normal rate.     Pulses: Normal pulses.  Pulmonary:     Effort: Pulmonary effort is normal.     Breath sounds: Normal breath sounds.  Musculoskeletal: Normal range of motion.  Skin:    General: Skin is warm and dry.  Neurological:     Mental Status: He is alert and oriented to person, place, and time.  Psychiatric:  Mood and Affect: Mood normal.        Behavior: Behavior normal.     RECENT LABS AND TESTS: BMET    Component Value Date/Time   NA 140 12/02/2018 0809   NA 142 09/15/2018 0758   K 4.2 12/02/2018 0809   CL 105 12/02/2018 0809   CO2 27 12/02/2018 0809   GLUCOSE 99 12/02/2018 0809   BUN 17 12/02/2018 0809   BUN 21 09/15/2018 0758   CREATININE 1.36 (H) 12/02/2018 0809   CALCIUM 8.8 (L) 12/02/2018 0809   GFRNONAA 55 (L) 12/02/2018 0809   GFRAA >60 12/02/2018 0809   Lab Results  Component Value Date   HGBA1C 5.9 (H) 09/15/2018   HGBA1C 5.9 (H) 04/16/2018   HGBA1C 5.8 (H) 12/02/2015   HGBA1C 5.4 03/14/2012   Lab Results  Component Value Date   INSULIN 11.3 09/15/2018   INSULIN 15.2 04/16/2018   CBC    Component Value Date/Time   WBC 11.8 (H) 12/02/2018 0809   RBC 4.76 12/02/2018 0809   HGB 13.1 12/02/2018 0809   HGB 14.0 04/16/2018 0943   HCT 43.0 12/02/2018 0809   HCT 42.5 04/16/2018 0943   PLT 276 12/02/2018 0809   MCV 90.3 12/02/2018 0809   MCV 89 04/16/2018 0943   MCH 27.5 12/02/2018 0809   MCHC 30.5 12/02/2018 0809   RDW 13.6 12/02/2018 0809   RDW 14.4 04/16/2018 0943   LYMPHSABS 1.1 12/02/2018 0809   LYMPHSABS 1.8 04/16/2018 0943   MONOABS 0.8 12/02/2018 0809   EOSABS 0.1 12/02/2018 0809   EOSABS 0.2 04/16/2018 0943   BASOSABS 0.1 12/02/2018 0809   BASOSABS 0.1 04/16/2018 0943   Iron/TIBC/Ferritin/ %Sat No results found for: IRON, TIBC, FERRITIN, IRONPCTSAT Lipid Panel     Component Value Date/Time   CHOL 155 09/15/2018 0758   TRIG 86 09/15/2018 0758    HDL 39 (L) 09/15/2018 0758   CHOLHDL 3.8 12/02/2015 0212   VLDL 13 12/02/2015 0212   LDLCALC 99 09/15/2018 0758   Hepatic Function Panel     Component Value Date/Time   PROT 6.5 12/02/2018 0809   PROT 6.6 09/15/2018 0758   ALBUMIN 3.5 12/02/2018 0809   ALBUMIN 4.3 09/15/2018 0758   AST 14 (L) 12/02/2018 0809   ALT 11 12/02/2018 0809   ALKPHOS 105 12/02/2018 0809   BILITOT 0.7 12/02/2018 0809   BILITOT 0.6 09/15/2018 0758       OBESITY BEHAVIORAL INTERVENTION VISIT  Today's visit was # 72   Starting weight: 250 lbs Starting date: 04/16/18 Today's weight : 249 lbs  Today's date: 05/31/2019 Total lbs lost to date: 1 At least 15 minutes were spent on discussing the following behavioral intervention visit.   ASK: We discussed the diagnosis of obesity with Roger Shelter today and Belenda Cruise agreed to give [place] permission to discuss obesity behavioral modification therapy today.  ASSESS: Brandin has the diagnosis of obesity and his BMI today is 40.21 Jaxon is in the action stage of change   ADVISE: Tasean was educated on the multiple health risks of obesity as well as the benefit of weight loss to improve his health. He was advised of the need for long term treatment and the importance of lifestyle modifications to improve his current health and to decrease his risk of future health problems.  AGREE: Multiple dietary modification options and treatment options were discussed and  Raford agreed to follow the recommendations documented in the above note.  ARRANGE: Tracer was educated on the importance  of frequent visits to treat obesity as outlined per CMS and USPSTF guidelines and agreed to schedule his next follow up appointment today.  I, Burt Knack, am acting as transcriptionist for Quillian Quince, MD I have reviewed the above documentation for accuracy and completeness, and I agree with the above. -Quillian Quince, MD

## 2019-06-19 ENCOUNTER — Other Ambulatory Visit: Payer: Self-pay | Admitting: Internal Medicine

## 2019-06-24 ENCOUNTER — Other Ambulatory Visit: Payer: Self-pay | Admitting: Internal Medicine

## 2019-06-24 ENCOUNTER — Encounter (INDEPENDENT_AMBULATORY_CARE_PROVIDER_SITE_OTHER): Payer: Self-pay | Admitting: Family Medicine

## 2019-06-24 ENCOUNTER — Other Ambulatory Visit: Payer: Self-pay

## 2019-06-24 ENCOUNTER — Ambulatory Visit (INDEPENDENT_AMBULATORY_CARE_PROVIDER_SITE_OTHER): Payer: BC Managed Care – PPO | Admitting: Family Medicine

## 2019-06-24 VITALS — BP 127/78 | HR 80 | Temp 98.0°F | Ht 66.0 in | Wt 250.0 lb

## 2019-06-24 DIAGNOSIS — Z6841 Body Mass Index (BMI) 40.0 and over, adult: Secondary | ICD-10-CM

## 2019-06-24 DIAGNOSIS — R7303 Prediabetes: Secondary | ICD-10-CM | POA: Diagnosis not present

## 2019-06-24 DIAGNOSIS — E559 Vitamin D deficiency, unspecified: Secondary | ICD-10-CM

## 2019-06-24 DIAGNOSIS — E7849 Other hyperlipidemia: Secondary | ICD-10-CM | POA: Diagnosis not present

## 2019-06-25 LAB — HEMOGLOBIN A1C
Est. average glucose Bld gHb Est-mCnc: 123 mg/dL
Hgb A1c MFr Bld: 5.9 % — ABNORMAL HIGH (ref 4.8–5.6)

## 2019-06-25 LAB — COMPREHENSIVE METABOLIC PANEL
ALT: 12 IU/L (ref 0–44)
AST: 12 IU/L (ref 0–40)
Albumin/Globulin Ratio: 1.7 (ref 1.2–2.2)
Albumin: 4.3 g/dL (ref 3.8–4.8)
Alkaline Phosphatase: 137 IU/L — ABNORMAL HIGH (ref 39–117)
BUN/Creatinine Ratio: 21 (ref 10–24)
BUN: 22 mg/dL (ref 8–27)
Bilirubin Total: 0.2 mg/dL (ref 0.0–1.2)
CO2: 23 mmol/L (ref 20–29)
Calcium: 9 mg/dL (ref 8.6–10.2)
Chloride: 108 mmol/L — ABNORMAL HIGH (ref 96–106)
Creatinine, Ser: 1.03 mg/dL (ref 0.76–1.27)
GFR calc Af Amer: 88 mL/min/{1.73_m2} (ref >59)
GFR calc non Af Amer: 76 mL/min/{1.73_m2} (ref >59)
Globulin, Total: 2.5 g/dL (ref 1.5–4.5)
Glucose: 85 mg/dL (ref 65–99)
Potassium: 4.3 mmol/L (ref 3.5–5.2)
Sodium: 142 mmol/L (ref 134–144)
Total Protein: 6.8 g/dL (ref 6.0–8.5)

## 2019-06-25 LAB — CBC WITH DIFFERENTIAL/PLATELET
Basophils Absolute: 0.1 10*3/uL (ref 0.0–0.2)
Basos: 1 %
EOS (ABSOLUTE): 0.2 10*3/uL (ref 0.0–0.4)
Eos: 2 %
Hematocrit: 41.7 % (ref 37.5–51.0)
Hemoglobin: 13.9 g/dL (ref 13.0–17.7)
Immature Grans (Abs): 0 10*3/uL (ref 0.0–0.1)
Immature Granulocytes: 0 %
Lymphocytes Absolute: 1.9 10*3/uL (ref 0.7–3.1)
Lymphs: 16 %
MCH: 28.6 pg (ref 26.6–33.0)
MCHC: 33.3 g/dL (ref 31.5–35.7)
MCV: 86 fL (ref 79–97)
Monocytes Absolute: 1 10*3/uL — ABNORMAL HIGH (ref 0.1–0.9)
Monocytes: 9 %
Neutrophils Absolute: 8.2 10*3/uL — ABNORMAL HIGH (ref 1.4–7.0)
Neutrophils: 72 %
Platelets: 306 10*3/uL (ref 150–450)
RBC: 4.86 x10E6/uL (ref 4.14–5.80)
RDW: 13.4 % (ref 11.6–15.4)
WBC: 11.4 10*3/uL — ABNORMAL HIGH (ref 3.4–10.8)

## 2019-06-25 LAB — LIPID PANEL WITH LDL/HDL RATIO
Cholesterol, Total: 162 mg/dL (ref 100–199)
HDL: 37 mg/dL — ABNORMAL LOW (ref >39)
LDL Chol Calc (NIH): 101 mg/dL — ABNORMAL HIGH (ref 0–99)
LDL/HDL Ratio: 2.7 ratio (ref 0.0–3.6)
Triglycerides: 135 mg/dL (ref 0–149)
VLDL Cholesterol Cal: 24 mg/dL (ref 5–40)

## 2019-06-25 LAB — VITAMIN D 25 HYDROXY (VIT D DEFICIENCY, FRACTURES): Vit D, 25-Hydroxy: 32.6 ng/mL (ref 30.0–100.0)

## 2019-06-25 LAB — INSULIN, RANDOM: INSULIN: 19.7 u[IU]/mL (ref 2.6–24.9)

## 2019-06-27 NOTE — Progress Notes (Signed)
Office: [phone]  /  Fax: [phone]   HPI:   Chief Complaint: OBESITY Sean Winters is here to discuss his progress with his obesity treatment plan. He is on the lower carbohydrate, vegetable and lean protein rich diet plan and is following his eating plan approximately 50 % of the time. He states he is walking for 30 minutes 2 times per week. Sean Winters has done well with maintinaing his weight, but he is struggling to meal plan and has gotten off track. He would like to change to a plan where he can eat more pre-packaged things for convience.  His weight is 250 lb (113.4 kg) today and has gained 1 lb since his last visit. He has lost 0 lbs since starting treatment with Sean Winters.  Pre-Diabetes Sean Winters has a diagnosis of pre-diabetes based on his elevated Hgb A1c and was informed this puts him at greater risk of developing diabetes. He is stable on metformin and he is due for labs. He is struggling with eating and exercise.   Vitamin D Deficiency Sean Winters has a diagnosis of vitamin D deficiency. He is stable on prescription Vit D, but level is not yet at goal. He denies nausea, vomiting or muscle weakness.  Hyperlipidemia Sean Winters has hyperlipidemia and he is on Lipitor. He is working on his cholesterol levels with intensive lifestyle modification including a low saturated fat diet, exercise and weight loss. He denies any chest pain, claudication or myalgias.  ASSESSMENT AND PLAN:  Prediabetes - Plan: Comprehensive metabolic panel, Hemoglobin A1c, Insulin, random  Vitamin D deficiency - Plan: VITAMIN D 25 Hydroxy (Vit-D Deficiency, Fractures)  Other hyperlipidemia - Plan: CBC with Differential/Platelet, Lipid Panel With LDL/HDL Ratio  Class 3 severe obesity with serious comorbidity and body mass index (BMI) of 40.0 to 44.9 in adult, unspecified obesity type (HCC)  PLAN:  Pre-Diabetes Sean Winters will continue to work on weight loss, diet, exercise, and decreasing simple carbohydrates in his  diet to help decrease the risk of diabetes. We dicussed metformin including benefits and risks. He was informed that eating too many simple carbohydrates or too many calories at one sitting increases the likelihood of GI side effects. Sean Winters agrees to continue taking metformin, and we will check labs today. Sean Winters agrees to follow up with our clinic in 3 weeks as directed to monitor his progress.  Vitamin D Deficiency Sean Winters was informed that low vitamin D levels contributes to fatigue and are associated with obesity, breast, and colon cancer. Sean Winters agrees to continue taking prescription Vit D 50,000 IU every week and will follow up for routine testing of vitamin D, at least 2-3 times per year. He was informed of the risk of over-replacement of vitamin D and agrees to not increase his dose unless he discusses this with Sean Winters first. We will check labs today. Sean Winters agrees to follow up with our clinic in 3 weeks.  Hyperlipidemia Sean Winters was informed of the American Heart Association Guidelines emphasizing intensive lifestyle modifications as the first line treatment for hyperlipidemia. We discussed many lifestyle modifications today in depth, and Sean Winters will continue to work on decreasing saturated fats such as fatty red meat, butter and many fried foods. Sean Winters agrees to continue his medications, and he will also increase vegetables and lean protein in his diet and continue to work on diet, exercise, and weight loss efforts. We will check labs today. Sean Winters agrees to follow up with our clinic in 3 weeks.  Obesity Sean Winters is currently in the action stage of change. As such,  his goal is to continue with weight loss efforts He has agreed to change to keep a food journal with 1200-1500 calories and 80 grams of protein daily Sean Winters has been instructed to work up to a goal of 150 minutes of combined cardio and strengthening exercise per week for weight loss and overall health benefits. We discussed the  following Behavioral Modification Strategies today: work on meal planning and easy cooking plans and keep a strict food journal   Sean Winters has agreed to follow up with our clinic in 3 weeks. He was informed of the importance of frequent follow up visits to maximize his success with intensive lifestyle modifications for his multiple health conditions.  ALLERGIES: Allergies  Allergen Reactions   Haloperidol Other (See Comments)    Fatigue   Ibuprofen Other (See Comments)    Cannot take while on lithium   Sulfa Antibiotics Rash   Sulfamethoxazole Rash    MEDICATIONS: Current Outpatient Medications on File Prior to Visit  Medication Sig Dispense Refill   ASHWAGANDHA PO Take 250 mg by mouth once.     aspirin EC 81 MG tablet Take 81 mg by mouth daily.     atorvastatin (LIPITOR) 20 MG tablet Take 20 mg by mouth Daily.      lamoTRIgine (LAMICTAL) 25 MG tablet Take 75 mg by mouth daily.  4   lithium carbonate (LITHOBID) 300 MG CR tablet Take 900 mg by mouth at bedtime.  4   metFORMIN (GLUCOPHAGE) 500 MG tablet Take 1 tablet (500 mg total) by mouth 2 (two) times daily with a meal. 60 tablet 0   OVER THE COUNTER MEDICATION Take 3 capsules by mouth daily. CocoaVia- for memory and blood flow     OVER THE COUNTER MEDICATION Take 2 tablets by mouth daily. For memory - Sensoril 250 mg tablets     oxybutynin (DITROPAN-XL) 10 MG 24 hr tablet Take 10 mg by mouth daily.     potassium chloride SA (KLOR-CON) 20 MEQ tablet Take 2 tablets (40 mEq total) by mouth daily. Please keep f/u on 07/21/19 to continue refills. Thanks. 180 tablet 0   pramipexole (MIRAPEX) 1.5 MG tablet Take 1 mg by mouth at bedtime.      Vitamin D, Ergocalciferol, (DRISDOL) 1.25 MG (50000 UT) CAPS capsule Take 1 capsule (50,000 Units total) by mouth every 7 (seven) days. 4 capsule 0   No current facility-administered medications on file prior to visit.     PAST MEDICAL HISTORY: Past Medical History:  Diagnosis Date     Anxiety    Bipolar disorder ([place])    BPH (benign prostatic hypertrophy)    Chest pain    Complete heart block ([place])    a. s/p Medtronic PPM 2017   Cystitis, acute    Depression    Diverticulitis    Dyslipidemia    Dyspnea    Edema    Lumbago    Nephrolithiasis    Pulmonary embolism ([place]) 10/2009    PAST SURGICAL HISTORY: Past Surgical History:  Procedure Laterality Date   EP IMPLANTABLE DEVICE N/A 12/04/2015   Procedure: Pacemaker Implant;  Surgeon: Evans Lance, MD;  Location: [facility];  Service: Cardiovascular;  Laterality: N/A;   FINGER FRACTURE SURGERY  2004   KIDNEY STONES     REMOVAL    KNEE ARTHROSCOPY     RIGHT KNEE   TONSILLECTOMY      SOCIAL HISTORY: Social History   Tobacco Use   Smoking status: Former Smoker  Packs/day: 1.00    Years: 18.00    Pack years: 18.00    Types: Cigarettes    Quit date: 09/03/1987    Years since quitting: 31.8   Smokeless tobacco: Never Used  Substance Use Topics   Alcohol use: No   Drug use: No    FAMILY HISTORY: Family History  Problem Relation Age of Onset   Heart attack Father 33   Hyperlipidemia Father    Diabetes Father    Hypertension Father    Heart disease Father    Fibromyalgia Sister    Stroke Mother    Arrhythmia Mother        atrial fib.   Depression Mother    Anxiety disorder Mother    Bipolar disorder Mother     ROS: Review of Systems  Constitutional: Negative for weight loss.  Cardiovascular: Negative for chest pain and claudication.  Gastrointestinal: Negative for nausea and vomiting.  Musculoskeletal: Negative for myalgias.       Negative muscle weakness    PHYSICAL EXAM: Blood pressure 127/78, pulse 80, temperature 98 F (36.7 C), temperature source Oral, height 5\' 6"  (1.676 m), weight 250 lb (113.4 kg), SpO2 92 %. Body mass index is 40.35 kg/m. Physical Exam Vitals signs reviewed.  Constitutional:      Appearance: Normal appearance.  He is obese.  Cardiovascular:     Rate and Rhythm: Normal rate.     Pulses: Normal pulses.  Pulmonary:     Effort: Pulmonary effort is normal.     Breath sounds: Normal breath sounds.  Musculoskeletal: Normal range of motion.  Skin:    General: Skin is warm and dry.  Neurological:     Mental Status: He is alert and oriented to person, place, and time.  Psychiatric:        Mood and Affect: Mood normal.        Behavior: Behavior normal.     RECENT LABS AND TESTS: BMET    Component Value Date/Time   NA 142 06/24/2019 0800   K 4.3 06/24/2019 0800   CL 108 (H) 06/24/2019 0800   CO2 23 06/24/2019 0800   GLUCOSE 85 06/24/2019 0800   GLUCOSE 99 12/02/2018 0809   BUN 22 06/24/2019 0800   CREATININE 1.03 06/24/2019 0800   CALCIUM 9.0 06/24/2019 0800   GFRNONAA 76 06/24/2019 0800   GFRAA 88 06/24/2019 0800   Lab Results  Component Value Date   HGBA1C 5.9 (H) 06/24/2019   HGBA1C 5.9 (H) 09/15/2018   HGBA1C 5.9 (H) 04/16/2018   HGBA1C 5.8 (H) 12/02/2015   HGBA1C 5.4 03/14/2012   Lab Results  Component Value Date   INSULIN 19.7 06/24/2019   INSULIN 11.3 09/15/2018   INSULIN 15.2 04/16/2018   CBC    Component Value Date/Time   WBC 11.4 (H) 06/24/2019 0800   WBC 11.8 (H) 12/02/2018 0809   RBC 4.86 06/24/2019 0800   RBC 4.76 12/02/2018 0809   HGB 13.9 06/24/2019 0800   HCT 41.7 06/24/2019 0800   PLT 306 06/24/2019 0800   MCV 86 06/24/2019 0800   MCH 28.6 06/24/2019 0800   MCH 27.5 12/02/2018 0809   MCHC 33.3 06/24/2019 0800   MCHC 30.5 12/02/2018 0809   RDW 13.4 06/24/2019 0800   LYMPHSABS 1.9 06/24/2019 0800   MONOABS 0.8 12/02/2018 0809   EOSABS 0.2 06/24/2019 0800   BASOSABS 0.1 06/24/2019 0800   Iron/TIBC/Ferritin/ %Sat No results found for: IRON, TIBC, FERRITIN, IRONPCTSAT Lipid Panel     Component  Value Date/Time   CHOL 162 06/24/2019 0800   TRIG 135 06/24/2019 0800   HDL 37 (L) 06/24/2019 0800   CHOLHDL 3.8 12/02/2015 0212   VLDL 13 12/02/2015 0212    LDLCALC 101 (H) 06/24/2019 0800   Hepatic Function Panel     Component Value Date/Time   PROT 6.8 06/24/2019 0800   ALBUMIN 4.3 06/24/2019 0800   AST 12 06/24/2019 0800   ALT 12 06/24/2019 0800   ALKPHOS 137 (H) 06/24/2019 0800   BILITOT 0.2 06/24/2019 0800       OBESITY BEHAVIORAL INTERVENTION VISIT  Today's visit was # 20   Starting weight: 250 lbs Starting date: 04/16/18 Today's weight : 250 lbs Today's date: 06/24/2019 Total lbs lost to date: 0 At least 15 minutes were spent on discussing the following behavioral intervention visit.   ASK: We discussed the diagnosis of obesity with Sean Winters today and Sean Winters agreed to give [place] permission to discuss obesity behavioral modification therapy today.  ASSESS: Sean Winters has the diagnosis of obesity and his BMI today is 40.37 Sean Winters is in the action stage of change   ADVISE: Sean Winters was educated on the multiple health risks of obesity as well as the benefit of weight loss to improve his health. He was advised of the need for long term treatment and the importance of lifestyle modifications to improve his current health and to decrease his risk of future health problems.  AGREE: Multiple dietary modification options and treatment options were discussed and  Aydrien agreed to follow the recommendations documented in the above note.  ARRANGE: Sean Winters was educated on the importance of frequent visits to treat obesity as outlined per CMS and USPSTF guidelines and agreed to schedule his next follow up appointment today.  I, Burt Knack, am acting as transcriptionist for Quillian Quince, MD  I have reviewed the above documentation for accuracy and completeness, and I agree with the above. -Quillian Quince, MD

## 2019-07-08 ENCOUNTER — Encounter: Payer: Self-pay | Admitting: *Deleted

## 2019-07-10 IMAGING — CT CT KNEE*L* W/CM
1 series · 12 of 14 positions shown, 15 images · non-contrast
Comparison: None.

CLINICAL DATA: Acute left knee pain.  Popping.

EXAM:
CT ARTHROGRAPHY OF THE LEFT KNEE
TECHNIQUE: Multidetector CT imaging was performed following the standard
protocol after injection of dilute contrast into the joint.

[Series 4: knee soft tissue · axial · 0.40mm/px · z∈[-310,-144]mm · 12 of 65 slices shown, 15 images]
[im 5/65  soft-tissue]
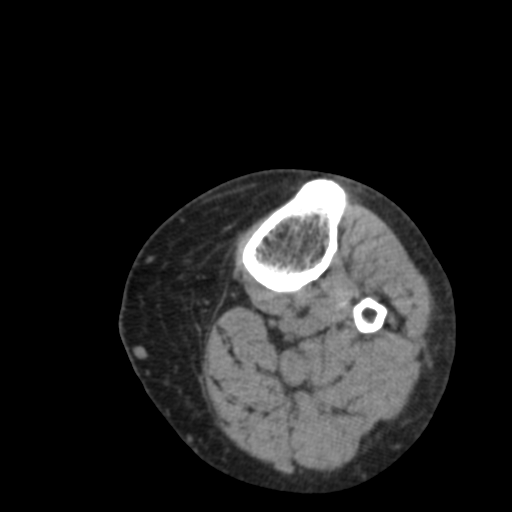
[im 5/65  bone]
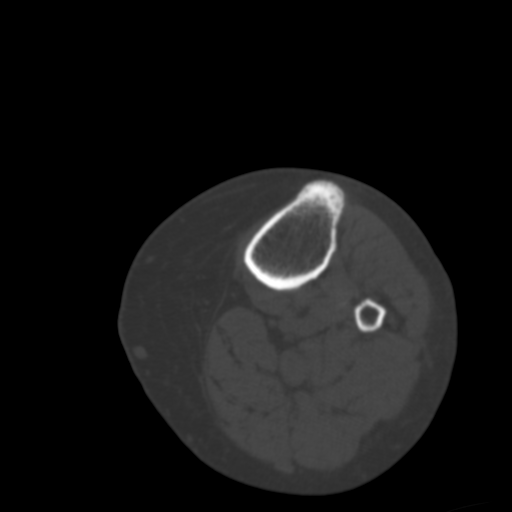
[im 10/65  bone]
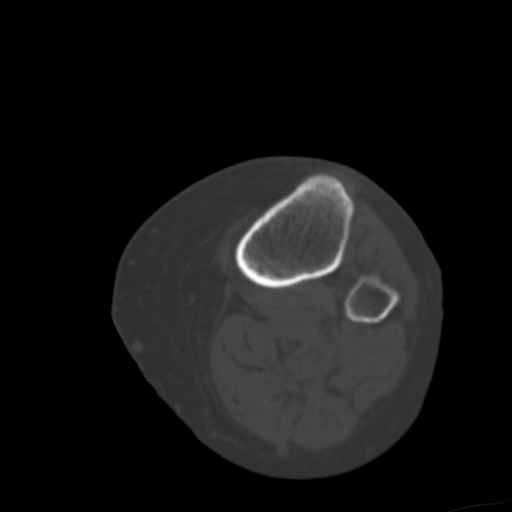
[im 15/65  bone]
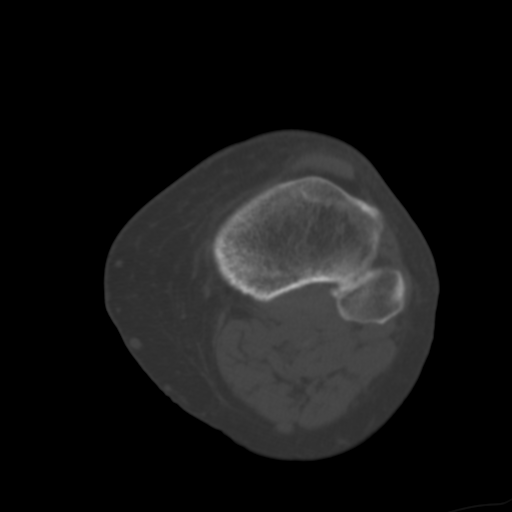
[im 20/65  bone]
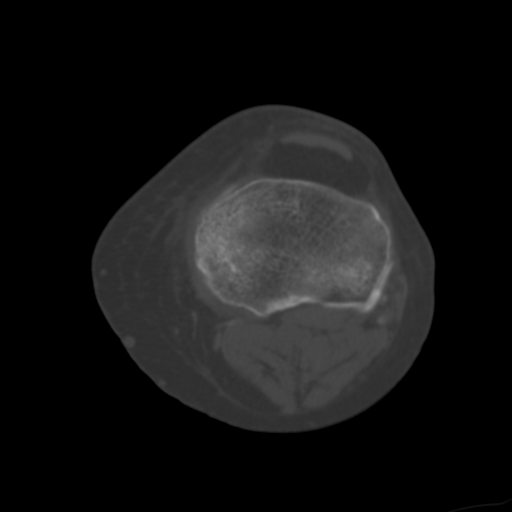
[im 25/65  soft-tissue]
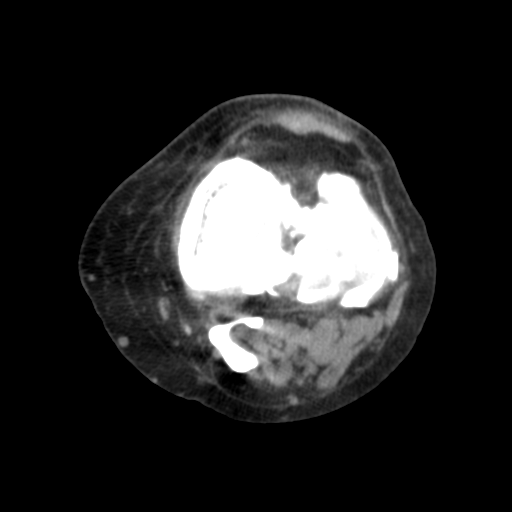
[im 25/65  bone]
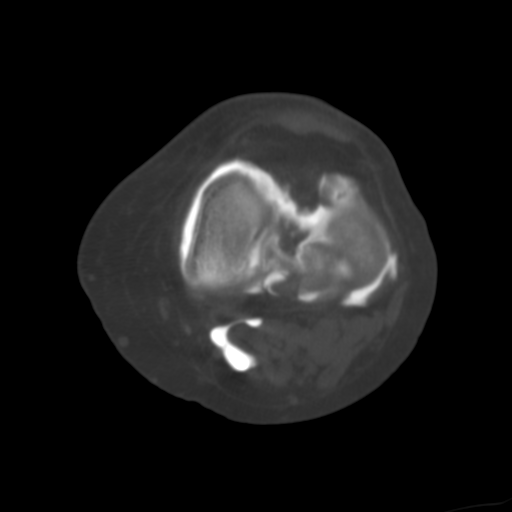
[im 30/65  bone]
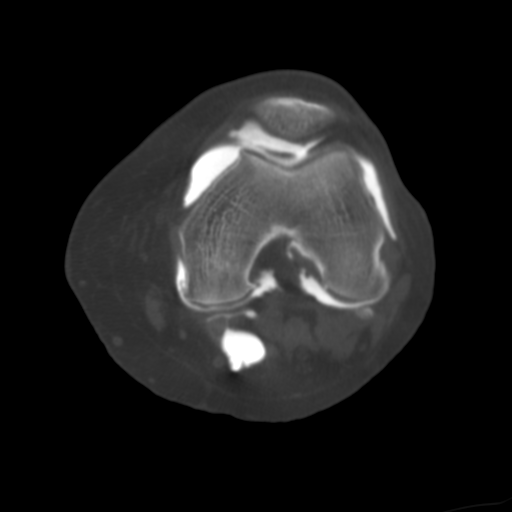
[im 35/65  bone]
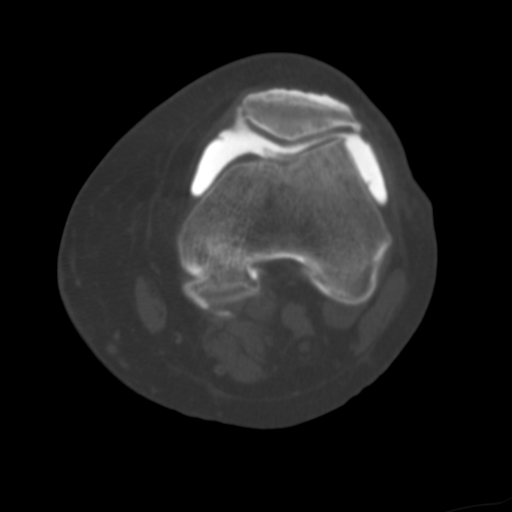
[im 40/65  bone]
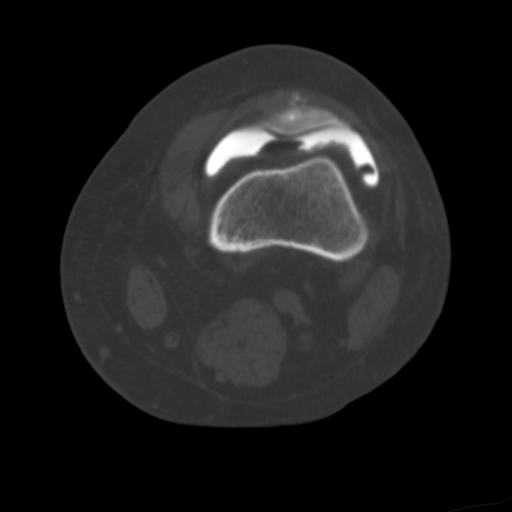
[im 45/65  soft-tissue]
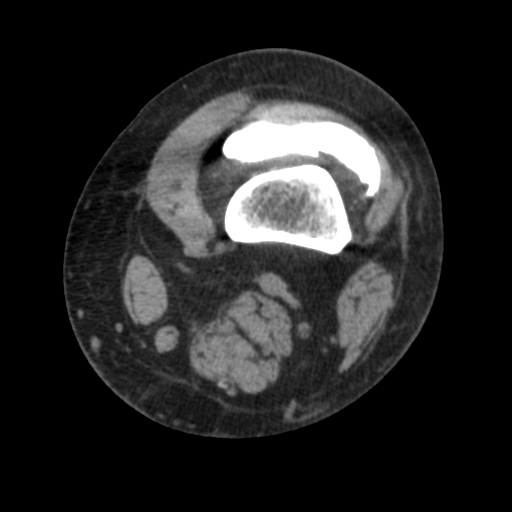
[im 45/65  bone]
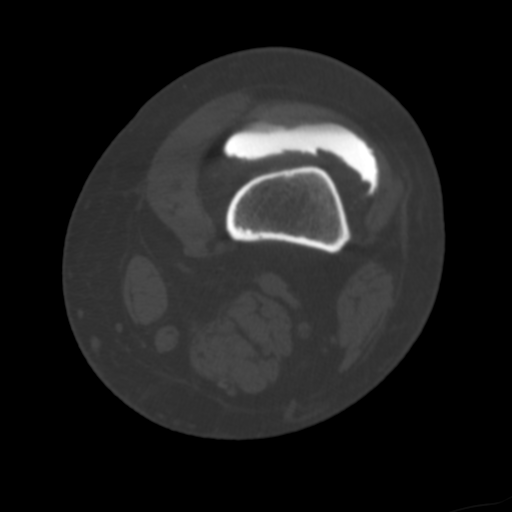
[im 50/65  bone]
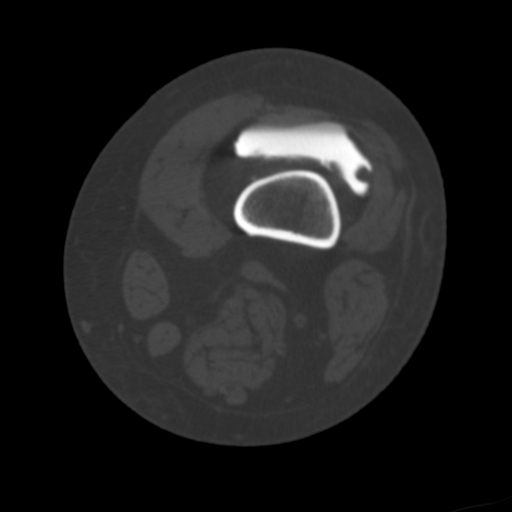
[im 55/65  bone]
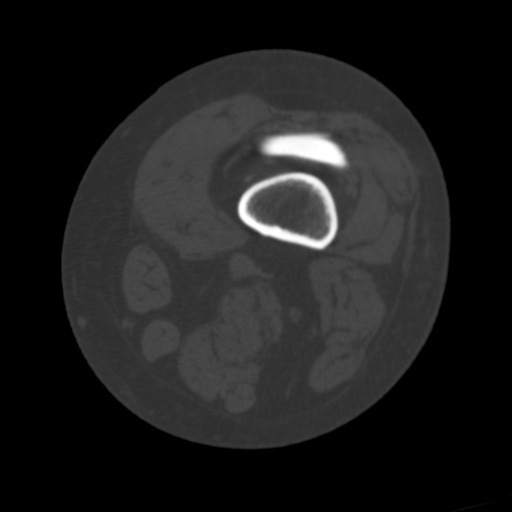
[im 60/65  bone]
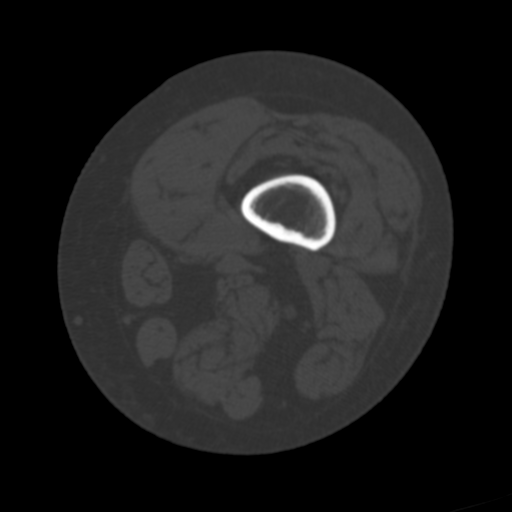

[12 of 14 positions shown; findings below may reference images not displayed]

FINDINGS: Menisci: Medial: There is a focal small free edge incomplete radial
tear of the midbody.

Lateral:  Intact.

Ligaments:  Cruciates:  Intact.  Collaterals:  Appear intact.

Cartilage: Patellofemoral: Small partial-thickness fissure in the
trochlear groove of the distal femur.

Medial: Diffuse partial-thickness cartilage loss, most irregular on
the medial femoral condyle.

Lateral: Focal 3 mm wide fissure in the posterior central aspect of
the femoral condyle.

Popliteal space:  4.6 x 2.4 x 1.4 cm Baker's cyst.

Extensor mechanism: Calcifications in the distal quadriceps tendon
and in the proximal patellar tendon. The tendons are intact.
IMPRESSION: 1. Small focal free edge incomplete radial tear of the midbody of
the medial meniscus.
2. Partial thickness cartilage loss in all 3 compartments, most
apparent in the medial compartment.

## 2019-07-12 ENCOUNTER — Encounter: Payer: Self-pay | Admitting: Pulmonary Disease

## 2019-07-12 ENCOUNTER — Other Ambulatory Visit: Payer: Self-pay

## 2019-07-12 ENCOUNTER — Ambulatory Visit (INDEPENDENT_AMBULATORY_CARE_PROVIDER_SITE_OTHER): Payer: BC Managed Care – PPO | Admitting: Pulmonary Disease

## 2019-07-12 DIAGNOSIS — R0602 Shortness of breath: Secondary | ICD-10-CM

## 2019-07-12 DIAGNOSIS — G4710 Hypersomnia, unspecified: Secondary | ICD-10-CM

## 2019-07-12 NOTE — Progress Notes (Signed)
Subjective:    Patient ID: Sean Winters, male    DOB: June 24, 1956, 63 y.o.   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  He also reports dyspnea on exertion for the past 2 months  Loud snoring has been noted by his wife was moved to a different room. Epworth sleepiness score is 12 and he reports sleepiness as a passenger in a car lying down to rest in the afternoons, sitting and reading or watching TV.  Bedtime is around 11 PM, sleep latency is minimal, he takes lithium and pramipexole at bedtime and Lamictal in the day, sleeps on his side with 1 pillow but needs his legs elevated due to edema and then will sleep on his back.  He reports 2-3 nocturnal awakenings including nocturia and is out of bed by 6 AM feeling tired with dryness of mouth but denies headaches. He has gained 48 pounds since his sleep study in 2011 There is no history suggestive of cataplexy, sleep paralysis or parasomnias  PMH -bipolar disorder, bilateral pulmonary bothersome 10/2009, maintained on anticoagulation for 4 years and then switched to aspirin daily  Reports shortness of breath for 2 months, declines seasonal allergies, no wheezing no change in his chronic pedal edema, denies orthopnea or paroxysmal nocturnal dyspnea or chest pain on exertion.  He had a fall and had an ED visit 12/2018, I personally reviewed chest x-ray from from there which shows cardiomegaly and pacemaker in position  On ambulation, heart rate went up from 10 8-1 1 5  but oxygen saturation stayed stable at 95 to 96%  Significant tests/ events reviewed  11/2015 CT angio chest negative for PE 12/2017 venous duplex bilateral negative for DVT  06/2010 NPSG-2 1 0 pounds -AHI 1.4/hour, desaturation to 86%, few PLM's with associated arousals 2/h  Past Medical History:  Diagnosis Date  . Anxiety   . Bipolar disorder ([place])   . BPH (benign prostatic hypertrophy)   .  Chest pain   . Complete heart block ([place])    a. s/p Medtronic PPM 2017  . Cystitis, acute   . Depression   . Diverticulitis   . Dyslipidemia   . Dyspnea   . Edema   . Lumbago   . Nephrolithiasis   . Pulmonary embolism ([place]) 10/2009    Past Surgical History:  Procedure Laterality Date  . EP IMPLANTABLE DEVICE N/A 12/04/2015   Procedure: Pacemaker Implant;  Surgeon: Evans Lance, MD;  Location: [facility];  Service: Cardiovascular;  Laterality: N/A;  . FINGER FRACTURE SURGERY  2004  . KIDNEY STONES     REMOVAL   . KNEE ARTHROSCOPY     RIGHT KNEE  . TONSILLECTOMY      Allergies  Allergen Reactions  . Haloperidol Other (See Comments)    Fatigue  . Ibuprofen Other (See Comments)    Cannot take while on lithium  . Sulfa Antibiotics Rash  . Sulfamethoxazole Rash    Social History   Socioeconomic History  . Marital status: Married    Spouse name: Luisantonio Adinolfi  . Number of children: 2  . Years of education: Not on file  . Highest education level: Not on file  Occupational History  . Occupation: Transport planner  Social Needs  . Financial resource strain: Not on file  . Food insecurity    Worry: Not on file    Inability: Not on file  . Transportation needs  Medical: Not on file    Non-medical: Not on file  Tobacco Use  . Smoking status: Former Smoker    Packs/day: 1.00    Years: 18.00    Pack years: 18.00    Types: Cigarettes    Quit date: 09/03/1987    Years since quitting: 31.8  . Smokeless tobacco: Never Used  Substance and Sexual Activity  . Alcohol use: No  . Drug use: No  . Sexual activity: Not on file  Lifestyle  . Physical activity    Days per week: Not on file    Minutes per session: Not on file  . Stress: Not on file  Relationships  . Social Musician on phone: Not on file    Gets together: Not on file    Attends religious service: Not on file    Active member of club or organization: Not on file    Attends  meetings of clubs or organizations: Not on file    Relationship status: Not on file  . Intimate partner violence    Fear of current or ex partner: Not on file    Emotionally abused: Not on file    Physically abused: Not on file    Forced sexual activity: Not on file  Other Topics Concern  . Not on file  Social History Narrative  . Not on file      Family History  Problem Relation Age of Onset  . Heart attack Father 16  . Hyperlipidemia Father   . Diabetes Father   . Hypertension Father   . Heart disease Father   . Fibromyalgia Sister   . Stroke Mother   . Arrhythmia Mother        atrial fib.  . Depression Mother   . Anxiety disorder Mother   . Bipolar disorder Mother     Review of Systems  Respiratory: Positive for shortness of breath.    Constitutional: negative for anorexia, fevers and sweats  Eyes: negative for irritation, redness and visual disturbance  Ears, nose, mouth, throat, and face: negative for earaches, epistaxis, nasal congestion and sore throat  Respiratory: negative for cough, sputum and wheezing  Cardiovascular: negative for chest pain,  orthopnea, palpitations and syncope  Gastrointestinal: negative for abdominal pain, constipation, diarrhea, melena, nausea and vomiting  Genitourinary:negative for dysuria, frequency and hematuria  Hematologic/lymphatic: negative for bleeding, easy bruising and lymphadenopathy  Musculoskeletal:negative for arthralgias, muscle weakness and stiff joints  Neurological: negative for coordination problems, gait problems, headaches and weakness  Endocrine: negative for diabetic symptoms including polydipsia, polyuria and weight loss      Objective:   Physical Exam  Gen. Pleasant, obese, in no distress, normal affect ENT - no pallor,icterus, no post nasal drip, class 2-3 airway Neck: No JVD, no thyromegaly, no carotid bruits Lungs: no use of accessory muscles, no dullness to percussion, bibasal rales no rhonchi   Cardiovascular: Rhythm regular, heart sounds  normal, no murmurs or gallops, no peripheral edema Abdomen: soft and non-tender, no hepatosplenomegaly, BS normal. Musculoskeletal: No deformities, no cyanosis or clubbing Neuro:  alert, non focal, no tremors       Assessment & Plan:

## 2019-07-12 NOTE — Patient Instructions (Signed)
Schedule home sleep test.  Check D-dimer -based on this we will order the appropriate scan for you

## 2019-07-12 NOTE — Assessment & Plan Note (Signed)
Given excessive daytime somnolence, narrow pharyngeal exam, witnessed apneas & loud snoring, obstructive sleep apnea is very likely & an overnight polysomnogram will be scheduled as a home study. The pathophysiology of obstructive sleep apnea , it's cardiovascular consequences & modes of treatment including CPAP were discused with the patient in detail & they evidenced understanding.  Pretest probability is high.  He has gained significant weight since his study in 2011

## 2019-07-12 NOTE — Assessment & Plan Note (Signed)
Associated with bibasilar crackles raises the question of ILD although CT angiogram in 11/2015 and chest x-ray 12/2018 did not suggest this.  In the background is the high risk for VTE although symptoms have been ongoing for 2 months. We will check D-dimer for completion If this is negative then we will proceed with HRCT to rule out ILD

## 2019-07-13 ENCOUNTER — Other Ambulatory Visit: Payer: Medicare Other

## 2019-07-13 DIAGNOSIS — R0602 Shortness of breath: Secondary | ICD-10-CM

## 2019-07-13 LAB — D-DIMER, QUANTITATIVE: D-Dimer, Quant: 0.89 mcg/mL FEU — ABNORMAL HIGH (ref <0.50)

## 2019-07-15 ENCOUNTER — Other Ambulatory Visit: Payer: Self-pay

## 2019-07-15 ENCOUNTER — Ambulatory Visit (HOSPITAL_BASED_OUTPATIENT_CLINIC_OR_DEPARTMENT_OTHER)
Admission: RE | Admit: 2019-07-15 | Discharge: 2019-07-15 | Disposition: A | Discharge status: Home / Self Care | Payer: BC Managed Care – PPO | Source: Ambulatory Visit | Attending: Pulmonary Disease | Admitting: Pulmonary Disease

## 2019-07-15 ENCOUNTER — Other Ambulatory Visit: Payer: Self-pay | Admitting: General Surgery

## 2019-07-15 ENCOUNTER — Encounter (HOSPITAL_BASED_OUTPATIENT_CLINIC_OR_DEPARTMENT_OTHER): Payer: Self-pay

## 2019-07-15 DIAGNOSIS — R0602 Shortness of breath: Secondary | ICD-10-CM | POA: Diagnosis present

## 2019-07-15 MED ORDER — IOHEXOL 350 MG/ML SOLN
100.0000 mL | Freq: Once | INTRAVENOUS | Status: AC | PRN
  Administered 2019-07-15: 100 mL via INTRAVENOUS

## 2019-07-19 ENCOUNTER — Other Ambulatory Visit: Payer: Self-pay

## 2019-07-19 ENCOUNTER — Encounter (INDEPENDENT_AMBULATORY_CARE_PROVIDER_SITE_OTHER): Payer: Self-pay | Admitting: Family Medicine

## 2019-07-19 ENCOUNTER — Ambulatory Visit (INDEPENDENT_AMBULATORY_CARE_PROVIDER_SITE_OTHER): Payer: BC Managed Care – PPO | Admitting: Family Medicine

## 2019-07-19 VITALS — BP 110/68 | HR 75 | Temp 97.7°F | Ht 66.0 in | Wt 251.0 lb

## 2019-07-19 DIAGNOSIS — Z6841 Body Mass Index (BMI) 40.0 and over, adult: Secondary | ICD-10-CM

## 2019-07-19 DIAGNOSIS — R7303 Prediabetes: Secondary | ICD-10-CM | POA: Diagnosis not present

## 2019-07-20 NOTE — Progress Notes (Signed)
Office: [phone]  /  Fax: [phone]   HPI:   Chief Complaint: OBESITY Sean Winters is here to discuss his progress with his obesity treatment plan. He is on the keep a food journal with 1200-1500 calories and 80 grams of protein daily and is following his eating plan approximately 30 % of the time. He states he is walking the dog for 20 minutes 4-5 times per week. Sean Winters has been very busy with work and school, and has eaten out at [employer] more often.  His weight is 251 lb (113.9 kg) today and has gained 1 lb since his last visit. He has lost 0 lbs since starting treatment with [place].  Pre-Diabetes Sean Winters has a diagnosis of pre-diabetes based on his elevated Hgb A1c and was informed this puts him at greater risk of developing diabetes. Last A1c was stable at 5.9, but has not improved due to struggling with his diet. He is taking metformin currently and continues to work on diet and exercise to decrease risk of diabetes. He denies nausea, vomiting, or hypoglycemia. We discussed recent lab results today.  ASSESSMENT AND PLAN:  Prediabetes  Class 3 severe obesity with serious comorbidity and body mass index (BMI) of 40.0 to 44.9 in adult, unspecified obesity type (HCC)  PLAN:  Pre-Diabetes Sean Winters will continue to work on weight loss, diet, exercise, and decreasing simple carbohydrates in his diet to help decrease the risk of diabetes. We dicussed metformin including benefits and risks. He was informed that eating too many simple carbohydrates or too many calories at one sitting increases the likelihood of GI side effects. Sean Winters agrees to follow up with [place] as directed to monitor his progress.  I spent > than 50% of the 25 minute visit on counseling as documented in the note.  Obesity Sean Winters is currently in the action stage of change. As such, his goal is to continue with weight loss efforts He has agreed to keep a food journal with 1200-1500 calories and 80+ grams of protein daily  or follow the Category 3 plan Sean Winters can change back to Category 3 or journaling Sean Winters has been instructed to work up to a goal of 150 minutes of combined cardio and strengthening exercise per week for weight loss and overall health benefits. We discussed the following Behavioral Modification Strategies today: increasing lean protein intake, decreasing simple carbohydrates , holiday eating strategies  and emotional eating strategies   Sean Winters has agreed to follow up with our clinic in 3 to 4 weeks. He was informed of the importance of frequent follow up visits to maximize his success with intensive lifestyle modifications for his multiple health conditions.  ALLERGIES: Allergies  Allergen Reactions  . Haloperidol Other (See Comments)    Fatigue  . Ibuprofen Other (See Comments)    Cannot take while on lithium  . Sulfa Antibiotics Rash  . Sulfamethoxazole Rash    MEDICATIONS: Current Outpatient Medications on File Prior to Visit  Medication Sig Dispense Refill  . ASHWAGANDHA PO Take 250 mg by mouth once.    Sean Winters aspirin EC 81 MG tablet Take 81 mg by mouth daily.    Sean Winters atorvastatin (LIPITOR) 20 MG tablet Take 20 mg by mouth Daily.     . furosemide (LASIX) 40 MG tablet Take 2 tablets (80 mg total) by mouth daily. Please keep upcoming appt in November before anymore refills. Thank you 180 tablet 0  . lamoTRIgine (LAMICTAL) 25 MG tablet Take 75 mg by mouth daily.  4  .  lithium carbonate (LITHOBID) 300 MG CR tablet Take 900 mg by mouth at bedtime.  4  . metFORMIN (GLUCOPHAGE) 500 MG tablet Take 1 tablet (500 mg total) by mouth 2 (two) times daily with a meal. 60 tablet 0  . OVER THE COUNTER MEDICATION Take 3 capsules by mouth daily. CocoaVia- for memory and blood flow    . OVER THE COUNTER MEDICATION Take 2 tablets by mouth daily. For memory - Sensoril 250 mg tablets    . oxybutynin (DITROPAN-XL) 10 MG 24 hr tablet Take 10 mg by mouth daily.    . potassium chloride SA (KLOR-CON) 20 MEQ  tablet Take 2 tablets (40 mEq total) by mouth daily. Please keep f/u on 07/21/19 to continue refills. Thanks. 180 tablet 0  . pramipexole (MIRAPEX) 1.5 MG tablet Take 1 mg by mouth at bedtime.     . Vitamin D, Ergocalciferol, (DRISDOL) 1.25 MG (50000 UT) CAPS capsule Take 1 capsule (50,000 Units total) by mouth every 7 (seven) days. 4 capsule 0   No current facility-administered medications on file prior to visit.     PAST MEDICAL HISTORY: Past Medical History:  Diagnosis Date  . Anxiety   . Bipolar disorder (HCC)   . BPH (benign prostatic hypertrophy)   . Chest pain   . Complete heart block (HCC)    a. s/p Medtronic PPM 2017  . Cystitis, acute   . Depression   . Diverticulitis   . Dyslipidemia   . Dyspnea   . Edema   . Lumbago   . Nephrolithiasis   . Pulmonary embolism (HCC) 10/2009    PAST SURGICAL HISTORY: Past Surgical History:  Procedure Laterality Date  . EP IMPLANTABLE DEVICE N/A 12/04/2015   Procedure: Pacemaker Implant;  Surgeon: Marinus Maw, MD;  Location: [facility] INVASIVE CV LAB;  Service: Cardiovascular;  Laterality: N/A;  . FINGER FRACTURE SURGERY  2004  . KIDNEY STONES     REMOVAL   . KNEE ARTHROSCOPY     RIGHT KNEE  . TONSILLECTOMY      SOCIAL HISTORY: Social History   Tobacco Use  . Smoking status: Former Smoker    Packs/day: 1.00    Years: 18.00    Pack years: 18.00    Types: Cigarettes    Quit date: 09/03/1987    Years since quitting: 31.8  . Smokeless tobacco: Never Used  Substance Use Topics  . Alcohol use: No  . Drug use: No    FAMILY HISTORY: Family History  Problem Relation Age of Onset  . Heart attack Father 15  . Hyperlipidemia Father   . Diabetes Father   . Hypertension Father   . Heart disease Father   . Fibromyalgia Sister   . Stroke Mother   . Arrhythmia Mother        atrial fib.  . Depression Mother   . Anxiety disorder Mother   . Bipolar disorder Mother     ROS: Review of Systems  Constitutional: Negative for weight  loss.  Gastrointestinal: Negative for nausea and vomiting.  Endo/Heme/Allergies:       Negative hypoglycemia    PHYSICAL EXAM: Blood pressure 110/68, pulse 75, temperature 97.7 F (36.5 C), temperature source Oral, height 5\' 6"  (1.676 m), weight 251 lb (113.9 kg), SpO2 94 %. Body mass index is 40.51 kg/m. Physical Exam Vitals signs reviewed.  Constitutional:      Appearance: Normal appearance. He is obese.  Cardiovascular:     Rate and Rhythm: Normal rate.  Pulses: Normal pulses.  Pulmonary:     Effort: Pulmonary effort is normal.     Breath sounds: Normal breath sounds.  Musculoskeletal: Normal range of motion.  Skin:    General: Skin is warm and dry.  Neurological:     Mental Status: He is alert and oriented to person, place, and time.  Psychiatric:        Mood and Affect: Mood normal.        Behavior: Behavior normal.     RECENT LABS AND TESTS: BMET    Component Value Date/Time   NA 142 06/24/2019 0800   K 4.3 06/24/2019 0800   CL 108 (H) 06/24/2019 0800   CO2 23 06/24/2019 0800   GLUCOSE 85 06/24/2019 0800   GLUCOSE 99 12/02/2018 0809   BUN 22 06/24/2019 0800   CREATININE 1.03 06/24/2019 0800   CALCIUM 9.0 06/24/2019 0800   GFRNONAA 76 06/24/2019 0800   GFRAA 88 06/24/2019 0800   Lab Results  Component Value Date   HGBA1C 5.9 (H) 06/24/2019   HGBA1C 5.9 (H) 09/15/2018   HGBA1C 5.9 (H) 04/16/2018   HGBA1C 5.8 (H) 12/02/2015   HGBA1C 5.4 03/14/2012   Lab Results  Component Value Date   INSULIN 19.7 06/24/2019   INSULIN 11.3 09/15/2018   INSULIN 15.2 04/16/2018   CBC    Component Value Date/Time   WBC 11.4 (H) 06/24/2019 0800   WBC 11.8 (H) 12/02/2018 0809   RBC 4.86 06/24/2019 0800   RBC 4.76 12/02/2018 0809   HGB 13.9 06/24/2019 0800   HCT 41.7 06/24/2019 0800   PLT 306 06/24/2019 0800   MCV 86 06/24/2019 0800   MCH 28.6 06/24/2019 0800   MCH 27.5 12/02/2018 0809   MCHC 33.3 06/24/2019 0800   MCHC 30.5 12/02/2018 0809   RDW 13.4  06/24/2019 0800   LYMPHSABS 1.9 06/24/2019 0800   MONOABS 0.8 12/02/2018 0809   EOSABS 0.2 06/24/2019 0800   BASOSABS 0.1 06/24/2019 0800   Iron/TIBC/Ferritin/ %Sat No results found for: IRON, TIBC, FERRITIN, IRONPCTSAT Lipid Panel     Component Value Date/Time   CHOL 162 06/24/2019 0800   TRIG 135 06/24/2019 0800   HDL 37 (L) 06/24/2019 0800   CHOLHDL 3.8 12/02/2015 0212   VLDL 13 12/02/2015 0212   LDLCALC 101 (H) 06/24/2019 0800   Hepatic Function Panel     Component Value Date/Time   PROT 6.8 06/24/2019 0800   ALBUMIN 4.3 06/24/2019 0800   AST 12 06/24/2019 0800   ALT 12 06/24/2019 0800   ALKPHOS 137 (H) 06/24/2019 0800   BILITOT 0.2 06/24/2019 0800       OBESITY BEHAVIORAL INTERVENTION VISIT  Today's visit was # 21   Starting weight: 250 lbs Starting date: 04/16/18 Today's weight : 251 lbs Today's date: 07/19/2019 Total lbs lost to date: 0    ASK: We discussed the diagnosis of obesity with Roger Shelter today and Belenda Cruise agreed to give [place] permission to discuss obesity behavioral modification therapy today.  ASSESS: Ammon has the diagnosis of obesity and his BMI today is 40.53 Eragon is in the action stage of change   ADVISE: Cinsere was educated on the multiple health risks of obesity as well as the benefit of weight loss to improve his health. He was advised of the need for long term treatment and the importance of lifestyle modifications to improve his current health and to decrease his risk of future health problems.  AGREE: Multiple dietary modification options and treatment options  were discussed and  [name] agreed to follow the recommendations documented in the above note.  ARRANGE: [name] was educated on the importance of frequent visits to treat obesity as outlined per CMS and USPSTF guidelines and agreed to schedule his next follow up appointment today.  I, Burt KnackSharon Martin, am acting as transcriptionist for Quillian Quincearen Maryclaire Stoecker, MD  I have  reviewed the above documentation for accuracy and completeness, and I agree with the above. -Quillian Quincearen Ronelle Michie, MD

## 2019-07-21 ENCOUNTER — Ambulatory Visit (INDEPENDENT_AMBULATORY_CARE_PROVIDER_SITE_OTHER): Payer: BC Managed Care – PPO | Admitting: Internal Medicine

## 2019-07-21 ENCOUNTER — Encounter: Payer: Self-pay | Admitting: Internal Medicine

## 2019-07-21 ENCOUNTER — Other Ambulatory Visit: Payer: Self-pay

## 2019-07-21 VITALS — BP 128/88 | HR 95 | Ht 66.0 in | Wt 257.0 lb

## 2019-07-21 DIAGNOSIS — I442 Atrioventricular block, complete: Secondary | ICD-10-CM | POA: Diagnosis not present

## 2019-07-21 DIAGNOSIS — Z950 Presence of cardiac pacemaker: Secondary | ICD-10-CM

## 2019-07-21 NOTE — Patient Instructions (Signed)
Medication Instructions:  Your physician recommends that you continue on your current medications as directed. Please refer to the Current Medication list given to you today.  Labwork: None ordered.  Testing/Procedures: None ordered.  Follow-Up: Your physician wants you to follow-up in: one year with Dr. Lovena Le.   You will receive a reminder letter in the mail two months in advance. If you don't receive a letter, please call our office to schedule the follow-up appointment.  Remote monitoring is used to monitor your Pacemaker from home. This monitoring reduces the number of office visits required to check your device to one time per year. It allows [place] to keep an eye on the functioning of your device to ensure it is working properly. You are scheduled for a device check from home on 10/07/2019. You may send your transmission at any time that day. If you have a wireless device, the transmission will be sent automatically. After your physician reviews your transmission, you will receive a postcard with your next transmission date.  Any Other Special Instructions Will Be Listed Below (If Applicable).  If you need a refill on your cardiac medications before your next appointment, please call your pharmacy.

## 2019-07-21 NOTE — Progress Notes (Signed)
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 He has done well in the interim but c/o that he has not had much luck losing weight during Covid. He denies chest pain or sob. No edema. He admits to some dietary indiscretion. Allergies  Allergen Reactions  . Haloperidol Other (See Comments)    Fatigue  . Ibuprofen Other (See Comments)    Cannot take while on lithium  . Sulfa Antibiotics Rash  . Sulfamethoxazole Rash     Current Outpatient Medications  Medication Sig Dispense Refill  . ASHWAGANDHA PO Take 250 mg by mouth once.    [name] aspirin EC 81 MG tablet Take 81 mg by mouth daily.    [name] atorvastatin (LIPITOR) 20 MG tablet Take 20 mg by mouth Daily.     . furosemide (LASIX) 40 MG tablet Take 2 tablets (80 mg total) by mouth daily. Please keep upcoming appt in November before anymore refills. Thank you 180 tablet 0  . lamoTRIgine (LAMICTAL) 25 MG tablet Take 75 mg by mouth daily.  4  . lithium carbonate (LITHOBID) 300 MG CR tablet Take 900 mg by mouth at bedtime.  4  . metFORMIN (GLUCOPHAGE) 500 MG tablet Take 1 tablet (500 mg total) by mouth 2 (two) times daily with a meal. 60 tablet 0  . OVER THE COUNTER MEDICATION Take 3 capsules by mouth daily. CocoaVia- for memory and blood flow    . OVER THE COUNTER MEDICATION Take 2 tablets by mouth daily. For memory - Sensoril 250 mg tablets    . oxybutynin (DITROPAN-XL) 10 MG 24 hr tablet Take 10 mg by mouth daily.    . potassium chloride SA (KLOR-CON) 20 MEQ tablet Take 2 tablets (40 mEq total) by mouth daily. Please keep f/u on 07/21/19 to continue refills. Thanks. 180 tablet 0  . pramipexole (MIRAPEX) 1.5 MG tablet Take 1 mg by mouth at bedtime.     . Vitamin D, Ergocalciferol, (DRISDOL) 1.25 MG (50000 UT) CAPS capsule Take 1 capsule (50,000 Units total) by mouth every 7 (seven) days. 4 capsule 0   No current facility-administered medications for this visit.      Past Medical History:  Diagnosis Date  .  Anxiety   . Bipolar disorder (HCC)   . BPH (benign prostatic hypertrophy)   . Chest pain   . Complete heart block (HCC)    a. s/p Medtronic PPM 2017  . Cystitis, acute   . Depression   . Diverticulitis   . Dyslipidemia   . Dyspnea   . Edema   . Lumbago   . Nephrolithiasis   . Pulmonary embolism (HCC) 10/2009    ROS:   All systems reviewed and negative except as noted in the HPI.   Past Surgical History:  Procedure Laterality Date  . EP IMPLANTABLE DEVICE N/A 12/04/2015   Procedure: Pacemaker Implant;  Surgeon: Marinus Maw, MD;  Location: [facility] INVASIVE CV LAB;  Service: Cardiovascular;  Laterality: N/A;  . FINGER FRACTURE SURGERY  2004  . KIDNEY STONES     REMOVAL   . KNEE ARTHROSCOPY     RIGHT KNEE  . TONSILLECTOMY       Family History  Problem Relation Age of Onset  . Heart attack Father 18  . Hyperlipidemia Father   . Diabetes Father   . Hypertension Father   . Heart disease Father   . Fibromyalgia Sister   . Stroke Mother   . Arrhythmia  Mother        atrial fib.  . Depression Mother   . Anxiety disorder Mother   . Bipolar disorder Mother      Social History   Socioeconomic History  . Marital status: Married    Spouse name: Sean Winters  . Number of children: 2  . Years of education: Not on file  . Highest education level: Not on file  Occupational History  . Occupation: Transport planner  Social Needs  . Financial resource strain: Not on file  . Food insecurity    Worry: Not on file    Inability: Not on file  . Transportation needs    Medical: Not on file    Non-medical: Not on file  Tobacco Use  . Smoking status: Former Smoker    Packs/day: 1.00    Years: 18.00    Pack years: 18.00    Types: Cigarettes    Quit date: 09/03/1987    Years since quitting: 31.9  . Smokeless tobacco: Never Used  Substance and Sexual Activity  . Alcohol use: No  . Drug use: No  . Sexual activity: Not on file  Lifestyle  . Physical activity     Days per week: Not on file    Minutes per session: Not on file  . Stress: Not on file  Relationships  . Social Herbalist on phone: Not on file    Gets together: Not on file    Attends religious service: Not on file    Active member of club or organization: Not on file    Attends meetings of clubs or organizations: Not on file    Relationship status: Not on file  . Intimate partner violence    Fear of current or ex partner: Not on file    Emotionally abused: Not on file    Physically abused: Not on file    Forced sexual activity: Not on file  Other Topics Concern  . Not on file  Social History Narrative  . Not on file     BP 128/88   Pulse 95   Ht 5\' 6"  (1.676 m)   Wt 257 lb (116.6 kg)   SpO2 96%   BMI 41.48 kg/m   Physical Exam:  Well appearing overweight 63 yo man, NAD HEENT: Unremarkable Neck:  No JVD, no thyromegally Lymphatics:  No adenopathy Back:  No CVA tenderness Lungs:  Clear with no wheezes HEART:  Regular rate rhythm, no murmurs, no rubs, no clicks Abd:  soft, positive bowel sounds, no organomegally, no rebound, no guarding Ext:  2 plus pulses, no edema, no cyanosis, no clubbing Skin:  No rashes no nodules Neuro:  CN II through XII intact, motor grossly intact  DEVICE  Normal device function.  See PaceArt for details.   Assess/Plan: 1. CHB - he is asymptomatic, s/p PPM insertion. 2. 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 3. Obesity - I have encouraged the patient to lose weight. 4. HTN - his SBP is controlled. He is encouraged to avoid salty foods and increase his physical activity.  Sean Winters.D.

## 2019-07-25 ENCOUNTER — Other Ambulatory Visit (INDEPENDENT_AMBULATORY_CARE_PROVIDER_SITE_OTHER): Payer: Self-pay | Admitting: Family Medicine

## 2019-07-25 DIAGNOSIS — R7303 Prediabetes: Secondary | ICD-10-CM

## 2019-08-16 ENCOUNTER — Encounter (INDEPENDENT_AMBULATORY_CARE_PROVIDER_SITE_OTHER): Payer: Self-pay | Admitting: Family Medicine

## 2019-08-16 ENCOUNTER — Other Ambulatory Visit: Payer: Self-pay

## 2019-08-16 ENCOUNTER — Ambulatory Visit (INDEPENDENT_AMBULATORY_CARE_PROVIDER_SITE_OTHER): Payer: BC Managed Care – PPO | Admitting: Family Medicine

## 2019-08-16 VITALS — BP 120/71 | HR 80 | Temp 98.1°F | Ht 66.0 in | Wt 254.0 lb

## 2019-08-16 DIAGNOSIS — Z6841 Body Mass Index (BMI) 40.0 and over, adult: Secondary | ICD-10-CM

## 2019-08-16 DIAGNOSIS — R7303 Prediabetes: Secondary | ICD-10-CM

## 2019-08-16 MED ORDER — METFORMIN HCL 500 MG PO TABS: 500.0000 mg | ORAL_TABLET | Freq: Two times a day (BID) | ORAL | 0 refills | Status: DC

## 2019-08-16 NOTE — Progress Notes (Signed)
Office: [phone]  /  Fax: [phone]   HPI:  Chief Complaint: OBESITY Sean Winters is here to discuss his progress with his obesity treatment plan. He is on the Category 3 plan and states he is following his eating plan approximately 65% of the time. He states he is walking the dog 30 minutes 7 times per week.  Sean Winters is mostly retaining water weight today. He has been deviating from his plan more, but not eating excessively. He feels he will do pretty well over Christmas due to decreased parties and temptations.  Today's visit was #22 Starting weight: 250 lbs Starting date: 04/16/2018 Today's weight: 254 lbs  Today's date: 08/16/2019 Total lbs lost to date: 0 Total lbs lost since last in-office visit: 0  Prediabetes Sean Winters has a diagnosis of prediabetes and is working on diet and exercise, but has had some increase in simple carbs recently. Last A1c was 5.9 on 06/24/2019.  ASSESSMENT AND PLAN:  Prediabetes - Plan: metFORMIN (GLUCOPHAGE) 500 MG tablet  Class 3 severe obesity with serious comorbidity and body mass index (BMI) of 40.0 to 44.9 in adult, unspecified obesity type (HCC)  PLAN:  Pre-Diabetes Sean Winters will continue to work on weight loss, exercise, and decreasing simple carbohydrates to help decrease the risk of diabetes. He was given a refill on his metformin 500 mg BID #60 with 0 refills and agrees to follow-up with our clinic in 3-4 weeks.  Obesity Sean Winters is currently in the action stage of change. As such, his goal is to continue with weight loss efforts. He has agreed to follow the Category 3 plan. Sean Winters has been instructed to work up to a goal of 150 minutes of combined cardio and strengthening exercise per week for weight loss and overall health benefits. We discussed the following Behavioral Modification Strategies today: decreasing simple carbohydrates and holiday eating strategies.  Sean Winters has agreed to follow-up with our clinic in 3-4 weeks. He was  informed of the importance of frequent follow-up visits to maximize his success with intensive lifestyle modifications for his multiple health conditions.  ALLERGIES: Allergies  Allergen Reactions  . Haloperidol Other (See Comments)    Fatigue  . Ibuprofen Other (See Comments)    Cannot take while on lithium  . Sulfa Antibiotics Rash  . Sulfamethoxazole Rash    MEDICATIONS: Current Outpatient Medications on File Prior to Visit  Medication Sig Dispense Refill  . amoxicillin (AMOXIL) 500 MG capsule Take 500 mg by mouth 4 (four) times daily.    . ASHWAGANDHA PO Take 250 mg by mouth once.    [name] aspirin EC 81 MG tablet Take 81 mg by mouth daily.    [name] atorvastatin (LIPITOR) 20 MG tablet Take 20 mg by mouth Daily.     . furosemide (LASIX) 40 MG tablet Take 2 tablets (80 mg total) by mouth daily. Please keep upcoming appt in November before anymore refills. Thank you 180 tablet 0  . lamoTRIgine (LAMICTAL) 25 MG tablet Take 75 mg by mouth daily.  4  . lithium carbonate (LITHOBID) 300 MG CR tablet Take 900 mg by mouth at bedtime.  4  . OVER THE COUNTER MEDICATION Take 3 capsules by mouth daily. CocoaVia- for memory and blood flow    . OVER THE COUNTER MEDICATION Take 2 tablets by mouth daily. For memory - Sensoril 250 mg tablets    . oxybutynin (DITROPAN-XL) 10 MG 24 hr tablet Take 10 mg by mouth daily.    . potassium chloride SA (KLOR-CON) 20 MEQ  tablet Take 2 tablets (40 mEq total) by mouth daily. Please keep f/u on 07/21/19 to continue refills. Thanks. 180 tablet 0  . pramipexole (MIRAPEX) 1.5 MG tablet Take 1 mg by mouth at bedtime.     . Vitamin D, Ergocalciferol, (DRISDOL) 1.25 MG (50000 UT) CAPS capsule Take 1 capsule (50,000 Units total) by mouth every 7 (seven) days. 4 capsule 0   No current facility-administered medications on file prior to visit.    PAST MEDICAL HISTORY: Past Medical History:  Diagnosis Date  . Anxiety   . Bipolar disorder (HCC)   . BPH (benign prostatic  hypertrophy)   . Chest pain   . Complete heart block (HCC)    a. s/p Medtronic PPM 2017  . Cystitis, acute   . Depression   . Diverticulitis   . Dyslipidemia   . Dyspnea   . Edema   . Lumbago   . Nephrolithiasis   . Pulmonary embolism (HCC) 10/2009    PAST SURGICAL HISTORY: Past Surgical History:  Procedure Laterality Date  . EP IMPLANTABLE DEVICE N/A 12/04/2015   Procedure: Pacemaker Implant;  Surgeon: Sean MawGregg W Taylor, MD;  [facility];  Service: Cardiovascular;  Laterality: N/A;  . FINGER FRACTURE SURGERY  2004  . KIDNEY STONES     REMOVAL   . KNEE ARTHROSCOPY     RIGHT KNEE  . TONSILLECTOMY      SOCIAL HISTORY: Social History   Tobacco Use  . Smoking status: Former Smoker    Packs/day: 1.00    Years: 18.00    Pack years: 18.00    Types: Cigarettes    Quit date: 09/03/1987    Years since quitting: 31.9  . Smokeless tobacco: Never Used  Substance Use Topics  . Alcohol use: No  . Drug use: No    FAMILY HISTORY: Family History  Problem Relation Age of Onset  . Heart attack Father 8650  . Hyperlipidemia Father   . Diabetes Father   . Hypertension Father   . Heart disease Father   . Fibromyalgia Sister   . Stroke Mother   . Arrhythmia Mother        atrial fib.  . Depression Mother   . Anxiety disorder Mother   . Bipolar disorder Mother    ROS: ROS none noted.  PHYSICAL EXAM: Blood pressure 120/71, pulse 80, temperature 98.1 F (36.7 C), temperature source Oral, height 5\' 6"  (1.676 m), weight 254 lb (115.2 kg), SpO2 92 %. Body mass index is 41 kg/m. Physical Exam Vitals reviewed.  Constitutional:      Appearance: Normal appearance. He is obese.  Cardiovascular:     Rate and Rhythm: Normal rate.     Pulses: Normal pulses.  Pulmonary:     Effort: Pulmonary effort is normal.     Breath sounds: Normal breath sounds.  Musculoskeletal:        General: Normal range of motion.  Skin:    General: Skin is warm and dry.  Neurological:      Mental Status: He is alert and oriented to person, place, and time.  Psychiatric:        Behavior: Behavior normal.   RECENT LABS AND TESTS: BMET    Component Value Date/Time   NA 142 06/24/2019 0800   K 4.3 06/24/2019 0800   CL 108 (H) 06/24/2019 0800   CO2 23 06/24/2019 0800   GLUCOSE 85 06/24/2019 0800   GLUCOSE 99 12/02/2018 0809   BUN 22 06/24/2019 0800  CREATININE 1.03 06/24/2019 0800   CALCIUM 9.0 06/24/2019 0800   GFRNONAA 76 06/24/2019 0800   GFRAA 88 06/24/2019 0800   Lab Results  Component Value Date   HGBA1C 5.9 (H) 06/24/2019   HGBA1C 5.9 (H) 09/15/2018   HGBA1C 5.9 (H) 04/16/2018   HGBA1C 5.8 (H) 12/02/2015   HGBA1C 5.4 03/14/2012   Lab Results  Component Value Date   INSULIN 19.7 06/24/2019   INSULIN 11.3 09/15/2018   INSULIN 15.2 04/16/2018   CBC    Component Value Date/Time   WBC 11.4 (H) 06/24/2019 0800   WBC 11.8 (H) 12/02/2018 0809   RBC 4.86 06/24/2019 0800   RBC 4.76 12/02/2018 0809   HGB 13.9 06/24/2019 0800   HCT 41.7 06/24/2019 0800   PLT 306 06/24/2019 0800   MCV 86 06/24/2019 0800   MCH 28.6 06/24/2019 0800   MCH 27.5 12/02/2018 0809   MCHC 33.3 06/24/2019 0800   MCHC 30.5 12/02/2018 0809   RDW 13.4 06/24/2019 0800   LYMPHSABS 1.9 06/24/2019 0800   MONOABS 0.8 12/02/2018 0809   EOSABS 0.2 06/24/2019 0800   BASOSABS 0.1 06/24/2019 0800   Iron/TIBC/Ferritin/ %Sat No results found for: IRON, TIBC, FERRITIN, IRONPCTSAT Lipid Panel     Component Value Date/Time   CHOL 162 06/24/2019 0800   TRIG 135 06/24/2019 0800   HDL 37 (L) 06/24/2019 0800   CHOLHDL 3.8 12/02/2015 0212   VLDL 13 12/02/2015 0212   LDLCALC 101 (H) 06/24/2019 0800   Hepatic Function Panel     Component Value Date/Time   PROT 6.8 06/24/2019 0800   ALBUMIN 4.3 06/24/2019 0800   AST 12 06/24/2019 0800   ALT 12 06/24/2019 0800   ALKPHOS 137 (H) 06/24/2019 0800   BILITOT 0.2 06/24/2019 0800     OBESITY BEHAVIORAL INTERVENTION VISIT DOCUMENTATION FOR  INSURANCE (~15 minutes)  ASK: We discussed the diagnosis of obesity with Sean Winters today and Sean Winters agreed to give [place] permission to discuss obesity behavioral modification therapy today.  ASSESS: Sean Winters has the diagnosis of obesity and his BMI today is 41.0. Sean Winters is in the action stage of change.   ADVISE: Sean Winters was educated on the multiple health risks of obesity as well as the benefit of weight loss to improve his health. He was advised of the need for long term treatment and the importance of lifestyle modifications to improve his current health and to decrease his risk of future health problems.  AGREE: Multiple dietary modification options and treatment options were discussed and  Sean Winters agreed to follow the recommendations documented in the above note.  ARRANGE: Sean Winters was educated on the importance of frequent visits to treat obesity as outlined per CMS and USPSTF guidelines and agreed to schedule his next follow up appointment today.  I, Sean Winters, am acting as Location manager for Sean Nip, MD I have reviewed the above documentation for accuracy and completeness, and I agree with the above. -Sean Nip, MD

## 2019-09-07 ENCOUNTER — Emergency Department (HOSPITAL_BASED_OUTPATIENT_CLINIC_OR_DEPARTMENT_OTHER): Payer: BC Managed Care – PPO

## 2019-09-07 ENCOUNTER — Encounter (HOSPITAL_BASED_OUTPATIENT_CLINIC_OR_DEPARTMENT_OTHER): Payer: Self-pay | Admitting: *Deleted

## 2019-09-07 ENCOUNTER — Emergency Department (HOSPITAL_BASED_OUTPATIENT_CLINIC_OR_DEPARTMENT_OTHER)
Admission: EM | Admit: 2019-09-07 | Discharge: 2019-09-07 | Disposition: A | Discharge status: Home / Self Care | Payer: BC Managed Care – PPO | Attending: Emergency Medicine | Admitting: Emergency Medicine

## 2019-09-07 ENCOUNTER — Other Ambulatory Visit: Payer: Self-pay

## 2019-09-07 DIAGNOSIS — Z882 Allergy status to sulfonamides status: Secondary | ICD-10-CM | POA: Diagnosis not present

## 2019-09-07 DIAGNOSIS — Z87891 Personal history of nicotine dependence: Secondary | ICD-10-CM | POA: Insufficient documentation

## 2019-09-07 DIAGNOSIS — I442 Atrioventricular block, complete: Secondary | ICD-10-CM | POA: Insufficient documentation

## 2019-09-07 DIAGNOSIS — Z886 Allergy status to analgesic agent status: Secondary | ICD-10-CM | POA: Diagnosis not present

## 2019-09-07 DIAGNOSIS — Z86711 Personal history of pulmonary embolism: Secondary | ICD-10-CM | POA: Diagnosis not present

## 2019-09-07 DIAGNOSIS — E782 Mixed hyperlipidemia: Secondary | ICD-10-CM | POA: Diagnosis not present

## 2019-09-07 DIAGNOSIS — R0602 Shortness of breath: Secondary | ICD-10-CM

## 2019-09-07 DIAGNOSIS — R0789 Other chest pain: Secondary | ICD-10-CM | POA: Diagnosis not present

## 2019-09-07 DIAGNOSIS — Z7982 Long term (current) use of aspirin: Secondary | ICD-10-CM | POA: Diagnosis not present

## 2019-09-07 DIAGNOSIS — L03116 Cellulitis of left lower limb: Secondary | ICD-10-CM | POA: Insufficient documentation

## 2019-09-07 DIAGNOSIS — Z7984 Long term (current) use of oral hypoglycemic drugs: Secondary | ICD-10-CM | POA: Insufficient documentation

## 2019-09-07 DIAGNOSIS — Z20822 Contact with and (suspected) exposure to covid-19: Secondary | ICD-10-CM | POA: Insufficient documentation

## 2019-09-07 DIAGNOSIS — Z79899 Other long term (current) drug therapy: Secondary | ICD-10-CM | POA: Insufficient documentation

## 2019-09-07 DIAGNOSIS — Z888 Allergy status to other drugs, medicaments and biological substances status: Secondary | ICD-10-CM | POA: Diagnosis not present

## 2019-09-07 LAB — D-DIMER, QUANTITATIVE: D-Dimer, Quant: 0.75 ug/mL-FEU — ABNORMAL HIGH (ref 0.00–0.50)

## 2019-09-07 LAB — COMPREHENSIVE METABOLIC PANEL
ALT: 18 U/L (ref 0–44)
AST: 24 U/L (ref 15–41)
Albumin: 3.9 g/dL (ref 3.5–5.0)
Alkaline Phosphatase: 113 U/L (ref 38–126)
Anion gap: 10 (ref 5–15)
BUN: 15 mg/dL (ref 8–23)
CO2: 22 mmol/L (ref 22–32)
Calcium: 8.9 mg/dL (ref 8.9–10.3)
Chloride: 109 mmol/L (ref 98–111)
Creatinine, Ser: 0.93 mg/dL (ref 0.61–1.24)
GFR calc Af Amer: >60 mL/min (ref >60)
GFR calc non Af Amer: >60 mL/min (ref >60)
Glucose, Bld: 95 mg/dL (ref 70–99)
Potassium: 4.3 mmol/L (ref 3.5–5.1)
Sodium: 141 mmol/L (ref 135–145)
Total Bilirubin: 0.8 mg/dL (ref 0.3–1.2)
Total Protein: 7 g/dL (ref 6.5–8.1)

## 2019-09-07 LAB — CBC WITH DIFFERENTIAL/PLATELET
Abs Immature Granulocytes: 0.02 10*3/uL (ref 0.00–0.07)
Basophils Absolute: 0.1 10*3/uL (ref 0.0–0.1)
Basophils Relative: 1 %
Eosinophils Absolute: 0.3 10*3/uL (ref 0.0–0.5)
Eosinophils Relative: 3 %
HCT: 45.9 % (ref 39.0–52.0)
Hemoglobin: 13.7 g/dL (ref 13.0–17.0)
Immature Granulocytes: 0 %
Lymphocytes Relative: 14 %
Lymphs Abs: 1.4 10*3/uL (ref 0.7–4.0)
MCH: 29.1 pg (ref 26.0–34.0)
MCHC: 29.8 g/dL — ABNORMAL LOW (ref 30.0–36.0)
MCV: 97.5 fL (ref 80.0–100.0)
Monocytes Absolute: 0.8 10*3/uL (ref 0.1–1.0)
Monocytes Relative: 8 %
Neutro Abs: 7.7 10*3/uL (ref 1.7–7.7)
Neutrophils Relative %: 74 %
Platelets: 226 10*3/uL (ref 150–400)
RBC: 4.71 MIL/uL (ref 4.22–5.81)
RDW: 14.2 % (ref 11.5–15.5)
WBC: 10.1 10*3/uL (ref 4.0–10.5)
nRBC: 0 % (ref 0.0–0.2)

## 2019-09-07 LAB — TROPONIN I (HIGH SENSITIVITY): Troponin I (High Sensitivity): 2 ng/L (ref <18)

## 2019-09-07 LAB — SARS CORONAVIRUS 2 AG (30 MIN TAT): SARS Coronavirus 2 Ag: NEGATIVE

## 2019-09-07 MED ORDER — IOHEXOL 350 MG/ML SOLN
100.0000 mL | Freq: Once | INTRAVENOUS | Status: AC | PRN
  Administered 2019-09-07: 100 mL via INTRAVENOUS

## 2019-09-07 MED ORDER — CEPHALEXIN 500 MG PO CAPS: 500.0000 mg | ORAL_CAPSULE | Freq: Two times a day (BID) | ORAL | 0 refills | Status: AC

## 2019-09-07 NOTE — ED Triage Notes (Signed)
Pt c/o cough SOb, bodyaches x 3 days

## 2019-09-07 NOTE — ED Provider Notes (Signed)
[facility] EMERGENCY DEPARTMENT Provider Note   CSN: [id number] Arrival date & time: 09/07/19  1312     History Chief Complaint  Patient presents with  . Shortness of Breath    Sean Winters is a 64 y.o. male.  HPI      3 days Cough, sneezing, shortness of breath, chest tightness with coughing Body aches Fatigue Bouts of sleepiness, drowsiness  Sore throat started today No fever Chills in waiting room No nausea/vomiting or diarrhea   Hx of PE, was on blood thinners but no longer   Left leg swelling, worsening over the last week, was in [place] last week drove, went to game   Past Medical History:  Diagnosis Date  . Anxiety   . Bipolar disorder (HCC)   . BPH (benign prostatic hypertrophy)   . Chest pain   . Complete heart block (HCC)    a. s/p Medtronic PPM 2017  . Cystitis, acute   . Depression   . Diverticulitis   . Dyslipidemia   . Dyspnea   . Edema   . Lumbago   . Nephrolithiasis   . Pulmonary embolism (HCC) 10/2009    Patient Active Problem List   Diagnosis Date Noted  . Hypersomnolence 07/12/2019  . Other fatigue 04/16/2018  . Shortness of breath on exertion 04/16/2018  . Other hyperlipidemia 04/16/2018  . Edema 11/10/2017  . Heart block 12/02/2015  . Complete heart block (HCC) 12/01/2015  . Healthcare maintenance 11/02/2014  . Pulmonary embolism (HCC)   . Dyslipidemia   . Cystitis, acute   . Diverticulitis   . Lumbago   . Nephrolithiasis   . BPH (benign prostatic hypertrophy)   . Chest pain   . Dyspnea   . CHEST PAIN-UNSPECIFIED 09/10/2010  . SNORING 03/12/2010  . RLQ PAIN 11/02/2009  . Hyperlipidemia 11/01/2009  . History of pulmonary embolism 11/01/2009  . BENIGN PROSTATIC HYPERTROPHY, HX OF 11/01/2009    Past Surgical History:  Procedure Laterality Date  . EP IMPLANTABLE DEVICE N/A 12/04/2015   Procedure: Pacemaker Implant;  Surgeon: Marinus Maw, MD;  Location: [facility] INVASIVE CV LAB;  Service: Cardiovascular;   Laterality: N/A;  . FINGER FRACTURE SURGERY  2004  . KIDNEY STONES     REMOVAL   . KNEE ARTHROSCOPY     RIGHT KNEE  . TONSILLECTOMY         Family History  Problem Relation Age of Onset  . Heart attack Father 31  . Hyperlipidemia Father   . Diabetes Father   . Hypertension Father   . Heart disease Father   . Fibromyalgia Sister   . Stroke Mother   . Arrhythmia Mother        atrial fib.  . Depression Mother   . Anxiety disorder Mother   . Bipolar disorder Mother     Social History   Tobacco Use  . Smoking status: Former Smoker    Packs/day: 1.00    Years: 18.00    Pack years: 18.00    Types: Cigarettes    Quit date: 09/03/1987    Years since quitting: 32.0  . Smokeless tobacco: Never Used  Substance Use Topics  . Alcohol use: No  . Drug use: No    Home Medications Prior to Admission medications   Medication Sig Start Date End Date Taking? Authorizing Provider  amoxicillin (AMOXIL) 500 MG capsule Take 500 mg by mouth 4 (four) times daily.    [provider]  ASHWAGANDHA PO Take 250 mg  by mouth once.    [provider]  aspirin EC 81 MG tablet Take 81 mg by mouth daily.    [provider]  atorvastatin (LIPITOR) 20 MG tablet Take 20 mg by mouth Daily.  01/18/12   [provider]  cephALEXin (KEFLEX) 500 MG capsule Take 1 capsule (500 mg total) by mouth 2 (two) times daily for 7 days. 09/07/19 09/14/19  Gareth Morgan, MD  furosemide (LASIX) 40 MG tablet Take 2 tablets (80 mg total) by mouth daily. Please keep upcoming appt in November before anymore refills. Thank you 06/25/19   Evans Lance, MD  lamoTRIgine (LAMICTAL) 25 MG tablet Take 75 mg by mouth daily. 11/03/15   [provider]  lithium carbonate (LITHOBID) 300 MG CR tablet Take 900 mg by mouth at bedtime. 11/15/15   [provider]  metFORMIN (GLUCOPHAGE) 500 MG tablet Take 1 tablet (500 mg total) by mouth 2 (two) times daily with a meal. 08/16/19   Dennard Nip D, MD  OVER THE COUNTER MEDICATION Take 3 capsules by mouth daily. CocoaVia- for memory and blood flow    [provider]  OVER THE COUNTER MEDICATION Take 2 tablets by mouth daily. For memory - Sensoril 250 mg tablets    [provider]  oxybutynin (DITROPAN-XL) 10 MG 24 hr tablet Take 10 mg by mouth daily. 11/07/16   [provider]  potassium chloride SA (KLOR-CON) 20 MEQ tablet Take 2 tablets (40 mEq total) by mouth daily. Please keep f/u on 07/21/19 to continue refills. Thanks. 06/22/19   Evans Lance, MD  pramipexole (MIRAPEX) 1.5 MG tablet Take 1 mg by mouth at bedtime.     [provider]  Vitamin D, Ergocalciferol, (DRISDOL) 1.25 MG (50000 UT) CAPS capsule Take 1 capsule (50,000 Units total) by mouth every 7 (seven) days. 05/31/19   Dennard Nip D, MD    Allergies    Haloperidol, Ibuprofen, Sulfa antibiotics, and Sulfamethoxazole  Review of Systems   Review of Systems  Constitutional: Positive for chills and fatigue. Negative for fever.  HENT: Positive for congestion and sore throat.   Eyes: Negative for visual disturbance.  Respiratory: Positive for cough and shortness of breath.   Cardiovascular: Negative for chest pain.  Gastrointestinal: Negative for abdominal pain, nausea and vomiting.  Genitourinary: Negative for difficulty urinating.  Musculoskeletal: Positive for myalgias. Negative for back pain and neck stiffness.  Skin: Negative for rash.  Neurological: Negative for syncope and headaches.    Physical Exam Updated Vital Signs BP (!) 105/59   Pulse 67   Temp 97.7 F (36.5 C)   Resp (!) 33   Ht 5\' 8"  (1.727 m)   Wt 113.4 kg   SpO2 97%   BMI 38.01 kg/m   Physical Exam Vitals and nursing note reviewed.  Constitutional:      General: He is not in acute distress.    Appearance: He is well-developed. He is not diaphoretic.  HENT:     Head: Normocephalic and atraumatic.  Eyes:     Conjunctiva/sclera: Conjunctivae  normal.  Cardiovascular:     Rate and Rhythm: Normal rate and regular rhythm.     Heart sounds: Normal heart sounds. No murmur. No friction rub. No gallop.   Pulmonary:     Effort: Pulmonary effort is normal. No respiratory distress.     Breath sounds: Normal breath sounds. No wheezing or rales.  Abdominal:     General: There is no distension.  Palpations: Abdomen is soft.     Tenderness: There is no abdominal tenderness. There is no guarding.  Musculoskeletal:     Cervical back: Normal range of motion.     Right lower leg: Edema present.     Left lower leg: Edema (left greater than right, erythema worse on left) present.  Skin:    General: Skin is warm and dry.  Neurological:     Mental Status: He is alert and oriented to person, place, and time.     ED Results / Procedures / Treatments   Labs (all labs ordered are listed, but only abnormal results are displayed) Labs Reviewed  CBC WITH DIFFERENTIAL/PLATELET - Abnormal; Notable for the following components:      Result Value   MCHC 29.8 (*)    All other components within normal limits  D-DIMER, QUANTITATIVE (NOT AT [facility]) - Abnormal; Notable for the following components:   D-Dimer, Quant 0.75 (*)    All other components within normal limits  SARS CORONAVIRUS 2 AG (30 MIN TAT)  SARS CORONAVIRUS 2 (TAT 6-24 HRS)  COMPREHENSIVE METABOLIC PANEL  TROPONIN I (HIGH SENSITIVITY)    EKG EKG Interpretation  Date/Time:  Tuesday September 07 2019 14:23:34 EST Ventricular Rate:  84 PR Interval:  252 QRS Duration: 90 QT Interval:  358 QTC Calculation: 423 R Axis:   60 Text Interpretation: Sinus rhythm with 1st degree A-V block Artifact Cannot rule out Anterior infarct , age undetermined Abnormal ECG No significant change since last tracing Confirmed by Alvira Monday ([id number]) on 09/07/2019 8:08:32 PM   Radiology DG Chest 2 View  Result Date: 09/07/2019 CLINICAL DATA:  Shortness of breath, cough EXAM: CHEST - 2 VIEW COMPARISON:   12/02/2018 FINDINGS: Left-sided implanted cardiac device. The heart size and mediastinal contours are stable. No focal airspace consolidation, pleural effusion, or pneumothorax. The visualized skeletal structures are unremarkable. IMPRESSION: No active cardiopulmonary disease. Electronically Signed   By: Duanne Guess D.O.   On: 09/07/2019 15:09   CT Angio Chest PE W and/or Wo Contrast  Result Date: 09/07/2019 CLINICAL DATA:  Cough, shortness of breath, and body aches for the past 3 days. EXAM: CT ANGIOGRAPHY CHEST WITH CONTRAST TECHNIQUE: Multidetector CT imaging of the chest was performed using the standard protocol during bolus administration of intravenous contrast. Multiplanar CT image reconstructions and MIPs were obtained to evaluate the vascular anatomy. CONTRAST:  OMNIPAQUE IOHEXOL 350 MG/ML SOLN COMPARISON:  CTA chest dated July 15, 2019. FINDINGS: Cardiovascular: Evaluation of the pulmonary arteries is limited due to contrast bolus timing and respiratory motion artifact. No definite evidence of pulmonary embolism. Unchanged mild cardiomegaly. No pericardial effusion. No thoracic aortic aneurysm or dissection. Unchanged left chest wall pacemaker. Mediastinum/Nodes: No enlarged mediastinal, hilar, or axillary lymph nodes. Unchanged 8 mm left thyroid nodule. No followup recommended. The trachea and esophagus demonstrate no significant findings. Lungs/Pleura: Minimal dependent subsegmental atelectasis in both lower lobes. No focal consolidation, pleural effusion, or pneumothorax. No suspicious pulmonary nodule. Upper Abdomen: No acute abnormality. Unchanged small hiatal hernia. Unchanged cholelithiasis. Musculoskeletal: No chest wall abnormality. No acute or significant osseous findings. Review of the MIP images confirms the above findings. IMPRESSION: 1. No definite pulmonary embolism.  No acute intrathoracic process. Electronically Signed   By: Obie Dredge M.D.   On: 09/07/2019 20:03    US Venous Img Lower  Left (DVT Study)  Result Date: 09/07/2019 CLINICAL DATA:  64 year old male with left lower extremity redness and swelling. EXAM: Left LOWER EXTREMITY VENOUS DOPPLER  ULTRASOUND TECHNIQUE: Gray-scale sonography with graded compression, as well as color Doppler and duplex ultrasound were performed to evaluate the lower extremity deep venous systems from the level of the common femoral vein and including the common femoral, femoral, profunda femoral, popliteal and calf veins including the posterior tibial, peroneal and gastrocnemius veins when visible. The superficial great saphenous vein was also interrogated. Spectral Doppler was utilized to evaluate flow at rest and with distal augmentation maneuvers in the common femoral, femoral and popliteal veins. COMPARISON:  None. FINDINGS: Contralateral Common Femoral Vein: Respiratory phasicity is normal and symmetric with the symptomatic side. No evidence of thrombus. Normal compressibility. Common Femoral Vein: No evidence of thrombus. Normal compressibility, respiratory phasicity and response to augmentation. Saphenofemoral Junction: No evidence of thrombus. Normal compressibility and flow on color Doppler imaging. Profunda Femoral Vein: No evidence of thrombus. Normal compressibility and flow on color Doppler imaging. Femoral Vein: No evidence of thrombus. Normal compressibility, respiratory phasicity and response to augmentation. Popliteal Vein: No evidence of thrombus. Normal compressibility, respiratory phasicity and response to augmentation. Calf Veins: No evidence of thrombus. Normal compressibility and flow on color Doppler imaging. Superficial Great Saphenous Vein: No evidence of thrombus. Normal compressibility. Venous Reflux:  None. Other Findings: There is a 5.3 x 1.8 x 2.2 cm left posterior fossa Baker's cyst. There is diffuse subcutaneous soft tissue edema at the ankle. IMPRESSION: No evidence of deep venous thrombosis. Electronically  Signed   By: Elgie Collard M.D.   On: 09/07/2019 21:34    Procedures Procedures (including critical care time)  Medications Ordered in ED Medications  iohexol (OMNIPAQUE) 350 MG/ML injection 100 mL (100 mLs Intravenous Contrast Given 09/07/19 1946)    ED Course  I have reviewed the triage vital signs and the nursing notes.  Pertinent labs & imaging results that were available during my care of the patient were reviewed by me and considered in my medical decision making (see chart for details).    MDM Rules/Calculators/A&P                      64yo male with history above presents with concern for shortness of breath, leg swelling and pain and cough.     CT completed, no sign of PE or pneumonia. Troponin negative, no sign of ACS.  LLE DVT study negative for DVT> Erythema may be venous stasis however is worse on left and given pain associated will treat for cellulitis.  Has other symptoms concerning for COVID 19 with sore throat, cough, body aches.  Testing sent and recommend quarantine.  Patient discharged in stable condition with understanding of reasons to return.   Final Clinical Impression(s) / ED Diagnoses Final diagnoses:  Suspected COVID-19 virus infection  Shortness of breath  Cellulitis of left lower extremity    Rx / DC Orders ED Discharge Orders         Ordered    cephALEXin (KEFLEX) 500 MG capsule  2 times daily     09/07/19 2143           Alvira Monday, MD 09/08/19 1548

## 2019-09-07 NOTE — ED Notes (Signed)
Pt having ultrasound in room at this time

## 2019-09-07 NOTE — ED Notes (Signed)
Lab called-unable to run trp. Need to be redrawn

## 2019-09-07 NOTE — ED Notes (Signed)
Patient transported to CT 

## 2019-09-08 ENCOUNTER — Other Ambulatory Visit: Payer: Self-pay | Admitting: Internal Medicine

## 2019-09-08 LAB — SARS CORONAVIRUS 2 (TAT 6-24 HRS): SARS Coronavirus 2: NEGATIVE

## 2019-09-09 MED ORDER — POTASSIUM CHLORIDE CRYS ER 20 MEQ PO TBCR: 40.0000 meq | EXTENDED_RELEASE_TABLET | Freq: Every day | ORAL | 3 refills | Status: DC

## 2019-09-13 ENCOUNTER — Other Ambulatory Visit: Payer: Self-pay | Admitting: Internal Medicine

## 2019-09-14 ENCOUNTER — Telehealth: Payer: Self-pay | Admitting: Pulmonary Disease

## 2019-09-14 ENCOUNTER — Ambulatory Visit (INDEPENDENT_AMBULATORY_CARE_PROVIDER_SITE_OTHER): Payer: BC Managed Care – PPO | Admitting: Bariatrics

## 2019-09-14 ENCOUNTER — Encounter (INDEPENDENT_AMBULATORY_CARE_PROVIDER_SITE_OTHER): Payer: Self-pay | Admitting: Bariatrics

## 2019-09-14 ENCOUNTER — Other Ambulatory Visit: Payer: Self-pay

## 2019-09-14 VITALS — BP 115/75 | HR 96 | Temp 97.9°F | Ht 66.0 in | Wt 251.0 lb

## 2019-09-14 DIAGNOSIS — R7303 Prediabetes: Secondary | ICD-10-CM

## 2019-09-14 DIAGNOSIS — Z6841 Body Mass Index (BMI) 40.0 and over, adult: Secondary | ICD-10-CM

## 2019-09-14 DIAGNOSIS — E785 Hyperlipidemia, unspecified: Secondary | ICD-10-CM | POA: Diagnosis not present

## 2019-09-14 NOTE — Telephone Encounter (Signed)
Spoke to [place] & she is still working on [employer].

## 2019-09-14 NOTE — Telephone Encounter (Signed)
HST ordered on 11/9.  Almyra Free does not have.  I have sent message to Bjorn Loser to see if she still has for precert.

## 2019-09-14 NOTE — Telephone Encounter (Signed)
Called the patient to make him aware he has not been contacted regarding HST as his insurance required pre-certification. Patient advised he will be contacted once approval has been received in order to set up appointment. Patient voiced understanding. Nothing further needed at this time.

## 2019-09-15 NOTE — Progress Notes (Signed)
Chief Complaint:   OBESITY Sean Winters is here to discuss his progress with his obesity treatment plan along with follow-up of his obesity related diagnoses. Manning is on the Category 3 Plan and states he is following his eating plan approximately 50% of the time. Migel states he is walking 25 minutes 7 times per week.  Today's visit was #: 23 Starting weight: 250 lbs Starting date: 04/16/2018 Today's weight: 251 lbs Today's date: 09/14/2019 Total lbs lost to date: 0 Total lbs lost since last in-office visit: 3  Interim History: Sean Winters is down 3 lbs. He is a patient of Dr. Migdalia Dk. He has a goal of being 190 lbs 16 months from now. He is doing okay with his water and protein intake.  Subjective:   Prediabetes. [name] has a diagnosis of prediabetes based on his elevated HgA1c and was informed this puts him at greater risk of developing diabetes. He continues to work on diet and exercise to decrease his risk of diabetes. He denies nausea or hypoglycemia. He is taking metformin. Last A1c 5.9 on 06/24/2019 with an insulin of 19.7.  Dyslipidemia. Rusty is taking Lipitor.   Assessment/Plan:   Prediabetes. Sean Winters will continue to work on weight loss, exercise, and decreasing simple carbohydrates to help decrease the risk of diabetes. He will continue taking metformin.  Dyslipidemia. Erasmo will continue taking Lipitor He will decrease saturated fats, and increase PUFA's and MUFA's.  Class 3 severe obesity with serious comorbidity and body mass index (BMI) of 40.0 to 44.9 in adult, unspecified obesity type ([place]).  [name] is currently in the action stage of change. As such, his goal is to continue with weight loss efforts. He has agreed to following a lower carbohydrate, vegetable and lean protein rich diet plan to "jump start" for 2-3 weeks.   He will work on meal planning and intentional eating.  Handout was given on Eating Out.  We discussed the following exercise  goals today: Sean Winters will continue to walk 25 minutes 7 times per week.  We discussed the following behavioral modification strategies today: increasing lean protein intake, decreasing simple carbohydrates, increasing vegetables, increasing water intake, decreasing eating out, no skipping meals, meal planning and cooking strategies, keeping healthy foods in the home and avoiding temptations.  Roger Shelter has agreed to follow-up with our clinic in 2-3 weeks. He was informed of the importance of frequent follow-up visits to maximize his success with intensive lifestyle modifications for his multiple health conditions.   Objective:   Blood pressure 115/75, pulse 96, temperature 97.9 F (36.6 C), height 5\' 6"  (1.676 m), weight 251 lb (113.9 kg), SpO2 95 %. Body mass index is 40.51 kg/m.  General: Cooperative, alert, well developed, in no acute distress. HEENT: Conjunctivae and lids unremarkable. Neck: No thyromegaly.  Cardiovascular: Regular rhythm.  Lungs: Normal work of breathing. Extremities: No edema.  Neurologic: No focal deficits.   Lab Results  Component Value Date   CREATININE 0.93 09/07/2019   BUN 15 09/07/2019   NA 141 09/07/2019   K 4.3 09/07/2019   CL 109 09/07/2019   CO2 22 09/07/2019   Lab Results  Component Value Date   ALT 18 09/07/2019   AST 24 09/07/2019   ALKPHOS 113 09/07/2019   BILITOT 0.8 09/07/2019   Lab Results  Component Value Date   HGBA1C 5.9 (H) 06/24/2019   HGBA1C 5.9 (H) 09/15/2018   HGBA1C 5.9 (H) 04/16/2018   HGBA1C 5.8 (H) 12/02/2015   HGBA1C 5.4  03/14/2012   Lab Results  Component Value Date   INSULIN 19.7 06/24/2019   INSULIN 11.3 09/15/2018   INSULIN 15.2 04/16/2018   Lab Results  Component Value Date   TSH 1.170 04/16/2018   Lab Results  Component Value Date   CHOL 162 06/24/2019   HDL 37 (L) 06/24/2019   LDLCALC 101 (H) 06/24/2019   TRIG 135 06/24/2019   CHOLHDL 3.8 12/02/2015   Lab Results  Component Value Date    WBC 10.1 09/07/2019   HGB 13.7 09/07/2019   HCT 45.9 09/07/2019   MCV 97.5 09/07/2019   PLT 226 09/07/2019   No results found for: IRON, TIBC, FERRITIN  Attestation Statements:   Reviewed by clinician on day of visit: allergies, medications, problem list, medical history, surgical history, family history, social history, and previous encounter notes.  Time spent on visit including pre-visit chart review and post-visit care was 22 minutes.   Fernanda Drum, am acting as Energy manager for [employer], DO   I have reviewed the above documentation for accuracy and completeness, and I agree with the above. Corinna Capra, DO

## 2019-09-16 ENCOUNTER — Other Ambulatory Visit: Payer: Self-pay

## 2019-09-16 ENCOUNTER — Ambulatory Visit: Payer: BC Managed Care – PPO

## 2019-09-16 ENCOUNTER — Encounter (INDEPENDENT_AMBULATORY_CARE_PROVIDER_SITE_OTHER): Payer: Self-pay | Admitting: Bariatrics

## 2019-09-16 DIAGNOSIS — G4733 Obstructive sleep apnea (adult) (pediatric): Secondary | ICD-10-CM | POA: Diagnosis not present

## 2019-09-16 DIAGNOSIS — G4710 Hypersomnia, unspecified: Secondary | ICD-10-CM

## 2019-09-17 ENCOUNTER — Telehealth: Payer: Self-pay | Admitting: Pulmonary Disease

## 2019-09-17 DIAGNOSIS — G4733 Obstructive sleep apnea (adult) (pediatric): Secondary | ICD-10-CM | POA: Diagnosis not present

## 2019-09-17 NOTE — Telephone Encounter (Signed)
Date of Study:09/16/2019  Per Dr. Vassie Loll Severe OSA 63 per hour Rx recommend AutoCPAP 5-20cm OV with me in 6 weeks

## 2019-09-20 ENCOUNTER — Telehealth: Payer: Self-pay

## 2019-09-20 DIAGNOSIS — G4733 Obstructive sleep apnea (adult) (pediatric): Secondary | ICD-10-CM

## 2019-09-20 NOTE — Telephone Encounter (Signed)
Called and spoke with patient. Advised of HST results/recs as stated by RA.  Patient voiced understanding. Order placed and recall put in for Dr. Vassie Loll.

## 2019-09-20 NOTE — Telephone Encounter (Signed)
Patient has been called. Nothing further needed 

## 2019-09-28 ENCOUNTER — Other Ambulatory Visit: Payer: Self-pay

## 2019-09-28 ENCOUNTER — Ambulatory Visit (INDEPENDENT_AMBULATORY_CARE_PROVIDER_SITE_OTHER): Payer: BC Managed Care – PPO | Admitting: Family Medicine

## 2019-09-28 ENCOUNTER — Telehealth: Payer: Self-pay | Admitting: Pulmonary Disease

## 2019-09-28 ENCOUNTER — Encounter (INDEPENDENT_AMBULATORY_CARE_PROVIDER_SITE_OTHER): Payer: Self-pay | Admitting: Family Medicine

## 2019-09-28 VITALS — BP 143/78 | HR 83 | Temp 97.6°F | Ht 66.0 in | Wt 250.0 lb

## 2019-09-28 DIAGNOSIS — E559 Vitamin D deficiency, unspecified: Secondary | ICD-10-CM

## 2019-09-28 DIAGNOSIS — Z6841 Body Mass Index (BMI) 40.0 and over, adult: Secondary | ICD-10-CM

## 2019-09-28 DIAGNOSIS — Z9189 Other specified personal risk factors, not elsewhere classified: Secondary | ICD-10-CM

## 2019-09-28 DIAGNOSIS — R7303 Prediabetes: Secondary | ICD-10-CM | POA: Diagnosis not present

## 2019-09-28 MED ORDER — METFORMIN HCL 500 MG PO TABS: 500.0000 mg | ORAL_TABLET | Freq: Two times a day (BID) | ORAL | 0 refills | Status: DC

## 2019-09-28 NOTE — Telephone Encounter (Signed)
I called [facility] again but there was no answer. Unable to leave message.

## 2019-09-28 NOTE — Telephone Encounter (Signed)
According to chart, the order is in process, I called [facility] to check status but there was no answer. I called pt to let him know, it was in process but will reach out to [facility] again.

## 2019-09-29 NOTE — Telephone Encounter (Signed)
Forwarding to [facility] per protocol to check status of CPAP order, thanks

## 2019-09-29 NOTE — Progress Notes (Signed)
Chief Complaint:   OBESITY Sean Winters is here to discuss his progress with his obesity treatment plan along with follow-up of his obesity related diagnoses. Sean Winters is following a lower carbohydrate, vegetable and lean protein rich diet plan and states he is following his eating plan approximately 80% of the time. Sean Winters states he is walking 20 minutes 5 times per week.  Today's visit was #: 24 Starting weight: 250 lbs Starting date: 04/16/2018 Today's weight: 250 lbs Today's date: 09/28/2019 Total lbs lost to date: 0 Total lbs lost since last in-office visit: 1  Interim History: Sean Winters has been on the low carb plan for a few weeks and he likes it. He does admit to having fruit once daily.  Subjective:   Prediabetes  Sean Winters has a diagnosis of prediabetes based on his elevated Hgb A1c and was informed this puts him at greater risk of developing diabetes. His last A1c was 5.9 (06/24/19). He continues to work on diet and exercise to decrease his risk of diabetes. He denies polyphagia.  Lab Results  Component Value Date   HGBA1C 5.9 (H) 06/24/2019   Lab Results  Component Value Date   INSULIN 19.7 06/24/2019   INSULIN 11.3 09/15/2018   INSULIN 15.2 04/16/2018    At risk for diabetes mellitus Sean Winters is at higher than average risk for developing diabetes due to his obesity and prediabetes.   Vitamin D deficiency Sean Winters's last Vitamin D level was 32.6 on 06/24/19, and is not at goal. He is currently taking prescription vit D. He denies nausea, vomiting or muscle weakness.  Assessment/Plan:   Prediabetes Sean Winters will continue to work on weight loss, exercise, and decreasing simple carbohydrates to help decrease the risk of diabetes. Sean Winters agrees to continue metformin 500 mg two times daily with meals #60 with no refills.  At risk for diabetes mellitus Sean Winters was given approximately 15 minutes of diabetes education and counseling today. We discussed insulin  resistance in detail. We discussed intensive lifestyle modifications today with an emphasis on weight loss as well as increasing exercise and decreasing simple carbohydrates in his diet.   Repetitive spaced learning was employed today to elicit superior memory formation and behavioral change.  Vitamin D deficiency Low Vitamin D level contributes to fatigue and are associated with obesity, breast, and colon cancer. Sean Winters will continue to take prescription Vitamin D @50 ,000 IU every week and he will follow-up for routine testing of Vitamin D, at least 2-3 times per year to avoid over-replacement.  Class 3 severe obesity with serious comorbidity and body mass index (BMI) of 40.0 to 44.9 in adult, unspecified obesity type ([facility]) Sean Winters is currently in the action stage of change. As such, his goal is to continue with weight loss efforts. He has agreed to following a lower carbohydrate, vegetable and lean protein rich diet plan.   Exercise goals: Sean Winters will continue walking for 20 minutes, 5 times per week.  Behavioral modification strategies: decreasing simple carbohydrates and planning for success.  Sean Winters has agreed to follow-up with our clinic in 3 weeks. He was informed of the importance of frequent follow-up visits to maximize his success with intensive lifestyle modifications for his multiple health conditions.   Objective:   Blood pressure (!) 143/78, pulse 83, temperature 97.6 F (36.4 C), temperature source Oral, height 5\' 6"  (1.676 m), weight 250 lb (113.4 kg), SpO2 95 %. Body mass index is 40.35 kg/m.  General: Cooperative, alert, well developed, in no acute distress. HEENT: Conjunctivae  and lids unremarkable. Cardiovascular: Regular rhythm.  Lungs: Normal work of breathing. Neurologic: No focal deficits.   Lab Results  Component Value Date   CREATININE 0.93 09/07/2019   BUN 15 09/07/2019   NA 141 09/07/2019   K 4.3 09/07/2019   CL 109 09/07/2019   CO2 22 09/07/2019    Lab Results  Component Value Date   ALT 18 09/07/2019   AST 24 09/07/2019   ALKPHOS 113 09/07/2019   BILITOT 0.8 09/07/2019   Lab Results  Component Value Date   HGBA1C 5.9 (H) 06/24/2019   HGBA1C 5.9 (H) 09/15/2018   HGBA1C 5.9 (H) 04/16/2018   HGBA1C 5.8 (H) 12/02/2015   HGBA1C 5.4 03/14/2012   Lab Results  Component Value Date   INSULIN 19.7 06/24/2019   INSULIN 11.3 09/15/2018   INSULIN 15.2 04/16/2018   Lab Results  Component Value Date   TSH 1.170 04/16/2018   Lab Results  Component Value Date   CHOL 162 06/24/2019   HDL 37 (L) 06/24/2019   LDLCALC 101 (H) 06/24/2019   TRIG 135 06/24/2019   CHOLHDL 3.8 12/02/2015   Lab Results  Component Value Date   WBC 10.1 09/07/2019   HGB 13.7 09/07/2019   HCT 45.9 09/07/2019   MCV 97.5 09/07/2019   PLT 226 09/07/2019   No results found for: IRON, TIBC, FERRITIN   Ref. Range 06/24/2019 08:00  Vitamin D, 25-Hydroxy Latest Ref Range: 30.0 - 100.0 ng/mL 32.6    Attestation Statements:   Reviewed by clinician on day of visit: allergies, medications, problem list, medical history, surgical history, family history, social history, and previous encounter notes.  Corey Skains, am acting as Location manager for [employer], FNP-C.  I have reviewed the above documentation for accuracy and completeness, and I agree with the above. -  Saraphina Lauderbaugh [employer], FNP-C

## 2019-09-29 NOTE — Telephone Encounter (Signed)
I will call [facility].

## 2019-09-29 NOTE — Telephone Encounter (Signed)
I called [facility] and spoke to [place].  She verified order was received and they are waiting on prior auth with insurance company.  I called pt & made him aware.  Nothing further needed.

## 2019-09-30 ENCOUNTER — Encounter (INDEPENDENT_AMBULATORY_CARE_PROVIDER_SITE_OTHER): Payer: Self-pay | Admitting: Family Medicine

## 2019-09-30 DIAGNOSIS — E559 Vitamin D deficiency, unspecified: Secondary | ICD-10-CM | POA: Insufficient documentation

## 2019-09-30 DIAGNOSIS — Z6838 Body mass index (BMI) 38.0-38.9, adult: Secondary | ICD-10-CM | POA: Insufficient documentation

## 2019-09-30 DIAGNOSIS — R7303 Prediabetes: Secondary | ICD-10-CM | POA: Insufficient documentation

## 2019-10-14 ENCOUNTER — Ambulatory Visit (INDEPENDENT_AMBULATORY_CARE_PROVIDER_SITE_OTHER): Payer: BC Managed Care – PPO | Admitting: *Deleted

## 2019-10-14 DIAGNOSIS — I442 Atrioventricular block, complete: Secondary | ICD-10-CM

## 2019-10-15 LAB — CUP PACEART REMOTE DEVICE CHECK
Battery Remaining Longevity: 89 mo
Battery Voltage: 3.02 V
Brady Statistic AP VP Percent: 2.71 %
Brady Statistic AP VS Percent: 0.94 %
Brady Statistic AS VP Percent: 0.36 %
Brady Statistic AS VS Percent: 95.99 %
Brady Statistic RA Percent Paced: 3.65 %
Brady Statistic RV Percent Paced: 3.12 %
Date Time Interrogation Session: 20210211205646
Implantable Lead Implant Date: 20170403
Implantable Lead Implant Date: 20170403
Implantable Lead Location: 753859
Implantable Lead Location: 753860
Implantable Lead Model: 5076
Implantable Lead Model: 5076
Implantable Pulse Generator Implant Date: 20170403
Lead Channel Impedance Value: 361 Ohm
Lead Channel Impedance Value: 418 Ohm
Lead Channel Impedance Value: 437 Ohm
Lead Channel Impedance Value: 494 Ohm
Lead Channel Pacing Threshold Amplitude: 0.625 V
Lead Channel Pacing Threshold Amplitude: 1.25 V
Lead Channel Pacing Threshold Pulse Width: 0.4 ms
Lead Channel Pacing Threshold Pulse Width: 0.4 ms
Lead Channel Sensing Intrinsic Amplitude: 14.375 mV
Lead Channel Sensing Intrinsic Amplitude: 14.375 mV
Lead Channel Sensing Intrinsic Amplitude: 4.75 mV
Lead Channel Sensing Intrinsic Amplitude: 4.75 mV
Lead Channel Setting Pacing Amplitude: 2 V
Lead Channel Setting Pacing Amplitude: 2.5 V
Lead Channel Setting Pacing Pulse Width: 0.4 ms
Lead Channel Setting Sensing Sensitivity: 2 mV

## 2019-10-15 NOTE — Progress Notes (Signed)
PPM Remote  

## 2019-10-20 ENCOUNTER — Other Ambulatory Visit: Payer: Self-pay

## 2019-10-20 ENCOUNTER — Ambulatory Visit (INDEPENDENT_AMBULATORY_CARE_PROVIDER_SITE_OTHER): Payer: BC Managed Care – PPO | Admitting: Family Medicine

## 2019-10-20 ENCOUNTER — Encounter (INDEPENDENT_AMBULATORY_CARE_PROVIDER_SITE_OTHER): Payer: Self-pay | Admitting: Family Medicine

## 2019-10-20 VITALS — BP 112/70 | HR 78 | Temp 97.9°F | Ht 66.0 in | Wt 254.0 lb

## 2019-10-20 DIAGNOSIS — F3289 Other specified depressive episodes: Secondary | ICD-10-CM

## 2019-10-20 DIAGNOSIS — Z6841 Body Mass Index (BMI) 40.0 and over, adult: Secondary | ICD-10-CM

## 2019-10-20 DIAGNOSIS — G4733 Obstructive sleep apnea (adult) (pediatric): Secondary | ICD-10-CM

## 2019-10-20 NOTE — Progress Notes (Signed)
Chief Complaint:   OBESITY Sean Winters is here to discuss his progress with his obesity treatment plan along with follow-up of his obesity related diagnoses. Sean Winters is following a lower carbohydrate, vegetable and lean protein rich diet plan and states he is following his eating plan approximately 75-80% of the time. Cynthia states he is walking 30 minutes 7 times per week.  Today's visit was #: 25 Starting weight: 250 lbs Starting date: 04/16/2018 Today's weight: 254 lbs Today's date: 10/20/2019 Total lbs lost to date: 0 Total lbs lost since last in-office visit: 0  Interim History: Sean Winters reports getting off plan on Super Bowl Sunday and since then he reports he has been having too many carbs. He would like to switch plans.  Subjective:   Other depression with emotional eating.  Sean Winters has Bipolar 1 disorder which is stable currently. However, Sean Winters reports significant late night eating. He is in school and working full-time. He often stays up late to study. He gets about 6 hours sleep per night. He sees a Social worker regularly.  OSA (obstructive sleep apnea). Sean Winters started CPAP 2 weeks ago and feels much better. He reports restorative sleep with the CPAP and is using it as prescribed.  Assessment/Plan:   Other depression with emotional eating.  Behavior modification techniques were discussed today to help Sean Winters deal with his emotional/non-hunger eating behaviors.  I encouraged him to discuss emotional eating with his counselor.  OSA (obstructive sleep apnea). Intensive lifestyle modifications are the first line treatment for this issue. We discussed several lifestyle modifications today and he will continue to work on diet, exercise and weight loss efforts. We will continue to monitor. Orders and follow up as documented in patient record. Sean Winters will continue CPAP as directed.  Class 3 severe obesity with serious comorbidity and body mass index (BMI) of 40.0 to 44.9  in adult, unspecified obesity type (Sean Winters).  Sean Winters is currently in the action stage of change. As such, his goal is to continue with weight loss efforts. He will switch to journaling and will keep a food journal and adhering to recommended goals of 1400-1500 calories and 90 grams of protein daily.   He was given handouts on Journaling, Protein Content of Food, and On the [place].  Exercise goals: Older adults should follow the adult guidelines. When older adults cannot meet the adult guidelines, they should be as physically active as their abilities and conditions will allow.   Behavioral modification strategies: increasing lean protein intake, decreasing simple carbohydrates and keeping a strict food journal.  Sean Winters has agreed to follow-up with our clinic in 3 weeks. He was informed of the importance of frequent follow-up visits to maximize his success with intensive lifestyle modifications for his multiple health conditions.   Objective:   Blood pressure 112/70, pulse 78, temperature 97.9 F (36.6 C), temperature source Oral, height 5\' 6"  (1.676 m), weight 254 lb (115.2 kg), SpO2 95 %. Body mass index is 41 kg/m.  General: Cooperative, alert, well developed, in no acute distress. HEENT: Conjunctivae and lids unremarkable. Cardiovascular: Regular rhythm.  Lungs: Normal work of breathing. Neurologic: No focal deficits.   Lab Results  Component Value Date   CREATININE 0.93 09/07/2019   BUN 15 09/07/2019   NA 141 09/07/2019   K 4.3 09/07/2019   CL 109 09/07/2019   CO2 22 09/07/2019   Lab Results  Component Value Date   ALT 18 09/07/2019   AST 24 09/07/2019   ALKPHOS 113 09/07/2019  BILITOT 0.8 09/07/2019   Lab Results  Component Value Date   HGBA1C 5.9 (H) 06/24/2019   HGBA1C 5.9 (H) 09/15/2018   HGBA1C 5.9 (H) 04/16/2018   HGBA1C 5.8 (H) 12/02/2015   HGBA1C 5.4 03/14/2012   Lab Results  Component Value Date   INSULIN 19.7 06/24/2019   INSULIN 11.3 09/15/2018    INSULIN 15.2 04/16/2018   Lab Results  Component Value Date   TSH 1.170 04/16/2018   Lab Results  Component Value Date   CHOL 162 06/24/2019   HDL 37 (L) 06/24/2019   LDLCALC 101 (H) 06/24/2019   TRIG 135 06/24/2019   CHOLHDL 3.8 12/02/2015   Lab Results  Component Value Date   WBC 10.1 09/07/2019   HGB 13.7 09/07/2019   HCT 45.9 09/07/2019   MCV 97.5 09/07/2019   PLT 226 09/07/2019   No results found for: IRON, TIBC, FERRITIN  Attestation Statements:   Reviewed by clinician on day of visit: allergies, medications, problem list, medical history, surgical history, family history, social history, and previous encounter notes.  IMarianna Payment, am acting as Energy manager for [place], FNP   I have reviewed the above documentation for accuracy and completeness, and I agree with the above. -  Jesse Sans, FNP

## 2019-10-24 ENCOUNTER — Encounter (INDEPENDENT_AMBULATORY_CARE_PROVIDER_SITE_OTHER): Payer: Self-pay | Admitting: Family Medicine

## 2019-10-24 DIAGNOSIS — F329 Major depressive disorder, single episode, unspecified: Secondary | ICD-10-CM | POA: Insufficient documentation

## 2019-10-24 DIAGNOSIS — F32A Depression, unspecified: Secondary | ICD-10-CM | POA: Insufficient documentation

## 2019-10-24 DIAGNOSIS — G4733 Obstructive sleep apnea (adult) (pediatric): Secondary | ICD-10-CM | POA: Insufficient documentation

## 2019-11-10 ENCOUNTER — Encounter (INDEPENDENT_AMBULATORY_CARE_PROVIDER_SITE_OTHER): Payer: Self-pay | Admitting: Family Medicine

## 2019-11-10 ENCOUNTER — Ambulatory Visit (INDEPENDENT_AMBULATORY_CARE_PROVIDER_SITE_OTHER): Payer: BC Managed Care – PPO | Admitting: Family Medicine

## 2019-11-10 ENCOUNTER — Other Ambulatory Visit: Payer: Self-pay

## 2019-11-10 ENCOUNTER — Other Ambulatory Visit (INDEPENDENT_AMBULATORY_CARE_PROVIDER_SITE_OTHER): Payer: Self-pay | Admitting: Family Medicine

## 2019-11-10 VITALS — BP 122/73 | HR 77 | Temp 97.6°F | Ht 66.0 in | Wt 250.0 lb

## 2019-11-10 DIAGNOSIS — F3289 Other specified depressive episodes: Secondary | ICD-10-CM | POA: Diagnosis not present

## 2019-11-10 DIAGNOSIS — Z9989 Dependence on other enabling machines and devices: Secondary | ICD-10-CM

## 2019-11-10 DIAGNOSIS — R7303 Prediabetes: Secondary | ICD-10-CM | POA: Diagnosis not present

## 2019-11-10 DIAGNOSIS — G4733 Obstructive sleep apnea (adult) (pediatric): Secondary | ICD-10-CM

## 2019-11-10 DIAGNOSIS — Z9189 Other specified personal risk factors, not elsewhere classified: Secondary | ICD-10-CM | POA: Diagnosis not present

## 2019-11-10 DIAGNOSIS — E559 Vitamin D deficiency, unspecified: Secondary | ICD-10-CM | POA: Diagnosis not present

## 2019-11-10 DIAGNOSIS — Z6841 Body Mass Index (BMI) 40.0 and over, adult: Secondary | ICD-10-CM

## 2019-11-10 MED ORDER — METFORMIN HCL 500 MG PO TABS: 500.0000 mg | ORAL_TABLET | Freq: Two times a day (BID) | ORAL | 0 refills | Status: DC

## 2019-11-10 NOTE — Progress Notes (Signed)
Chief Complaint:   OBESITY Sean Winters is here to discuss his progress with his obesity treatment plan along with follow-up of his obesity related diagnoses. Sean Winters is on keeping a food journal and adhering to recommended goals of 1400-1500 calories and 90 grams of protein daily and states he is following his eating plan approximately 85-90% of the time. Sean Winters states he is walking for 30 minutes 5 times per week.  Today's visit was #: 26 Starting weight: 250 lbs Starting date: 04/16/2018 Today's weight: 250 lbs Today's date: 11/10/2019 Total lbs lost to date: 0 Total lbs lost since last in-office visit: 4  Interim History: Sean Winters likes the journaling. He enjoys the freedom it provides. He is meeting calorie and protein goals. His hunger is satisfied. He has increased his walking.  Subjective:   1. Pre-diabetes Sean Winters is on metformin, and he denies polyphagia, nausea, or diarrhea.  2. Vitamin D deficiency Sean Winters's Vit D level is not at goal. He is on prescription Vit D.  3. OSA (obstructive sleep apnea) Sean Winters is on CPAP and he is using it nightly. He feels much more well rested with CPAP therapy.  4. Other depression, with emotional eating  Sean Winters feels his night eating is better controlled.  5. 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 Sean Winters had his second COVID shot a few days ago.  Assessment/Plan:   1. Pre-diabetes Sean Winters will continue to work on weight loss, exercise, and decreasing simple carbohydrates to help decrease the risk of diabetes. We will refill metformin for 1 month, and we will check labs today.  - Hemoglobin A1c - Insulin, random - Lipid Panel With LDL/HDL Ratio  - metFORMIN (GLUCOPHAGE) 500 MG tablet; Take 1 tablet (500 mg total) by mouth 2 (two) times daily with a meal.  Dispense: 60 tablet; Refill: 0  2.  Vitamin D deficiency Low Vitamin D level contributes to fatigue and are associated with obesity, breast, and colon cancer. Sean Winters agreed to continue taking prescription Vitamin D 50,000 IU every week and will follow-up for routine testing of Vitamin D, at least 2-3 times per year to avoid over-replacement. We will check labs today.  - VITAMIN D 25 Hydroxy (Vit-D Deficiency, Fractures)  3. OSA (obstructive sleep apnea) Intensive lifestyle modifications are the first line treatment for this issue. We discussed several lifestyle modifications today and he will continue to work on diet, exercise and weight loss efforts. We will continue to monitor. Sean Winters will continue his CPAP as directed. Orders and follow up as documented in patient record.   4. Other depression, with emotional eating  Sean Winters will continue techniques to reduce emotional eating, and plan for healthy snacks at night. Orders and follow up as documented in patient record.   5. At increased risk of exposure to COVID-19 virus Sean Winters was given approximately 15 minutes of COVID prevention counseling today Counseling  COVID-19 is a respiratory infection that is caused by a virus. It can cause serious infections, such as pneumonia, acute respiratory distress syndrome, acute respiratory failure, or sepsis.  You are more likely to develop a serious illness if you are 33 years of age or older, have a weak immune system, live in a nursing home, have chronic disease, or have obesity.  Get vaccinated as soon as they are available to you.  For our most current information, please visit [email or web address].  Wash  your hands often with soap and water for 20 seconds. If soap and water are not available, use alcohol-based hand sanitizer.  Wear a face mask. Make sure your mask covers your nose and mouth.  Maintain at least 6 feet distance from others when in public.  Get help right away if  You have trouble breathing, chest pain,  confusion, or other concerning symptoms.  Repetitive spaced learning was employed today to elicit superior memory formation and behavioral change.  6. Class 3 severe obesity with serious comorbidity and body mass index (BMI) of 40.0 to 44.9 in adult, unspecified obesity type ([facility]) Sean Winters is currently in the action stage of change. As such, his goal is to continue with weight loss efforts. He has agreed to keeping a food journal and adhering to recommended goals of 1400-1500 calories and 90 grams of protein daily.   Exercise goals: As is.  Behavioral modification strategies: increasing water intake and planning for success.  Sean Winters has agreed to follow-up with our clinic in 3 weeks. He was informed of the importance of frequent follow-up visits to maximize his success with intensive lifestyle modifications for his multiple health conditions.   Sean Winters was informed we would discuss his lab results at his next visit unless there is a critical issue that needs to be addressed sooner. Sean Winters agreed to keep his next visit at the agreed upon time to discuss these results.  Objective:   Blood pressure 122/73, pulse 77, temperature 97.6 F (36.4 C), temperature source Oral, height 5\' 6"  (1.676 m), weight 250 lb (113.4 kg), SpO2 96 %. Body mass index is 40.35 kg/m.  General: Cooperative, alert, well developed, in no acute distress. HEENT: Conjunctivae and lids unremarkable. Cardiovascular: Regular rhythm.  Lungs: Normal work of breathing. Neurologic: No focal deficits.   Lab Results  Component Value Date   CREATININE 0.93 09/07/2019   BUN 15 09/07/2019   NA 141 09/07/2019   K 4.3 09/07/2019   CL 109 09/07/2019   CO2 22 09/07/2019   Lab Results  Component Value Date   ALT 18 09/07/2019   AST 24 09/07/2019   ALKPHOS 113 09/07/2019   BILITOT 0.8 09/07/2019   Lab Results  Component Value Date   HGBA1C 5.9 (H) 06/24/2019   HGBA1C 5.9 (H) 09/15/2018   HGBA1C 5.9 (H) 04/16/2018    HGBA1C 5.8 (H) 12/02/2015   HGBA1C 5.4 03/14/2012   Lab Results  Component Value Date   INSULIN 19.7 06/24/2019   INSULIN 11.3 09/15/2018   INSULIN 15.2 04/16/2018   Lab Results  Component Value Date   TSH 1.170 04/16/2018   Lab Results  Component Value Date   CHOL 162 06/24/2019   HDL 37 (L) 06/24/2019   LDLCALC 101 (H) 06/24/2019   TRIG 135 06/24/2019   CHOLHDL 3.8 12/02/2015   Lab Results  Component Value Date   WBC 10.1 09/07/2019   HGB 13.7 09/07/2019   HCT 45.9 09/07/2019   MCV 97.5 09/07/2019   PLT 226 09/07/2019   No results found for: IRON, TIBC, FERRITIN  Attestation Statements:   Reviewed by clinician on day of visit: allergies, medications, problem list, medical history, surgical history, family history, social history, and previous encounter notes.   11/05/2019, am acting as Trude Mcburney for Energy manager, FNP-C.  I have reviewed the above documentation for accuracy and completeness, and I agree with the above. -  [place], FNP

## 2019-11-11 LAB — HEMOGLOBIN A1C
Est. average glucose Bld gHb Est-mCnc: 123 mg/dL
Hgb A1c MFr Bld: 5.9 % — ABNORMAL HIGH (ref 4.8–5.6)

## 2019-11-11 LAB — INSULIN, RANDOM: INSULIN: 17.2 u[IU]/mL (ref 2.6–24.9)

## 2019-11-11 LAB — VITAMIN D 25 HYDROXY (VIT D DEFICIENCY, FRACTURES): Vit D, 25-Hydroxy: 27 ng/mL — ABNORMAL LOW (ref 30.0–100.0)

## 2019-11-11 LAB — LIPID PANEL WITH LDL/HDL RATIO
Cholesterol, Total: 123 mg/dL (ref 100–199)
HDL: 36 mg/dL — ABNORMAL LOW (ref >39)
LDL Chol Calc (NIH): 69 mg/dL (ref 0–99)
LDL/HDL Ratio: 1.9 ratio (ref 0.0–3.6)
Triglycerides: 91 mg/dL (ref 0–149)
VLDL Cholesterol Cal: 18 mg/dL (ref 5–40)

## 2019-12-01 ENCOUNTER — Other Ambulatory Visit: Payer: Self-pay

## 2019-12-01 ENCOUNTER — Ambulatory Visit (INDEPENDENT_AMBULATORY_CARE_PROVIDER_SITE_OTHER): Payer: BC Managed Care – PPO | Admitting: Family Medicine

## 2019-12-01 ENCOUNTER — Encounter (INDEPENDENT_AMBULATORY_CARE_PROVIDER_SITE_OTHER): Payer: Self-pay | Admitting: Family Medicine

## 2019-12-01 VITALS — BP 113/66 | HR 70 | Temp 97.9°F | Ht 66.0 in | Wt 250.0 lb

## 2019-12-01 DIAGNOSIS — Z6841 Body Mass Index (BMI) 40.0 and over, adult: Secondary | ICD-10-CM

## 2019-12-01 DIAGNOSIS — Z9189 Other specified personal risk factors, not elsewhere classified: Secondary | ICD-10-CM | POA: Diagnosis not present

## 2019-12-01 DIAGNOSIS — R7303 Prediabetes: Secondary | ICD-10-CM | POA: Diagnosis not present

## 2019-12-01 DIAGNOSIS — E559 Vitamin D deficiency, unspecified: Secondary | ICD-10-CM

## 2019-12-01 MED ORDER — VICTOZA 18 MG/3ML ~~LOC~~ SOPN: 0.6000 mg | PEN_INJECTOR | Freq: Every day | SUBCUTANEOUS | 0 refills | Status: DC

## 2019-12-01 MED ORDER — BD PEN NEEDLE NANO 2ND GEN 32G X 4 MM MISC: 0 refills | Status: DC

## 2019-12-01 NOTE — Progress Notes (Signed)
Chief Complaint:   OBESITY Sean Winters is here to discuss his progress with his obesity treatment plan along with follow-up of his obesity related diagnoses. Sean Winters is on keeping a food journal and adhering to recommended goals of 1400-1500 calories and 90 grams of protein and states he is following his eating plan approximately 90% of the time. Sean Winters states he is walking for 30 minutes 7 times per week.  Today's visit was #: 25 Starting weight: 250 lbs Starting date: 04/16/2018 Today's weight: 250 lbs Today's date: 12/01/2019 Total lbs lost to date: 0 Total lbs lost since last in-office visit: 0  Interim History: Sean Winters is journaling daily and meeting his protein and calorie goals. He notes swelling in the ankles but cannot always take Lasix due to being so busy. His water weight did not register today on the bioimpedance scale.  Subjective:   1. Pre-diabetes A1c stable at 5.9  Sean Winters is on metformin BID, and he notes polyphagia. We discussed Victoza and he is open to this.  He denies a family/personal history of thyroid cancer, or a history of pancreatitis or cholelithiasis.  He was informed of possible side effects.  I discussed labs with the patient today.  2. Vitamin D deficiency Sean Winters's Vit D level is not at goal. He is on prescription Vit D, and his level has not increased much. He admits to non-compliance. I discussed labs with the patient today.  3. At risk for osteoporosis Sean Winters is at higher risk of osteopenia and osteoporosis due to Vitamin D deficiency.   Assessment/Plan:   1. Pre-diabetes Draiden will continue to work on weight loss, exercise, and decreasing simple carbohydrates to help decrease the risk of diabetes. Sean Winters agreed to start Victoza 0.6 mg SubQ daily with no refills, and with nano needles #100 with no refills. He will start at 0.3 mg daily and then titrate up to 0.6 mg if no side effects.  - liraglutide (VICTOZA) 18 MG/3ML SOPN; Inject 0.1  mLs (0.6 mg total) into the skin daily.  Dispense: 1 pen; Refill: 0 - Insulin Pen Needle (BD PEN NEEDLE NANO 2ND GEN) 32G X 4 MM MISC; Use 1 needle daily to inject Victoza.  Dispense: 100 each; Refill: 0  2. Vitamin D deficiency Low Vitamin D level contributes to fatigue and are associated with obesity, breast, and colon cancer. Sean Winters agreed to continue taking prescription Vitamin D 50,000 IU every week, no refill needed. He was urged better compliance with Vit D. He will follow-up for routine testing of Vitamin D, at least 2-3 times per year to avoid over-replacement.  3. At risk for osteoporosis Sean Winters was given approximately 15 minutes of osteoporosis prevention counseling today. Sean Winters is at risk for osteopenia and osteoporosis due to his Vitamin D deficiency. He was encouraged to take his Vitamin D and follow his higher calcium diet and increase strengthening exercise to help strengthen his bones and decrease his risk of osteopenia and osteoporosis.  Repetitive spaced learning was employed today to elicit superior memory formation and behavioral change.  4. Class 3 severe obesity with serious comorbidity and body mass index (BMI) of 40.0 to 44.9 in adult, unspecified obesity type (HCC) Sean Winters is currently in the action stage of change. As such, his goal is to continue with weight loss efforts. He has agreed to keeping a food journal and adhering to recommended goals of 1400-1500 calories and 90 grams of protein daily.   Exercise goals: As is.  Behavioral modification strategies: planning for  success and keeping a strict food journal.  Sean Winters has agreed to follow-up with our clinic in 3 weeks. He was informed of the importance of frequent follow-up visits to maximize his success with intensive lifestyle modifications for his multiple health conditions.   Objective:   Blood pressure 113/66, pulse 70, temperature 97.9 F (36.6 C), temperature source Oral, height 5\' 6"  (1.676 m), weight  250 lb (113.4 kg), SpO2 97 %. Body mass index is 40.35 kg/m.  General: Cooperative, alert, well developed, in no acute distress. HEENT: Conjunctivae and lids unremarkable. Cardiovascular: Regular rhythm.  Lungs: Normal work of breathing. Neurologic: No focal deficits.   Lab Results  Component Value Date   CREATININE 0.93 09/07/2019   BUN 15 09/07/2019   NA 141 09/07/2019   K 4.3 09/07/2019   CL 109 09/07/2019   CO2 22 09/07/2019   Lab Results  Component Value Date   ALT 18 09/07/2019   AST 24 09/07/2019   ALKPHOS 113 09/07/2019   BILITOT 0.8 09/07/2019   Lab Results  Component Value Date   HGBA1C 5.9 (H) 11/10/2019   HGBA1C 5.9 (H) 06/24/2019   HGBA1C 5.9 (H) 09/15/2018   HGBA1C 5.9 (H) 04/16/2018   HGBA1C 5.8 (H) 12/02/2015   Lab Results  Component Value Date   INSULIN 17.2 11/10/2019   INSULIN 19.7 06/24/2019   INSULIN 11.3 09/15/2018   INSULIN 15.2 04/16/2018   Lab Results  Component Value Date   TSH 1.170 04/16/2018   Lab Results  Component Value Date   CHOL 123 11/10/2019   HDL 36 (L) 11/10/2019   LDLCALC 69 11/10/2019   TRIG 91 11/10/2019   CHOLHDL 3.8 12/02/2015   Lab Results  Component Value Date   WBC 10.1 09/07/2019   HGB 13.7 09/07/2019   HCT 45.9 09/07/2019   MCV 97.5 09/07/2019   PLT 226 09/07/2019   No results found for: IRON, TIBC, FERRITIN  Attestation Statements:   Reviewed by clinician on day of visit: allergies, medications, problem list, medical history, surgical history, family history, social history, and previous encounter notes.   Wilhemena Durie, am acting as Location manager for [employer], FNP-C.  I have reviewed the above documentation for accuracy and completeness, and I agree with the above. -  Georgianne Fick, FNP

## 2019-12-10 ENCOUNTER — Ambulatory Visit (INDEPENDENT_AMBULATORY_CARE_PROVIDER_SITE_OTHER): Payer: BC Managed Care – PPO | Admitting: Pulmonary Disease

## 2019-12-10 ENCOUNTER — Encounter: Payer: Self-pay | Admitting: Pulmonary Disease

## 2019-12-10 ENCOUNTER — Other Ambulatory Visit: Payer: Self-pay

## 2019-12-10 DIAGNOSIS — R0609 Other forms of dyspnea: Secondary | ICD-10-CM

## 2019-12-10 DIAGNOSIS — R0600 Dyspnea, unspecified: Secondary | ICD-10-CM | POA: Diagnosis not present

## 2019-12-10 DIAGNOSIS — G4733 Obstructive sleep apnea (adult) (pediatric): Secondary | ICD-10-CM

## 2019-12-10 NOTE — Progress Notes (Signed)
   Subjective:    Patient ID: Sean Winters, male    DOB: 10-27-1955, 64 y.o.   MRN: [id number]  HPI  64 year old for follow-up of OSA and dyspnea on exertion  PMH -bipolar disorder, bilateral pulmonary embolism 10/2009, maintained on anticoagulation for 4 years and then switched to aspirin daily  We reviewed sleep study results today.  He feels much improved after starting his CPAP, more energy.  He did not use it for 1 night and felt like he was in a fog.  He admits that he is not getting much sleep-partly because he is studying for his masters and also occurs of his bipolar, occasionally hypomanic.  He is only getting 4 to 6 hours every night, I reviewed CPAP data on his phone  He had an ED visit 09/2019 for shortness of breath, Covid testing was negative, D-dimer was 0.75, another CT angiogram was performed which did not show any blood clots.  Due to his leg swelling, venous duplex was obtained which showed Baker's cyst in the left popliteal but no DVTs  Significant tests/ events reviewed HST 09/2019 severe OSA, AHI 63/hour, lowest desaturation 66%  09/2019 CT angiogram chest negative for PE 07/2019 CT angiogram chest negative for PE 11/2015 CT angio chest negative for PE 12/2017 venous duplex bilateral negative for DVT  06/2010 NPSG-2 1 0 pounds -AHI 1.4/hour, desaturation to 86%, few PLM's with associated arousals 2/h  Review of Systems Patient denies significant dyspnea,cough, hemoptysis,  chest pain, palpitations, pedal edema, orthopnea, paroxysmal nocturnal dyspnea, lightheadedness, nausea, vomiting, abdominal or  leg pains      Objective:   Physical Exam  Gen. Pleasant, obese, in no distress ENT - no lesions, no post nasal drip Neck: No JVD, no thyromegaly, no carotid bruits Lungs: no use of accessory muscles, no dullness to percussion, decreased without rales or rhonchi  Cardiovascular: Rhythm regular, heart sounds  normal, no murmurs or gallops, no peripheral edema  Musculoskeletal: No deformities, no cyanosis or clubbing , no tremors       Assessment & Plan:

## 2019-12-10 NOTE — Assessment & Plan Note (Signed)
Repeated CT scans have not demonstrated any pulmonary cause for his dyspnea We will hold off on any further work-up at this time since his symptoms appear to have resolved

## 2019-12-10 NOTE — Patient Instructions (Signed)
  Change auto CPAP settings to 10 to 20 cm Try to get in at least 6 hours of sleep every night

## 2019-12-10 NOTE — Assessment & Plan Note (Signed)
CPAP download was reviewed over his phone which shows good control of events up to 5/hour Occasional nights with high residuals 10-15/hour. His usage is between 5 to 6 hours every night  We will change to auto settings 10 to 20 cm, he feels he can tolerate with increased pressure Weight loss encouraged, compliance with goal of at least 6 hrs every night is the expectation. Advised against medications with sedative side effects Cautioned against driving when sleepy - understanding that sleepiness will vary on a day to day basis

## 2019-12-20 ENCOUNTER — Other Ambulatory Visit (INDEPENDENT_AMBULATORY_CARE_PROVIDER_SITE_OTHER): Payer: Self-pay | Admitting: Family Medicine

## 2019-12-20 DIAGNOSIS — R7303 Prediabetes: Secondary | ICD-10-CM

## 2019-12-23 ENCOUNTER — Ambulatory Visit (INDEPENDENT_AMBULATORY_CARE_PROVIDER_SITE_OTHER): Payer: BC Managed Care – PPO | Admitting: Family Medicine

## 2019-12-23 ENCOUNTER — Other Ambulatory Visit: Payer: Self-pay

## 2019-12-23 ENCOUNTER — Encounter (INDEPENDENT_AMBULATORY_CARE_PROVIDER_SITE_OTHER): Payer: Self-pay | Admitting: Family Medicine

## 2019-12-23 VITALS — BP 126/72 | HR 74 | Temp 97.9°F | Ht 66.0 in | Wt 246.0 lb

## 2019-12-23 DIAGNOSIS — Z9189 Other specified personal risk factors, not elsewhere classified: Secondary | ICD-10-CM

## 2019-12-23 DIAGNOSIS — E559 Vitamin D deficiency, unspecified: Secondary | ICD-10-CM | POA: Diagnosis not present

## 2019-12-23 DIAGNOSIS — Z6839 Body mass index (BMI) 39.0-39.9, adult: Secondary | ICD-10-CM

## 2019-12-23 DIAGNOSIS — R7303 Prediabetes: Secondary | ICD-10-CM

## 2019-12-24 MED ORDER — METFORMIN HCL 500 MG PO TABS: 500.0000 mg | ORAL_TABLET | Freq: Two times a day (BID) | ORAL | 0 refills | Status: DC

## 2019-12-24 MED ORDER — VICTOZA 18 MG/3ML ~~LOC~~ SOPN: 0.6000 mg | PEN_INJECTOR | Freq: Every day | SUBCUTANEOUS | 0 refills | Status: DC

## 2019-12-24 MED ORDER — BD PEN NEEDLE NANO 2ND GEN 32G X 4 MM MISC: 0 refills | Status: DC

## 2019-12-27 ENCOUNTER — Encounter (INDEPENDENT_AMBULATORY_CARE_PROVIDER_SITE_OTHER): Payer: Self-pay | Admitting: Family Medicine

## 2019-12-27 NOTE — Progress Notes (Signed)
Chief Complaint:   Sean Winters is here to discuss his progress with his Sean treatment plan along with follow-up of his Sean related diagnoses. Sean Winters is keeping a food journal and adhering to recommended goals of 1500 calories and 95 grams of protein and states he is following his eating plan approximately 85% of the time. Sean Winters states he is walking 30 minutes 1 time per week.  Today's visit was #: 28 Starting weight: 250 lbs Starting date: 04/16/2018 Today's weight: 246 lbs Today's date: 12/23/2019 Total lbs lost to date: 4 Total lbs lost since last in-office visit: 4  Interim History: Sean Winters is journaling daily and meeting his protein goals daily. He reports keeping calories around 1500 a day. He states he is very busy, but is still journaling consistently.  Subjective:   Prediabetes. Sean Winters has a diagnosis of prediabetes based on his elevated HgA1c and was informed this puts him at greater risk of developing diabetes. He continues to work on diet and exercise to decrease his risk of diabetes. He denies nausea or hypoglycemia. Sean Winters 0.6 mg was started at his last visit, but he thinks the pharmacy did not get the prescription.  Lab Results  Component Value Date   HGBA1C 5.9 (H) 11/10/2019   Lab Results  Component Value Date   INSULIN 17.2 11/10/2019   INSULIN 19.7 06/24/2019   INSULIN 11.3 09/15/2018   INSULIN 15.2 04/16/2018   Vitamin D deficiency. Last Vitamin D level was low at 27.0 on 11/10/2019. Sean Winters is on high dose Vitamin D.  At risk for constipation. Sean Winters is at increased risk for constipation due to  changes in diet, and/or use of medications such as GLP1 agonists (Sean Winters). Sean Winters denies hard, infrequent stools currently.   Assessment/Plan:   Prediabetes. Sean Winters will continue to work on weight loss, exercise, and decreasing simple carbohydrates to help decrease the risk of diabetes. I will resend script for liraglutide (Sean Winters)  18 MG/3ML SOPN 0.6 mg SQ daily #2 pens with 0 refills, Insulin Pen Needle (BD PEN NEEDLE NANO 2ND GEN) 32G X 4 MM MISC, and metFORMIN (GLUCOPHAGE) 500 MG tablet BID #60 with 0 refills.  Vitamin D deficiency. Low Vitamin D level contributes to fatigue and are associated with Sean, breast, and colon cancer. He agrees to continue to take prescription Vitamin D and will follow-up for routine testing of Vitamin D, at least 2-3 times per year to avoid over-replacement.  At risk for constipation. Sean Winters was given approximately 15 minutes of counseling today regarding prevention of constipation. He was encouraged to increase water and fiber intake.   Class 2 severe Sean with serious comorbidity and body mass index (BMI) of 39.0 to 39.9 in adult, unspecified Sean type (HCC).  Sean Winters is currently in the action stage of change. As such, his goal is to continue with weight loss efforts. He has agreed to keeping a food journal and adhering to recommended goals of 1400-1500 calories and 90 grams of protein daily.   Exercise goals: Sean Winters will continue his current exercise regimen.  Behavioral modification strategies: better snacking choices and keeping a strict food journal.  Sean Winters has agreed to follow-up with our clinic in 3 weeks. He was informed of the importance of frequent follow-up visits to maximize his success with intensive lifestyle modifications for his multiple health conditions.   Objective:   Blood pressure 126/72, pulse 74, temperature 97.9 F (36.6 C), temperature source Oral, height 5\' 6"  (1.676 m), weight 246 lb (111.6 kg),  SpO2 96 %. Body mass index is 39.71 kg/m.  General: Cooperative, alert, well developed, in no acute distress. HEENT: Conjunctivae and lids unremarkable. Cardiovascular: Regular rhythm.  Lungs: Normal work of breathing. Neurologic: No focal deficits.   Lab Results  Component Value Date   CREATININE 0.93 09/07/2019   BUN 15 09/07/2019   NA 141  09/07/2019   K 4.3 09/07/2019   CL 109 09/07/2019   CO2 22 09/07/2019   Lab Results  Component Value Date   ALT 18 09/07/2019   AST 24 09/07/2019   ALKPHOS 113 09/07/2019   BILITOT 0.8 09/07/2019   Lab Results  Component Value Date   HGBA1C 5.9 (H) 11/10/2019   HGBA1C 5.9 (H) 06/24/2019   HGBA1C 5.9 (H) 09/15/2018   HGBA1C 5.9 (H) 04/16/2018   HGBA1C 5.8 (H) 12/02/2015   Lab Results  Component Value Date   INSULIN 17.2 11/10/2019   INSULIN 19.7 06/24/2019   INSULIN 11.3 09/15/2018   INSULIN 15.2 04/16/2018   Lab Results  Component Value Date   TSH 1.170 04/16/2018   Lab Results  Component Value Date   CHOL 123 11/10/2019   HDL 36 (L) 11/10/2019   LDLCALC 69 11/10/2019   TRIG 91 11/10/2019   CHOLHDL 3.8 12/02/2015   Lab Results  Component Value Date   WBC 10.1 09/07/2019   HGB 13.7 09/07/2019   HCT 45.9 09/07/2019   MCV 97.5 09/07/2019   PLT 226 09/07/2019   No results found for: IRON, TIBC, FERRITIN  Attestation Statements:   Reviewed by clinician on day of visit: allergies, medications, problem list, medical history, surgical history, family history, social history, and previous encounter notes.  IMichaelene Song, am acting as Location manager for [employer], FNP   I have reviewed the above documentation for accuracy and completeness, and I agree with the above. -  Georgianne Fick, FNP

## 2020-01-13 ENCOUNTER — Ambulatory Visit (INDEPENDENT_AMBULATORY_CARE_PROVIDER_SITE_OTHER): Payer: BC Managed Care – PPO | Admitting: *Deleted

## 2020-01-13 ENCOUNTER — Ambulatory Visit (INDEPENDENT_AMBULATORY_CARE_PROVIDER_SITE_OTHER): Payer: BC Managed Care – PPO | Admitting: Family Medicine

## 2020-01-13 ENCOUNTER — Encounter (INDEPENDENT_AMBULATORY_CARE_PROVIDER_SITE_OTHER): Payer: Self-pay | Admitting: Family Medicine

## 2020-01-13 ENCOUNTER — Other Ambulatory Visit: Payer: Self-pay

## 2020-01-13 VITALS — BP 136/74 | HR 67 | Temp 97.8°F | Ht 66.0 in | Wt 245.0 lb

## 2020-01-13 DIAGNOSIS — Z6839 Body mass index (BMI) 39.0-39.9, adult: Secondary | ICD-10-CM

## 2020-01-13 DIAGNOSIS — R7303 Prediabetes: Secondary | ICD-10-CM

## 2020-01-13 DIAGNOSIS — I442 Atrioventricular block, complete: Secondary | ICD-10-CM

## 2020-01-13 LAB — CUP PACEART REMOTE DEVICE CHECK
Battery Remaining Longevity: 89 mo
Battery Voltage: 3.01 V
Brady Statistic AP VP Percent: 3.79 %
Brady Statistic AP VS Percent: 1.09 %
Brady Statistic AS VP Percent: 0.93 %
Brady Statistic AS VS Percent: 94.19 %
Brady Statistic RA Percent Paced: 4.88 %
Brady Statistic RV Percent Paced: 4.75 %
Date Time Interrogation Session: 20210513070331
Implantable Lead Implant Date: 20170403
Implantable Lead Implant Date: 20170403
Implantable Lead Location: 753859
Implantable Lead Location: 753860
Implantable Lead Model: 5076
Implantable Lead Model: 5076
Implantable Pulse Generator Implant Date: 20170403
Lead Channel Impedance Value: 361 Ohm
Lead Channel Impedance Value: 399 Ohm
Lead Channel Impedance Value: 437 Ohm
Lead Channel Impedance Value: 494 Ohm
Lead Channel Pacing Threshold Amplitude: 0.625 V
Lead Channel Pacing Threshold Amplitude: 1 V
Lead Channel Pacing Threshold Pulse Width: 0.4 ms
Lead Channel Pacing Threshold Pulse Width: 0.4 ms
Lead Channel Sensing Intrinsic Amplitude: 12.75 mV
Lead Channel Sensing Intrinsic Amplitude: 12.75 mV
Lead Channel Sensing Intrinsic Amplitude: 4.125 mV
Lead Channel Sensing Intrinsic Amplitude: 4.125 mV
Lead Channel Setting Pacing Amplitude: 2 V
Lead Channel Setting Pacing Amplitude: 2.5 V
Lead Channel Setting Pacing Pulse Width: 0.4 ms
Lead Channel Setting Sensing Sensitivity: 2 mV

## 2020-01-13 NOTE — Progress Notes (Signed)
Chief Complaint:   OBESITY Sean Winters is here to discuss his progress with his obesity treatment plan along with follow-up of his obesity related diagnoses. Sean Winters is keeping a food journal and adhering to recommended goals of 1500 calories and 95 grams of protein and states he is following his eating plan approximately 85% of the time. Sean Winters states he is walking 25-30 minutes 7 times per week.  Today's visit was #: 22 Starting weight: 250 lbs Starting date: 04/16/2018 Today's weight: 245 lbs Today's date: 01/13/2020 Total lbs lost to date: 5 Total lbs lost since last in-office visit: 1  Interim History: Sean Winters is journaling daily and meeting his protein and calorie goals mostly. He is eating out quite often and tries to make good choices. He feels that being so busy with work and school makes it difficult for him to adhere well to the plan.   Subjective:   Prediabetes. Sean Winters has a diagnosis of prediabetes based on his elevated HgA1c and was informed this puts him at greater risk of developing diabetes. He continues to work on diet and exercise to decrease his risk of diabetes. He denies nausea or hypoglycemia. Insurance does not cover Victoza. He is on metformin BID and notes hunger is well controlled.  Lab Results  Component Value Date   HGBA1C 5.9 (H) 11/10/2019   Lab Results  Component Value Date   INSULIN 17.2 11/10/2019   INSULIN 19.7 06/24/2019   INSULIN 11.3 09/15/2018   INSULIN 15.2 04/16/2018   Assessment/Plan:   Prediabetes. Sean Winters will continue to work on weight loss, exercise, and decreasing simple carbohydrates to help decrease the risk of diabetes. He will discontinue Victoza and continue metformin as directed.  Class 2 severe obesity with serious comorbidity and body mass index (BMI) of 39.0 to 39.9 in adult, unspecified obesity type ([place]).  Sean Winters is currently in the action stage of change. As such, his goal is to continue with weight loss  efforts. He has agreed to keeping a food journal and adhering to recommended goals of 1250-1500 calories and 90 grams of protein daily.   Handout was given for recipe for egg muffins.  Exercise goals: Sean Winters will continue his current exercise regimen.  Behavioral modification strategies: decreasing eating out, meal planning and cooking strategies and planning for success.  Sean Winters has agreed to follow-up with our clinic in 3 weeks. He was informed of the importance of frequent follow-up visits to maximize his success with intensive lifestyle modifications for his multiple health conditions.   Objective:   Blood pressure 136/74, pulse 67, temperature 97.8 F (36.6 C), temperature source Oral, height 5\' 6"  (1.676 m), weight 245 lb (111.1 kg), SpO2 95 %. Body mass index is 39.54 kg/m.  General: Cooperative, alert, well developed, in no acute distress. HEENT: Conjunctivae and lids unremarkable. Cardiovascular: Regular rhythm.  Lungs: Normal work of breathing. Neurologic: No focal deficits.   Lab Results  Component Value Date   CREATININE 0.93 09/07/2019   BUN 15 09/07/2019   NA 141 09/07/2019   K 4.3 09/07/2019   CL 109 09/07/2019   CO2 22 09/07/2019   Lab Results  Component Value Date   ALT 18 09/07/2019   AST 24 09/07/2019   ALKPHOS 113 09/07/2019   BILITOT 0.8 09/07/2019   Lab Results  Component Value Date   HGBA1C 5.9 (H) 11/10/2019   HGBA1C 5.9 (H) 06/24/2019   HGBA1C 5.9 (H) 09/15/2018   HGBA1C 5.9 (H) 04/16/2018   HGBA1C 5.8 (  H) 12/02/2015   Lab Results  Component Value Date   INSULIN 17.2 11/10/2019   INSULIN 19.7 06/24/2019   INSULIN 11.3 09/15/2018   INSULIN 15.2 04/16/2018   Lab Results  Component Value Date   TSH 1.170 04/16/2018   Lab Results  Component Value Date   CHOL 123 11/10/2019   HDL 36 (L) 11/10/2019   LDLCALC 69 11/10/2019   TRIG 91 11/10/2019   CHOLHDL 3.8 12/02/2015   Lab Results  Component Value Date   WBC 10.1 09/07/2019    HGB 13.7 09/07/2019   HCT 45.9 09/07/2019   MCV 97.5 09/07/2019   PLT 226 09/07/2019   No results found for: IRON, TIBC, FERRITIN  Attestation Statements:   Reviewed by clinician on day of visit: allergies, medications, problem list, medical history, surgical history, family history, social history, and previous encounter notes.  IMarianna Payment, am acting as Energy manager for [employer], FNP   I have reviewed the above documentation for accuracy and completeness, and I agree with the above. -  Jesse Sans, FNP

## 2020-01-17 NOTE — Progress Notes (Signed)
Remote pacemaker transmission.   

## 2020-02-03 ENCOUNTER — Encounter (INDEPENDENT_AMBULATORY_CARE_PROVIDER_SITE_OTHER): Payer: Self-pay | Admitting: Family Medicine

## 2020-02-03 ENCOUNTER — Other Ambulatory Visit: Payer: Self-pay

## 2020-02-03 ENCOUNTER — Ambulatory Visit (INDEPENDENT_AMBULATORY_CARE_PROVIDER_SITE_OTHER): Payer: Medicare Other | Admitting: Family Medicine

## 2020-02-03 VITALS — BP 152/74 | HR 77 | Temp 97.7°F | Ht 66.0 in | Wt 247.0 lb

## 2020-02-03 DIAGNOSIS — R7303 Prediabetes: Secondary | ICD-10-CM

## 2020-02-03 DIAGNOSIS — R030 Elevated blood-pressure reading, without diagnosis of hypertension: Secondary | ICD-10-CM

## 2020-02-03 DIAGNOSIS — Z6839 Body mass index (BMI) 39.0-39.9, adult: Secondary | ICD-10-CM | POA: Diagnosis not present

## 2020-02-03 MED ORDER — METFORMIN HCL 500 MG PO TABS: 500.0000 mg | ORAL_TABLET | Freq: Two times a day (BID) | ORAL | 0 refills | Status: DC

## 2020-02-07 ENCOUNTER — Encounter (INDEPENDENT_AMBULATORY_CARE_PROVIDER_SITE_OTHER): Payer: Self-pay | Admitting: Family Medicine

## 2020-02-07 DIAGNOSIS — R030 Elevated blood-pressure reading, without diagnosis of hypertension: Secondary | ICD-10-CM | POA: Insufficient documentation

## 2020-02-07 NOTE — Progress Notes (Signed)
Chief Complaint:   OBESITY Sean Winters is here to discuss his progress with his obesity treatment plan along with follow-up of his obesity related diagnoses. Sean Winters is on keeping a food journal and adhering to recommended goals of 1250-1500 calories and 90 grams of protein daily and states he is following his eating plan approximately 85% of the time. Sean Winters states he is walking for 30 minutes 4-5 times per week.  Today's visit was #: 53 Starting weight: 250 lbs Starting date: 04/16/2018 Today's weight: 247 lbs Today's date: 02/03/2020 Total lbs lost to date: 3 Total lbs lost since last in-office visit: 0  Interim History: Sean Winters feels his protein has not been at goal recently. He does not always journal. He has reduced eating out recently and tries to make good choices.  Subjective:   1. Pre-diabetes Sean Winters has a diagnosis of pre-diabetes based on his elevated Hgb A1c and was informed this puts him at greater risk of developing diabetes. He notes polyphagia in the evenings and feels this is a combination of cravings and hunger. He continues to work on diet and exercise to decrease his risk of diabetes.   Lab Results  Component Value Date   HGBA1C 5.9 (H) 11/10/2019   Lab Results  Component Value Date   INSULIN 17.2 11/10/2019   INSULIN 19.7 06/24/2019   INSULIN 11.3 09/15/2018   INSULIN 15.2 04/16/2018   2. Elevated blood pressure reading Sean Winters blood pressure is usually well controlled. He denies hx of HTN. He feels it is elevated due to stress. He finds it hard to take Lasix consistently due to his schedule (maybe 2 times per week).   BP Readings from Last 3 Encounters:  02/03/20 (!) 152/74  01/13/20 136/74  12/23/19 126/72   Lab Results  Component Value Date   CREATININE 0.93 09/07/2019   CREATININE 1.03 06/24/2019   CREATININE 1.36 (H) 12/02/2018   Assessment/Plan:   1. Pre-diabetes Sean Winters will continue to work on weight loss, exercise, and decreasing  simple carbohydrates to help decrease the risk of diabetes. We will refill metformin for 1 month.  - metFORMIN (GLUCOPHAGE) 500 MG tablet; Take 1 tablet (500 mg total) by mouth 2 (two) times daily with a meal.  Dispense: 60 tablet; Refill: 0  2. Elevated blood pressure reading  We will continue to monitor, and will take Lasix as directed as much as possible.  3. Class 2 severe obesity with serious comorbidity and body mass index (BMI) of 39.0 to 39.9 in adult, unspecified obesity type (HCC) Sean Winters is currently in the action stage of change. As such, his goal is to continue with weight loss efforts. He has agreed to keeping a food journal and adhering to recommended goals of 1250-1500 calories and 90 grams of protein daily.   Handout given today: Protein Equivalents.  Exercise goals: As is.  Behavioral modification strategies: increasing lean protein intake, decreasing simple carbohydrates, decreasing eating out and keeping a strict food journal.  Sean Winters has agreed to follow-up with our clinic in 3 weeks. He was informed of the importance of frequent follow-up visits to maximize his success with intensive lifestyle modifications for his multiple health conditions.   Objective:   Blood pressure (!) 152/74, pulse 77, temperature 97.7 F (36.5 C), temperature source Oral, height 5\' 6"  (1.676 m), weight 247 lb (112 kg), SpO2 96 %. Body mass index is 39.87 kg/m.  General: Cooperative, alert, well developed, in no acute distress. HEENT: Conjunctivae and lids unremarkable. Cardiovascular: Regular rhythm.  Lungs: Normal work of breathing. Neurologic: No focal deficits.   Lab Results  Component Value Date   CREATININE 0.93 09/07/2019   BUN 15 09/07/2019   NA 141 09/07/2019   K 4.3 09/07/2019   CL 109 09/07/2019   CO2 22 09/07/2019   Lab Results  Component Value Date   ALT 18 09/07/2019   AST 24 09/07/2019   ALKPHOS 113 09/07/2019   BILITOT 0.8 09/07/2019   Lab Results   Component Value Date   HGBA1C 5.9 (H) 11/10/2019   HGBA1C 5.9 (H) 06/24/2019   HGBA1C 5.9 (H) 09/15/2018   HGBA1C 5.9 (H) 04/16/2018   HGBA1C 5.8 (H) 12/02/2015   Lab Results  Component Value Date   INSULIN 17.2 11/10/2019   INSULIN 19.7 06/24/2019   INSULIN 11.3 09/15/2018   INSULIN 15.2 04/16/2018   Lab Results  Component Value Date   TSH 1.170 04/16/2018   Lab Results  Component Value Date   CHOL 123 11/10/2019   HDL 36 (L) 11/10/2019   LDLCALC 69 11/10/2019   TRIG 91 11/10/2019   CHOLHDL 3.8 12/02/2015   Lab Results  Component Value Date   WBC 10.1 09/07/2019   HGB 13.7 09/07/2019   HCT 45.9 09/07/2019   MCV 97.5 09/07/2019   PLT 226 09/07/2019   No results found for: IRON, TIBC, FERRITIN  Obesity Behavioral Intervention Documentation for Insurance:   Approximately 15 minutes were spent on the discussion below.  ASK: We discussed the diagnosis of obesity with Sean Winters today and Sean Winters agreed to give [place] permission to discuss obesity behavioral modification therapy today.  ASSESS: Sean Winters has the diagnosis of obesity and his BMI today is 39.89. Sean Winters is in the action stage of change.   ADVISE: Sean Winters was educated on the multiple health risks of obesity as well as the benefit of weight loss to improve his health. He was advised of the need for long term treatment and the importance of lifestyle modifications to improve his current health and to decrease his risk of future health problems.  AGREE: Multiple dietary modification options and treatment options were discussed and Sean Winters agreed to follow the recommendations documented in the above note.  ARRANGE: Sean Winters was educated on the importance of frequent visits to treat obesity as outlined per CMS and USPSTF guidelines and agreed to schedule his next follow up appointment today.  Attestation Statements:   Reviewed by clinician on day of visit: allergies, medications, problem list, medical history,  surgical history, family history, social history, and previous encounter notes.   Trude Mcburney, am acting as Energy manager for [place], FNP-C.  I have reviewed the above documentation for accuracy and completeness, and I agree with the above. -  Jesse Sans, FNP

## 2020-02-14 ENCOUNTER — Other Ambulatory Visit (INDEPENDENT_AMBULATORY_CARE_PROVIDER_SITE_OTHER): Payer: Self-pay | Admitting: Family Medicine

## 2020-02-14 DIAGNOSIS — R7303 Prediabetes: Secondary | ICD-10-CM

## 2020-02-28 ENCOUNTER — Ambulatory Visit (INDEPENDENT_AMBULATORY_CARE_PROVIDER_SITE_OTHER): Payer: Medicare Other | Admitting: Family Medicine

## 2020-03-13 ENCOUNTER — Encounter (INDEPENDENT_AMBULATORY_CARE_PROVIDER_SITE_OTHER): Payer: Self-pay | Admitting: Family Medicine

## 2020-03-13 ENCOUNTER — Other Ambulatory Visit: Payer: Self-pay

## 2020-03-13 ENCOUNTER — Ambulatory Visit (INDEPENDENT_AMBULATORY_CARE_PROVIDER_SITE_OTHER): Payer: BC Managed Care – PPO | Admitting: Family Medicine

## 2020-03-13 VITALS — BP 130/84 | HR 98 | Temp 97.6°F | Ht 66.0 in | Wt 239.0 lb

## 2020-03-13 DIAGNOSIS — M1712 Unilateral primary osteoarthritis, left knee: Secondary | ICD-10-CM

## 2020-03-13 DIAGNOSIS — F319 Bipolar disorder, unspecified: Secondary | ICD-10-CM

## 2020-03-13 DIAGNOSIS — E559 Vitamin D deficiency, unspecified: Secondary | ICD-10-CM

## 2020-03-13 DIAGNOSIS — Z6838 Body mass index (BMI) 38.0-38.9, adult: Secondary | ICD-10-CM | POA: Diagnosis not present

## 2020-03-13 DIAGNOSIS — Z9189 Other specified personal risk factors, not elsewhere classified: Secondary | ICD-10-CM

## 2020-03-13 DIAGNOSIS — R7303 Prediabetes: Secondary | ICD-10-CM | POA: Diagnosis not present

## 2020-03-13 DIAGNOSIS — R030 Elevated blood-pressure reading, without diagnosis of hypertension: Secondary | ICD-10-CM

## 2020-03-13 NOTE — Progress Notes (Signed)
Chief Complaint:   OBESITY Sean Winters is here to discuss his progress with his obesity treatment plan along with follow-up of his obesity related diagnoses. Sean Winters is on keeping a food journal and adhering to recommended goals of 1250-1500 calories and 90 grams of protein and states he is following his eating plan approximately 75% of the time. Sean Winters states he is doing limited exercise.  Today's visit was #: 31 Starting weight: 250 lbs Starting date: 04/16/2018 Today's weight: 239 lbs Today's date: 03/14/2020 Total lbs lost to date: 11 lbs Total lbs lost since last in-office visit: 8 lbs  Interim History: Sean Winters says he has been trying to fill up on protein and is "staying away from carbs".  Last office visit was 5 weeks ago with Dawn.  He says he is eating less fast foods/less McDonald's; overall eating healthier.  At age 64, he had a "bout of bipolar" and his doctor recommended he get into a job with less "executive thinking".  He is in grad school at the [employer] to get his master's degree in psychiatry.  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  He saw Orthopedics 3 weeks ago.  He says he needs a TKR on his left knee.  Subjective:   1. Elevated blood pressure reading Blood pressure at last visit was 152/74.  Normal CV ROS.  He is on Lasix secondary to fluid retention due to Lithium.  Denies history of hypertension.  Has pacemaker.  Cardiology gives him Lasix for lower extremity edema.  He had an echo in 10/2017 that was within normal limits.  BP Readings from Last 3 Encounters:  03/13/20 130/84  02/03/20 (!) 152/74  01/13/20 136/74   2. Osteoarthritis of left knee, unspecified osteoarthritis type Sean Winters has OA of the left knee and saw Orthopedics 3 weeks ago.  He says that he needs a TKR.  It inhibits his ability to exercise.  3. Prediabetes Sean Winters has a diagnosis of prediabetes based on his elevated HgA1c and was informed this puts him at greater risk of  developing diabetes. He continues to work on diet and exercise to decrease his risk of diabetes. He denies nausea or hypoglycemia.  Lab Results  Component Value Date   HGBA1C 5.9 (H) 11/10/2019   Lab Results  Component Value Date   INSULIN 17.2 11/10/2019   INSULIN 19.7 06/24/2019   INSULIN 11.3 09/15/2018   INSULIN 15.2 04/16/2018   4. Vitamin D deficiency Sean Winters's Vitamin D level was 27.0 on 11/10/2019. He is currently prescribed prescription vitamin D 50,000 IU each week, but does not take it regularly. He denies nausea, vomiting or muscle weakness.  5. Bipolar disorder, rapid cycling Sean Winters) Sean Winters sees Dr. Wellington Hampshire of Psychiatry in [facility].  He sees a therapist at the [facility] every 2 weeks also.    6. At risk for diabetes mellitus Sean Winters is at higher than average risk for developing diabetes due to his obesity.   Assessment/Plan:   1. Elevated blood pressure reading Continue treatment per Cardiology.  Encouraged treatment adherence; at least take 1 tablet of Lasix and 1 tablet of potassium if he cannot take 2 due to urinary frequency and "being on the toilet all day long or night long" interfering with his life.  Continue prudent nutritional plan and weight loss.  2. Osteoarthritis of left knee, unspecified osteoarthritis type Will follow because mobility and pain control are important for weight management.  3. Prediabetes Sean Winters will continue to  work on weight loss, exercise, and decreasing simple carbohydrates to help decrease the risk of diabetes.  Recheck A1c in mid September.  Continue prudent nutritional plan and weight loss. - metFORMIN (GLUCOPHAGE) 500 MG tablet; Take 1 tablet (500 mg total) by mouth 2 (two) times daily with a meal.  Dispense: 60 tablet; Refill: 0  4. Vitamin D deficiency Recheck vitamin D in mid September along with A1c.  Take weekly for the next 2 months, consistently, and recheck level after that.  Continue prudent nutritional  plan and weight loss.  5. Bipolar disorder, rapid cycling [facility]) Treatment plan per Psychiatry.  Prudent nutritional plan will help mood.  6. At risk for diabetes mellitus Sean Winters was given approximately 15 minutes of diabetes education and counseling today. We discussed intensive lifestyle modifications today with an emphasis on weight loss as well as increasing exercise and decreasing simple carbohydrates in his diet. We also reviewed medication options with an emphasis on risk versus benefit of those discussed.   Repetitive spaced learning was employed today to elicit superior memory formation and behavioral change.  7. Class 2 severe obesity with serious comorbidity and body mass index (BMI) of 38.0 to 38.9 in adult, unspecified obesity type (HCC) Sean Winters is currently in the action stage of change. As such, his goal is to continue with weight loss efforts. He has agreed to keeping a food journal and adhering to recommended goals of 1250-1500 calories and 90 grams of protein.   Exercise goals: As is.  Behavioral modification strategies: increasing lean protein intake, decreasing simple carbohydrates, increasing water intake, meal planning and cooking strategies and better snacking choices.  Sean Winters has agreed to follow-up with our clinic in 4 weeks. He was informed of the importance of frequent follow-up visits to maximize his success with intensive lifestyle modifications for his multiple health conditions.   Objective:   Blood pressure 130/84, pulse 98, temperature 97.6 F (36.4 C), temperature source Oral, height 5\' 6"  (1.676 m), weight 239 lb (108.4 kg), SpO2 94 %. Body mass index is 38.58 kg/m.  General: Cooperative, alert, well developed, in no acute distress. HEENT: Conjunctivae and lids unremarkable. Cardiovascular: Regular rhythm.  Lungs: Normal work of breathing. Neurologic: No focal deficits.   Lab Results  Component Value Date   CREATININE 0.93 09/07/2019   BUN 15  09/07/2019   NA 141 09/07/2019   K 4.3 09/07/2019   CL 109 09/07/2019   CO2 22 09/07/2019   Lab Results  Component Value Date   ALT 18 09/07/2019   AST 24 09/07/2019   ALKPHOS 113 09/07/2019   BILITOT 0.8 09/07/2019   Lab Results  Component Value Date   HGBA1C 5.9 (H) 11/10/2019   HGBA1C 5.9 (H) 06/24/2019   HGBA1C 5.9 (H) 09/15/2018   HGBA1C 5.9 (H) 04/16/2018   HGBA1C 5.8 (H) 12/02/2015   Lab Results  Component Value Date   INSULIN 17.2 11/10/2019   INSULIN 19.7 06/24/2019   INSULIN 11.3 09/15/2018   INSULIN 15.2 04/16/2018   Lab Results  Component Value Date   TSH 1.170 04/16/2018   Lab Results  Component Value Date   CHOL 123 11/10/2019   HDL 36 (L) 11/10/2019   LDLCALC 69 11/10/2019   TRIG 91 11/10/2019   CHOLHDL 3.8 12/02/2015   Lab Results  Component Value Date   WBC 10.1 09/07/2019   HGB 13.7 09/07/2019   HCT 45.9 09/07/2019   MCV 97.5 09/07/2019   PLT 226 09/07/2019   Obesity Behavioral Intervention Documentation for  Insurance:   Approximately 15 minutes were spent on the discussion below.  ASK: We discussed the diagnosis of obesity with [name] today and Tracen agreed to give [place] permission to discuss obesity behavioral modification therapy today.  ASSESS: Hamzah has the diagnosis of obesity and his BMI today is 38.6. Camdin is in the action stage of change.   ADVISE: Dereck was educated on the multiple health risks of obesity as well as the benefit of weight loss to improve his health. He was advised of the need for long term treatment and the importance of lifestyle modifications to improve his current health and to decrease his risk of future health problems.  AGREE: Multiple dietary modification options and treatment options were discussed and Hurschel agreed to follow the recommendations documented in the above note.  ARRANGE: Deyvi was educated on the importance of frequent visits to treat obesity as outlined per CMS and USPSTF  guidelines and agreed to schedule his next follow up appointment today.  Attestation Statements:   Reviewed by clinician on day of visit: allergies, medications, problem list, medical history, surgical history, family history, social history, and previous encounter notes.  I, Insurance claims handler, CMA, am acting as Energy manager for [employer], DO.  I have reviewed the above documentation for accuracy and completeness, and I agree with the above. Thomasene Lot, DO

## 2020-03-14 MED ORDER — METFORMIN HCL 500 MG PO TABS: 500.0000 mg | ORAL_TABLET | Freq: Two times a day (BID) | ORAL | 0 refills | Status: DC

## 2020-04-10 ENCOUNTER — Ambulatory Visit (INDEPENDENT_AMBULATORY_CARE_PROVIDER_SITE_OTHER): Payer: BLUE CROSS/BLUE SHIELD | Admitting: Family Medicine

## 2020-04-10 ENCOUNTER — Other Ambulatory Visit: Payer: Self-pay

## 2020-04-10 ENCOUNTER — Encounter (INDEPENDENT_AMBULATORY_CARE_PROVIDER_SITE_OTHER): Payer: Self-pay | Admitting: Family Medicine

## 2020-04-10 VITALS — BP 131/84 | HR 91 | Temp 98.1°F | Ht 66.0 in | Wt 237.0 lb

## 2020-04-10 DIAGNOSIS — Z6838 Body mass index (BMI) 38.0-38.9, adult: Secondary | ICD-10-CM

## 2020-04-10 DIAGNOSIS — F319 Bipolar disorder, unspecified: Secondary | ICD-10-CM | POA: Insufficient documentation

## 2020-04-10 DIAGNOSIS — R7303 Prediabetes: Secondary | ICD-10-CM

## 2020-04-10 DIAGNOSIS — E7849 Other hyperlipidemia: Secondary | ICD-10-CM

## 2020-04-10 DIAGNOSIS — E559 Vitamin D deficiency, unspecified: Secondary | ICD-10-CM

## 2020-04-10 MED ORDER — ATORVASTATIN CALCIUM 20 MG PO TABS: 20.0000 mg | ORAL_TABLET | Freq: Every day | ORAL | 0 refills | Status: AC

## 2020-04-10 NOTE — Progress Notes (Addendum)
Chief Complaint:   OBESITY Kotaro is here to discuss his progress with his obesity treatment plan along with follow-up of his obesity related diagnoses. Karlo is on keeping a food journal and adhering to recommended goals of 1250-1500 calories and 90 grams of protein daily and states he is following his eating plan approximately 75% of the time. Deveon states he is walking for 25 minutes 5 times per week.  Today's visit was #: 32 Starting weight: 250 lbs Starting date: 04/16/2018 Today's weight: 237 lbs Today's date: 04/10/2020 Total lbs lost to date: 13 Total lbs lost since last in-office visit: 2  Interim History: Nael is no longer working. He quit his job and is concentrating on obtaining his Masters degree in counseling. He is journaling inconsistently. He notes he tends to track in his head. He has cut back on eating out. He is trying to have a microwave meal at supper rather than eating out.  Subjective:   1. Bipolar disorder, rapid cycling (HCC) Edvardo reports his mood is stable. He feels that quitting work has helped with this. He continues to see his counselor regularly (every 2 weeks), and psychiatry regularly (twice yearly).  2. Vitamin D deficiency Spiros's Vit D level is low at 27. He is on prescription Vit D weekly.  3. Other hyperlipidemia Tiandre has hyperlipidemia and has been trying to improve his cholesterol levels with intensive lifestyle modification including a low saturated fat diet, exercise and weight loss. Last FLP was normal except for low HDL. He denies any chest pain, claudication or myalgias.  Lab Results  Component Value Date   ALT 18 09/07/2019   AST 24 09/07/2019   ALKPHOS 113 09/07/2019   BILITOT 0.8 09/07/2019   Lab Results  Component Value Date   CHOL 123 11/10/2019   HDL 36 (L) 11/10/2019   LDLCALC 69 11/10/2019   TRIG 91 11/10/2019   CHOLHDL 3.8 12/02/2015   4. Pre-diabetes Roald has a diagnosis of pre-diabetes based on  his elevated Hgb A1c and was informed this puts him at greater risk of developing diabetes. Last A1c was 5.9, and he is not on metformin. He continues to work on diet and exercise to decrease his risk of diabetes. He denies nausea or hypoglycemia.  Lab Results  Component Value Date   HGBA1C 5.9 (H) 11/10/2019   Lab Results  Component Value Date   INSULIN 17.2 11/10/2019   INSULIN 19.7 06/24/2019   INSULIN 11.3 09/15/2018   INSULIN 15.2 04/16/2018   Assessment/Plan:   1. Bipolar disorder, rapid cycling (HCC) Devean will continue to follow up with his counselor and Psychiatrist.  2. Vitamin D deficiency Low Vitamin D level contributes to fatigue and are associated with obesity, breast, and colon cancer. Jodeci agreed to continue taking prescription Vitamin D 50,000 IU every week and will follow-up for routine testing of Vitamin D, at least 2-3 times per year to avoid over-replacement. We will check labs today.  - VITAMIN D 25 Hydroxy (Vit-D Deficiency, Fractures)  3. Other hyperlipidemia Cardiovascular risk and specific lipid/LDL goals reviewed. We discussed several lifestyle modifications today. Cloud will continue to work on diet, exercise and weight loss efforts. We will refill atorvastatin for 90 days with no refills.  - atorvastatin (LIPITOR) 20 MG tablet; Take 1 tablet (20 mg total) by mouth daily.  Dispense: 90 tablet; Refill: 0  4. Pre-diabetes Isael will continue to work on weight loss, exercise, and decreasing simple carbohydrates to help decrease the risk of  diabetes. We will check labs today.  - Comprehensive metabolic panel - Hemoglobin A1c - Insulin, random  5. Class 2 severe obesity with serious comorbidity and body mass index (BMI) of 38.0 to 38.9 in adult, unspecified obesity type (HCC) Kean is currently in the action stage of change. As such, his goal is to continue with weight loss efforts. He has agreed to keeping a food journal and adhering to  recommended goals of 1250-1500 calories and 90 grams of protein daily.   We discussed journaling daily using My Fitness Pal to keep a more accurate record of his food intake. [name]  Exercise goals: As is.  Behavioral modification strategies: decreasing eating out, planning for success and keeping a strict food journal.  Rodell has agreed to follow-up with our clinic in 3 weeks. He was informed of the importance of frequent follow-up visits to maximize his success with intensive lifestyle modifications for his multiple health conditions.   Adams was informed we would discuss his lab results at his next visit unless there is a critical issue that needs to be addressed sooner. Donnel agreed to keep his next visit at the agreed upon time to discuss these results.  Objective:   Blood pressure 131/84, pulse 91, temperature 98.1 F (36.7 C), temperature source Oral, height 5\' 6"  (1.676 m), weight 237 lb (107.5 kg), SpO2 94 %. Body mass index is 38.25 kg/m.  General: Cooperative, alert, well developed, in no acute distress. HEENT: Conjunctivae and lids unremarkable. Cardiovascular: Regular rhythm.  Lungs: Normal work of breathing. Neurologic: No focal deficits.   Lab Results  Component Value Date   CREATININE 0.93 09/07/2019   BUN 15 09/07/2019   NA 141 09/07/2019   K 4.3 09/07/2019   CL 109 09/07/2019   CO2 22 09/07/2019   Lab Results  Component Value Date   ALT 18 09/07/2019   AST 24 09/07/2019   ALKPHOS 113 09/07/2019   BILITOT 0.8 09/07/2019   Lab Results  Component Value Date   HGBA1C 5.9 (H) 11/10/2019   HGBA1C 5.9 (H) 06/24/2019   HGBA1C 5.9 (H) 09/15/2018   HGBA1C 5.9 (H) 04/16/2018   HGBA1C 5.8 (H) 12/02/2015   Lab Results  Component Value Date   INSULIN 17.2 11/10/2019   INSULIN 19.7 06/24/2019   INSULIN 11.3 09/15/2018   INSULIN 15.2 04/16/2018   Lab Results  Component Value Date   TSH 1.170 04/16/2018   Lab Results  Component Value Date   CHOL 123  11/10/2019   HDL 36 (L) 11/10/2019   LDLCALC 69 11/10/2019   TRIG 91 11/10/2019   CHOLHDL 3.8 12/02/2015   Lab Results  Component Value Date   WBC 10.1 09/07/2019   HGB 13.7 09/07/2019   HCT 45.9 09/07/2019   MCV 97.5 09/07/2019   PLT 226 09/07/2019   No results found for: IRON, TIBC, FERRITIN  Obesity Behavioral Intervention Documentation for Insurance:   Approximately 15 minutes were spent on the discussion below.  ASK: We discussed the diagnosis of obesity with 11/05/2019 today and Emerson agreed to give [name] permission to discuss obesity behavioral modification therapy today.  ASSESS: Kalani has the diagnosis of obesity and his BMI today is 38.27. Orval is in the action stage of change.   ADVISE: Jarid was educated on the multiple health risks of obesity as well as the benefit of weight loss to improve his health. He was advised of the need for long term treatment and the importance of lifestyle modifications to improve his current  health and to decrease his risk of future health problems.  AGREE: Multiple dietary modification options and treatment options were discussed and Besnik agreed to follow the recommendations documented in the above note.  ARRANGE: Abrar was educated on the importance of frequent visits to treat obesity as outlined per CMS and USPSTF guidelines and agreed to schedule his next follow up appointment today.  Attestation Statements:   Reviewed by clinician on day of visit: allergies, medications, problem list, medical history, surgical history, family history, social history, and previous encounter notes.   Trude Mcburney, am acting as Energy manager for [place], FNP-C.  I have reviewed the above documentation for accuracy and completeness, and I agree with the above. -  Jesse Sans, FNP

## 2020-04-11 LAB — COMPREHENSIVE METABOLIC PANEL
ALT: 13 IU/L (ref 0–44)
AST: 14 IU/L (ref 0–40)
Albumin/Globulin Ratio: 1.7 (ref 1.2–2.2)
Albumin: 4.6 g/dL (ref 3.8–4.8)
Alkaline Phosphatase: 123 IU/L — ABNORMAL HIGH (ref 48–121)
BUN/Creatinine Ratio: 13 (ref 10–24)
BUN: 16 mg/dL (ref 8–27)
Bilirubin Total: 0.4 mg/dL (ref 0.0–1.2)
CO2: 25 mmol/L (ref 20–29)
Calcium: 9.6 mg/dL (ref 8.6–10.2)
Chloride: 103 mmol/L (ref 96–106)
Creatinine, Ser: 1.19 mg/dL (ref 0.76–1.27)
GFR calc Af Amer: 73 mL/min/{1.73_m2} (ref >59)
GFR calc non Af Amer: 63 mL/min/{1.73_m2} (ref >59)
Globulin, Total: 2.7 g/dL (ref 1.5–4.5)
Glucose: 100 mg/dL — ABNORMAL HIGH (ref 65–99)
Potassium: 4.2 mmol/L (ref 3.5–5.2)
Sodium: 141 mmol/L (ref 134–144)
Total Protein: 7.3 g/dL (ref 6.0–8.5)

## 2020-04-11 LAB — HEMOGLOBIN A1C
Est. average glucose Bld gHb Est-mCnc: 117 mg/dL
Hgb A1c MFr Bld: 5.7 % — ABNORMAL HIGH (ref 4.8–5.6)

## 2020-04-11 LAB — INSULIN, RANDOM: INSULIN: 12.8 u[IU]/mL (ref 2.6–24.9)

## 2020-04-11 LAB — VITAMIN D 25 HYDROXY (VIT D DEFICIENCY, FRACTURES): Vit D, 25-Hydroxy: 22.4 ng/mL — ABNORMAL LOW (ref 30.0–100.0)

## 2020-04-13 ENCOUNTER — Ambulatory Visit (INDEPENDENT_AMBULATORY_CARE_PROVIDER_SITE_OTHER): Payer: Medicare Other | Admitting: *Deleted

## 2020-04-13 DIAGNOSIS — I442 Atrioventricular block, complete: Secondary | ICD-10-CM

## 2020-04-16 LAB — CUP PACEART REMOTE DEVICE CHECK
Battery Remaining Longevity: 80 mo
Battery Voltage: 3.01 V
Brady Statistic AP VP Percent: 2.83 %
Brady Statistic AP VS Percent: 0.94 %
Brady Statistic AS VP Percent: 0.1 %
Brady Statistic AS VS Percent: 96.13 %
Brady Statistic RA Percent Paced: 3.77 %
Brady Statistic RV Percent Paced: 2.95 %
Date Time Interrogation Session: 20210814081448
Implantable Lead Implant Date: 20170403
Implantable Lead Implant Date: 20170403
Implantable Lead Location: 753859
Implantable Lead Location: 753860
Implantable Lead Model: 5076
Implantable Lead Model: 5076
Implantable Pulse Generator Implant Date: 20170403
Lead Channel Impedance Value: 361 Ohm
Lead Channel Impedance Value: 418 Ohm
Lead Channel Impedance Value: 437 Ohm
Lead Channel Impedance Value: 494 Ohm
Lead Channel Pacing Threshold Amplitude: 0.625 V
Lead Channel Pacing Threshold Amplitude: 1.125 V
Lead Channel Pacing Threshold Pulse Width: 0.4 ms
Lead Channel Pacing Threshold Pulse Width: 0.4 ms
Lead Channel Sensing Intrinsic Amplitude: 14.875 mV
Lead Channel Sensing Intrinsic Amplitude: 14.875 mV
Lead Channel Sensing Intrinsic Amplitude: 4.25 mV
Lead Channel Sensing Intrinsic Amplitude: 4.25 mV
Lead Channel Setting Pacing Amplitude: 2 V
Lead Channel Setting Pacing Amplitude: 2.5 V
Lead Channel Setting Pacing Pulse Width: 0.4 ms
Lead Channel Setting Sensing Sensitivity: 2 mV

## 2020-04-17 NOTE — Progress Notes (Signed)
Remote pacemaker transmission.   

## 2020-04-18 ENCOUNTER — Other Ambulatory Visit (INDEPENDENT_AMBULATORY_CARE_PROVIDER_SITE_OTHER): Payer: Self-pay | Admitting: Family Medicine

## 2020-04-18 DIAGNOSIS — R7303 Prediabetes: Secondary | ICD-10-CM

## 2020-05-01 ENCOUNTER — Encounter (INDEPENDENT_AMBULATORY_CARE_PROVIDER_SITE_OTHER): Payer: Self-pay | Admitting: Family Medicine

## 2020-05-01 ENCOUNTER — Ambulatory Visit (INDEPENDENT_AMBULATORY_CARE_PROVIDER_SITE_OTHER): Payer: Medicare Other | Admitting: Family Medicine

## 2020-05-01 ENCOUNTER — Other Ambulatory Visit: Payer: Self-pay

## 2020-05-01 VITALS — BP 120/78 | HR 83 | Temp 97.5°F | Ht 66.0 in | Wt 238.0 lb

## 2020-05-01 DIAGNOSIS — F319 Bipolar disorder, unspecified: Secondary | ICD-10-CM

## 2020-05-01 DIAGNOSIS — R7303 Prediabetes: Secondary | ICD-10-CM

## 2020-05-01 DIAGNOSIS — Z6838 Body mass index (BMI) 38.0-38.9, adult: Secondary | ICD-10-CM | POA: Diagnosis not present

## 2020-05-01 MED ORDER — METFORMIN HCL 500 MG PO TABS: 500.0000 mg | ORAL_TABLET | Freq: Two times a day (BID) | ORAL | 0 refills | Status: DC

## 2020-05-01 NOTE — Progress Notes (Signed)
Chief Complaint:   OBESITY Sean Winters is here to discuss his progress with his obesity treatment plan along with follow-up of his obesity related diagnoses. Sean Winters is on keeping a food journal and adhering to recommended goals of 1250-1500 calories and 90 grams of protein daily and states he is following his eating plan approximately 85% of the time. Sean Winters states he is walking the dog for 30 minutes 5 times per week.  Today's visit was #: 33 Starting weight: 250 lbs Starting date: 04/16/2018 Today's weight: 238 lbs Today's date: 05/01/2020 Total lbs lost to date: 12 Total lbs lost since last in-office visit: 0  Interim History: Sean Winters has recently started the divorce process which is causing increased stress. He has not journaled using an app but he does keep track in his head. He is eating out quite a bit, especially at supper but often at lunch and breakfast also.   Subjective:   1. Pre-diabetes  Last A1c was 5.7. He is on metformin BID, and denies polyphagia. He continues to work on diet and exercise to decrease his risk of diabetes.   Lab Results  Component Value Date   HGBA1C 5.7 (H) 04/10/2020   Lab Results  Component Value Date   INSULIN 12.8 04/10/2020   INSULIN 17.2 11/10/2019   INSULIN 19.7 06/24/2019   INSULIN 11.3 09/15/2018   INSULIN 15.2 04/16/2018   2. Bipolar disorder, rapid cycling (HCC) Sean Winters feels his mood is fairly stable. He sees a therapist every 2 weeks and Psychiatrist regularly.  Assessment/Plan:   1. Pre-diabetes Sean Winters will continue to work on weight loss, exercise, and decreasing simple carbohydrates to help decrease the risk of diabetes. We will refill metformin for 1 month.  - metFORMIN (GLUCOPHAGE) 500 MG tablet; Take 1 tablet (500 mg total) by mouth 2 (two) times daily with a meal.  Dispense: 60 tablet; Refill: 0  2. Bipolar disorder, rapid cycling (HCC) Sean Winters will continue all his medications, and will continue to follow up with  Psychiatry and his therapist.  3. Class 2 severe obesity with serious comorbidity and body mass index (BMI) of 38.0 to 38.9 in adult, unspecified obesity type [facility]) Sean Winters is currently in the action stage of change. As such, his goal is to continue with weight loss efforts. He has agreed to keeping a food journal and adhering to recommended goals of 1250-1500 calories and 90 grams of protein daily.   Sean Winters will try to start packing lunch.  Exercise goals: As is.  Behavioral modification strategies: increasing lean protein intake and decreasing eating out.  Sean Winters has agreed to follow-up with our clinic in 3 weeks. He was informed of the importance of frequent follow-up visits to maximize his success with intensive lifestyle modifications for his multiple health conditions.   Objective:   Blood pressure 120/78, pulse 83, temperature (!) 97.5 F (36.4 C), height 5\' 6"  (1.676 m), weight 238 lb (108 kg), SpO2 95 %. Body mass index is 38.41 kg/m.  General: Cooperative, alert, well developed, in no acute distress. HEENT: Conjunctivae and lids unremarkable. Cardiovascular: Regular rhythm.  Lungs: Normal work of breathing. Neurologic: No focal deficits.   Lab Results  Component Value Date   CREATININE 1.19 04/10/2020   BUN 16 04/10/2020   NA 141 04/10/2020   K 4.2 04/10/2020   CL 103 04/10/2020   CO2 25 04/10/2020   Lab Results  Component Value Date   ALT 13 04/10/2020   AST 14 04/10/2020   ALKPHOS 123 (H)  04/10/2020   BILITOT 0.4 04/10/2020   Lab Results  Component Value Date   HGBA1C 5.7 (H) 04/10/2020   HGBA1C 5.9 (H) 11/10/2019   HGBA1C 5.9 (H) 06/24/2019   HGBA1C 5.9 (H) 09/15/2018   HGBA1C 5.9 (H) 04/16/2018   Lab Results  Component Value Date   INSULIN 12.8 04/10/2020   INSULIN 17.2 11/10/2019   INSULIN 19.7 06/24/2019   INSULIN 11.3 09/15/2018   INSULIN 15.2 04/16/2018   Lab Results  Component Value Date   TSH 1.170 04/16/2018   Lab Results    Component Value Date   CHOL 123 11/10/2019   HDL 36 (L) 11/10/2019   LDLCALC 69 11/10/2019   TRIG 91 11/10/2019   CHOLHDL 3.8 12/02/2015   Lab Results  Component Value Date   WBC 10.1 09/07/2019   HGB 13.7 09/07/2019   HCT 45.9 09/07/2019   MCV 97.5 09/07/2019   PLT 226 09/07/2019   No results found for: IRON, TIBC, FERRITIN  Obesity Behavioral Intervention Documentation for Insurance:   Approximately 15 minutes were spent on the discussion below.  ASK: We discussed the diagnosis of obesity with Sean Winters today and Sean Winters agreed to give [place] permission to discuss obesity behavioral modification therapy today.  ASSESS: Sean Winters has the diagnosis of obesity and his BMI today is 38.43. Sean Winters is in the action stage of change.   ADVISE: Sean Winters was educated on the multiple health risks of obesity as well as the benefit of weight loss to improve his health. He was advised of the need for long term treatment and the importance of lifestyle modifications to improve his current health and to decrease his risk of future health problems.  AGREE: Multiple dietary modification options and treatment options were discussed and Sean Winters agreed to follow the recommendations documented in the above note.  ARRANGE: Sean Winters was educated on the importance of frequent visits to treat obesity as outlined per CMS and USPSTF guidelines and agreed to schedule his next follow up appointment today.  Attestation Statements:   Reviewed by clinician on day of visit: allergies, medications, problem list, medical history, surgical history, family history, social history, and previous encounter notes.   Sean Winters, am acting as Energy manager for [place], FNP-C.  I have reviewed the above documentation for accuracy and completeness, and I agree with the above. -  Jesse Sans, FNP

## 2020-05-22 ENCOUNTER — Ambulatory Visit (INDEPENDENT_AMBULATORY_CARE_PROVIDER_SITE_OTHER): Payer: Medicare Other | Admitting: Family Medicine

## 2020-06-14 ENCOUNTER — Ambulatory Visit: Payer: Medicare Other | Admitting: Pulmonary Disease

## 2020-07-26 ENCOUNTER — Other Ambulatory Visit (INDEPENDENT_AMBULATORY_CARE_PROVIDER_SITE_OTHER): Payer: Self-pay | Admitting: Family Medicine

## 2020-07-26 DIAGNOSIS — E7849 Other hyperlipidemia: Secondary | ICD-10-CM

## 2020-08-08 ENCOUNTER — Ambulatory Visit (INDEPENDENT_AMBULATORY_CARE_PROVIDER_SITE_OTHER): Payer: Medicare Other | Admitting: Internal Medicine

## 2020-08-08 ENCOUNTER — Encounter: Payer: Self-pay | Admitting: Internal Medicine

## 2020-08-08 ENCOUNTER — Other Ambulatory Visit: Payer: Self-pay

## 2020-08-08 VITALS — BP 162/84 | HR 96 | Ht 66.0 in | Wt 239.0 lb

## 2020-08-08 DIAGNOSIS — I442 Atrioventricular block, complete: Secondary | ICD-10-CM

## 2020-08-08 DIAGNOSIS — Z950 Presence of cardiac pacemaker: Secondary | ICD-10-CM

## 2020-08-08 DIAGNOSIS — I10 Essential (primary) hypertension: Secondary | ICD-10-CM | POA: Diagnosis not present

## 2020-08-08 MED ORDER — CARVEDILOL 6.25 MG PO TABS: 6.2500 mg | ORAL_TABLET | Freq: Two times a day (BID) | ORAL | 3 refills | Status: DC

## 2020-08-08 NOTE — Progress Notes (Signed)
HPI [name] returns today for followup. He has a h/o CHB, s/p PPM insertion, obesity and HTN. He has moved to [place]. He has had problems with his knee and is considering knee replacement surgery. He also has chronic edema. He denies chest pain or sob. Allergies  Allergen Reactions  . Haloperidol Other (See Comments)    Fatigue  . Ibuprofen Other (See Comments)    Cannot take while on lithium  . Sulfa Antibiotics Rash  . Sulfamethoxazole Rash     Current Outpatient Medications  Medication Sig Dispense Refill  . ASHWAGANDHA PO Take 250 mg by mouth once.    [name] aspirin EC 81 MG tablet Take 81 mg by mouth daily.    [name] atorvastatin (LIPITOR) 20 MG tablet Take 1 tablet (20 mg total) by mouth daily. 90 tablet 0  . furosemide (LASIX) 40 MG tablet Take 2 tablets (80 mg total) by mouth daily. 180 tablet 3  . lamoTRIgine (LAMICTAL) 25 MG tablet Take 75 mg by mouth daily.  4  . lithium carbonate (LITHOBID) 300 MG CR tablet Take 900 mg by mouth at bedtime.  4  . metFORMIN (GLUCOPHAGE) 500 MG tablet Take 1 tablet (500 mg total) by mouth 2 (two) times daily with a meal. 60 tablet 0  . OVER THE COUNTER MEDICATION Take 3 capsules by mouth daily. CocoaVia- for memory and blood flow    . OVER THE COUNTER MEDICATION Take 2 tablets by mouth daily. For memory - Sensoril 250 mg tablets    . oxybutynin (DITROPAN-XL) 10 MG 24 hr tablet Take 10 mg by mouth daily.    . potassium chloride SA (KLOR-CON) 20 MEQ tablet Take 2 tablets (40 mEq total) by mouth daily. 180 tablet 3  . pramipexole (MIRAPEX) 1.5 MG tablet Take 1 mg by mouth at bedtime.     . Vitamin D, Ergocalciferol, (DRISDOL) 1.25 MG (50000 UT) CAPS capsule Take 1 capsule (50,000 Units total) by mouth every 7 (seven) days. 4 capsule 0   No current facility-administered medications for this visit.     Past Medical History:  Diagnosis Date  . Anxiety   . Bipolar disorder (HCC)   . BPH (benign prostatic hypertrophy)   . Chest pain   .  Complete heart block (HCC)    a. s/p Medtronic PPM 2017  . Cystitis, acute   . Depression   . Diverticulitis   . Dyslipidemia   . Dyspnea   . Edema   . Lumbago   . Nephrolithiasis   . Pulmonary embolism (HCC) 10/2009    ROS:   All systems reviewed and negative except as noted in the HPI.   Past Surgical History:  Procedure Laterality Date  . EP IMPLANTABLE DEVICE N/A 12/04/2015   Procedure: Pacemaker Implant;  Surgeon: Marinus Maw, MD;  Location: [facility] INVASIVE CV LAB;  Service: Cardiovascular;  Laterality: N/A;  . FINGER FRACTURE SURGERY  2004  . KIDNEY STONES     REMOVAL   . KNEE ARTHROSCOPY     RIGHT KNEE  . TONSILLECTOMY       Family History  Problem Relation Age of Onset  . Heart attack Father 69  . Hyperlipidemia Father   . Diabetes Father   . Hypertension Father   . Heart disease Father   . Fibromyalgia Sister   . Stroke Mother   . Arrhythmia Mother        atrial fib.  . Depression Mother   . Anxiety disorder  Mother   . Bipolar disorder Mother      Social History   Socioeconomic History  . Marital status: Married    Spouse name: Sean Winters  . Number of children: 2  . Years of education: Not on file  . Highest education level: Not on file  Occupational History  . Occupation: Secondary school teacher  Tobacco Use  . Smoking status: Former Smoker    Packs/day: 1.00    Years: 18.00    Pack years: 18.00    Types: Cigarettes    Quit date: 09/03/1987    Years since quitting: 32.9  . Smokeless tobacco: Never Used  Vaping Use  . Vaping Use: Never used  Substance and Sexual Activity  . Alcohol use: No  . Drug use: No  . Sexual activity: Not on file  Other Topics Concern  . Not on file  Social History Narrative  . Not on file   Social Determinants of Health   Financial Resource Strain:   . Difficulty of Paying Living Expenses: Not on file  Food Insecurity:   . Worried About Programme researcher, broadcasting/film/video in the Last Year: Not on file  . Ran Out of  Food in the Last Year: Not on file  Transportation Needs:   . Lack of Transportation (Medical): Not on file  . Lack of Transportation (Non-Medical): Not on file  Physical Activity:   . Days of Exercise per Week: Not on file  . Minutes of Exercise per Session: Not on file  Stress:   . Feeling of Stress : Not on file  Social Connections:   . Frequency of Communication with Friends and Family: Not on file  . Frequency of Social Gatherings with Friends and Family: Not on file  . Attends Religious Services: Not on file  . Active Member of Clubs or Organizations: Not on file  . Attends Banker Meetings: Not on file  . Marital Status: Not on file  Intimate Partner Violence:   . Fear of Current or Ex-Partner: Not on file  . Emotionally Abused: Not on file  . Physically Abused: Not on file  . Sexually Abused: Not on file     BP (!) 162/84   Pulse 96   Ht 5\' 6"  (1.676 m)   Wt 239 lb (108.4 kg)   SpO2 94%   BMI 38.58 kg/m   Physical Exam:  stable appearing 64 yo man, NAD HEENT: Unremarkable Neck:  6 cm JVD, no thyromegally Lymphatics:  No adenopathy Back:  No CVA tenderness Lungs:  Clear with no wheezes HEART:  Regular rate rhythm, no murmurs, no rubs, no clicks Abd:  soft, positive bowel sounds, no organomegally, no rebound, no guarding Ext:  2 plus pulses, no edema, no cyanosis, no clubbing Skin:  No rashes no nodules Neuro:  CN II through XII intact, motor grossly intact  EKG - NSR with first degree AV block  DEVICE  Normal device function.  See PaceArt for details.   Assess/Plan: 1. CHB - his PPM is working normally and he is asymptomatic, s/p PPM insertion. He will continue 2. 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 3. Obesity - he is encouraged to lose weight. 4. HTN - his bp is increased. I have recommended starting coreg 6.25 twice daily.  71 Taylor,MD

## 2020-08-08 NOTE — Patient Instructions (Signed)
Medication Instructions:  Your physician has recommended you make the following change in your medication:   1.  Start taking carvedilol 6.25 mg- Take one tablet by mouth twice a day   Labwork: None ordered.  Testing/Procedures: None ordered.  Follow-Up: Your physician wants you to follow-up in: one year with Dr. Ladona Ridgel.   You will receive a reminder letter in the mail two months in advance. If you don't receive a letter, please call our office to schedule the follow-up appointment.   Any Other Special Instructions Will Be Listed Below (If Applicable).  If you need a refill on your cardiac medications before your next appointment, please call your pharmacy.   Carvedilol Tablets What is this medicine? CARVEDILOL (KAR ve dil ol) is a beta blocker. It decreases the amount of work your heart has to do and helps your heart beat regularly. It treats high blood pressure. This medicine may be used for other purposes; ask your health care provider or pharmacist if you have questions. COMMON BRAND NAME(S): Coreg What should I tell my health care provider before I take this medicine? They need to know if you have any of these conditions:  circulation problems  diabetes  history of heart attack or heart disease  liver disease  lung or breathing disease, like asthma or emphysema  pheochromocytoma  slow or irregular heartbeat  thyroid disease  an unusual or allergic reaction to carvedilol, other beta-blockers, medicines, foods, dyes, or preservatives  pregnant or trying to get pregnant  breast-feeding How should I use this medicine? Take this drug by mouth. Take it as directed on the prescription label at the same time every day. Take it with food. Keep taking it unless your health care provider tells you to stop. Talk to your health care provider about the use of this drug in children. Special care may be needed. Overdosage: If you think you have taken too much of this medicine  contact a poison control center or emergency room at once. NOTE: This medicine is only for you. Do not share this medicine with others. What if I miss a dose? If you miss a dose, take it as soon as you can. If it is almost time for your next dose, take only that dose. Do not take double or extra doses. What may interact with this medicine? This medicine may interact with the following medications:  certain medicines for blood pressure, heart disease, irregular heart beat  certain medicines for depression, like fluoxetine or paroxetine  certain medicines for diabetes, like glipizide or glyburide  cimetidine  clonidine  cyclosporine  digoxin  MAOIs like Carbex, Eldepryl, Marplan, Nardil, and Parnate  reserpine  rifampin This list may not describe all possible interactions. Give your health care provider a list of all the medicines, herbs, non-prescription drugs, or dietary supplements you use. Also tell them if you smoke, drink alcohol, or use illegal drugs. Some items may interact with your medicine. What should I watch for while using this medicine? Check your heart rate and blood pressure regularly while you are taking this medicine. Ask your doctor or health care professional what your heart rate and blood pressure should be, and when you should contact him or her. Do not stop taking this medicine suddenly. This could lead to serious heart-related effects. Contact your doctor or health care professional if you have difficulty breathing while taking this drug. Check your weight daily. Ask your doctor or health care professional when you should notify him/her of any  weight gain. You may get drowsy or dizzy. Do not drive, use machinery, or do anything that requires mental alertness until you know how this medicine affects you. To reduce the risk of dizzy or fainting spells, do not sit or stand up quickly. Alcohol can make you more drowsy, and increase flushing and rapid heartbeats.  Avoid alcoholic drinks. This medicine may increase blood sugar. Ask your healthcare provider if changes in diet or medicines are needed if you have diabetes. If you are going to have surgery, tell your doctor or health care professional that you are taking this medicine. What side effects may I notice from receiving this medicine? Side effects that you should report to your doctor or health care professional as soon as possible:  allergic reactions like skin rash, itching or hives, swelling of the face, lips, or tongue  breathing problems  dark urine  irregular heartbeat   signs and symptoms of high blood sugar such as being more thirsty or hungry or having to urinate more than normal. You may also feel very tired or have blurry vision.  swollen legs or ankles  vomiting  yellowing of the eyes or skin Side effects that usually do not require medical attention (report to your doctor or health care professional if they continue or are bothersome):  change in sex drive or performance  diarrhea  dry eyes (especially if wearing contact lenses)  dry, itching skin  headache  nausea  unusually tired This list may not describe all possible side effects. Call your doctor for medical advice about side effects. You may report side effects to FDA at 1-800-FDA-1088. Where should I keep my medicine? Keep out of the reach of children and pets. Store at room temperature between 20 and 25 degrees C (68 and 77 degrees F). Protect from moisture. Keep the container tightly closed. Throw away any unused drug after the expiration date. NOTE: This sheet is a summary. It may not cover all possible information. If you have questions about this medicine, talk to your doctor, pharmacist, or health care provider.  2020 Elsevier/Gold Standard (2019-03-26 17:42:09)

## 2021-07-08 ENCOUNTER — Other Ambulatory Visit: Payer: Self-pay | Admitting: Internal Medicine

## 2021-10-10 ENCOUNTER — Other Ambulatory Visit: Payer: Self-pay | Admitting: Internal Medicine

## 2021-10-22 ENCOUNTER — Other Ambulatory Visit: Payer: Self-pay | Admitting: Internal Medicine

## 2022-04-10 ENCOUNTER — Encounter (INDEPENDENT_AMBULATORY_CARE_PROVIDER_SITE_OTHER): Payer: Self-pay

## 2022-08-28 ENCOUNTER — Encounter (HOSPITAL_BASED_OUTPATIENT_CLINIC_OR_DEPARTMENT_OTHER): Payer: Self-pay

## 2022-08-28 ENCOUNTER — Other Ambulatory Visit: Payer: Self-pay

## 2022-08-28 ENCOUNTER — Inpatient Hospital Stay (HOSPITAL_BASED_OUTPATIENT_CLINIC_OR_DEPARTMENT_OTHER)
Admission: EM | Admit: 2022-08-28 | Discharge: 2022-08-30 | DRG: 193 | Disposition: A | Discharge status: Home / Self Care | Payer: Medicare Other | Attending: Family Medicine | Admitting: Family Medicine

## 2022-08-28 ENCOUNTER — Encounter (HOSPITAL_COMMUNITY): Payer: Self-pay

## 2022-08-28 ENCOUNTER — Emergency Department (HOSPITAL_BASED_OUTPATIENT_CLINIC_OR_DEPARTMENT_OTHER): Payer: Medicare Other | Admitting: Radiology

## 2022-08-28 DIAGNOSIS — N400 Enlarged prostate without lower urinary tract symptoms: Secondary | ICD-10-CM | POA: Diagnosis present

## 2022-08-28 DIAGNOSIS — F319 Bipolar disorder, unspecified: Secondary | ICD-10-CM | POA: Diagnosis present

## 2022-08-28 DIAGNOSIS — Z7984 Long term (current) use of oral hypoglycemic drugs: Secondary | ICD-10-CM

## 2022-08-28 DIAGNOSIS — E669 Obesity, unspecified: Secondary | ICD-10-CM | POA: Diagnosis not present

## 2022-08-28 DIAGNOSIS — R7303 Prediabetes: Secondary | ICD-10-CM | POA: Diagnosis present

## 2022-08-28 DIAGNOSIS — I442 Atrioventricular block, complete: Secondary | ICD-10-CM | POA: Diagnosis not present

## 2022-08-28 DIAGNOSIS — Z87891 Personal history of nicotine dependence: Secondary | ICD-10-CM | POA: Diagnosis not present

## 2022-08-28 DIAGNOSIS — Z83438 Family history of other disorder of lipoprotein metabolism and other lipidemia: Secondary | ICD-10-CM

## 2022-08-28 DIAGNOSIS — F419 Anxiety disorder, unspecified: Secondary | ICD-10-CM | POA: Diagnosis present

## 2022-08-28 DIAGNOSIS — J9811 Atelectasis: Secondary | ICD-10-CM | POA: Diagnosis present

## 2022-08-28 DIAGNOSIS — Z7901 Long term (current) use of anticoagulants: Secondary | ICD-10-CM

## 2022-08-28 DIAGNOSIS — Z8249 Family history of ischemic heart disease and other diseases of the circulatory system: Secondary | ICD-10-CM | POA: Diagnosis not present

## 2022-08-28 DIAGNOSIS — J9601 Acute respiratory failure with hypoxia: Secondary | ICD-10-CM | POA: Diagnosis not present

## 2022-08-28 DIAGNOSIS — G2581 Restless legs syndrome: Secondary | ICD-10-CM | POA: Diagnosis not present

## 2022-08-28 DIAGNOSIS — Z950 Presence of cardiac pacemaker: Secondary | ICD-10-CM

## 2022-08-28 DIAGNOSIS — N179 Acute kidney failure, unspecified: Secondary | ICD-10-CM | POA: Diagnosis present

## 2022-08-28 DIAGNOSIS — Z818 Family history of other mental and behavioral disorders: Secondary | ICD-10-CM

## 2022-08-28 DIAGNOSIS — E782 Mixed hyperlipidemia: Secondary | ICD-10-CM | POA: Diagnosis not present

## 2022-08-28 DIAGNOSIS — Z833 Family history of diabetes mellitus: Secondary | ICD-10-CM | POA: Diagnosis not present

## 2022-08-28 DIAGNOSIS — Z86711 Personal history of pulmonary embolism: Secondary | ICD-10-CM

## 2022-08-28 DIAGNOSIS — I10 Essential (primary) hypertension: Secondary | ICD-10-CM | POA: Diagnosis present

## 2022-08-28 DIAGNOSIS — Z6839 Body mass index (BMI) 39.0-39.9, adult: Secondary | ICD-10-CM | POA: Diagnosis not present

## 2022-08-28 DIAGNOSIS — I110 Hypertensive heart disease with heart failure: Secondary | ICD-10-CM | POA: Diagnosis present

## 2022-08-28 DIAGNOSIS — Z91199 Patient's noncompliance with other medical treatment and regimen due to unspecified reason: Secondary | ICD-10-CM | POA: Diagnosis not present

## 2022-08-28 DIAGNOSIS — Z886 Allergy status to analgesic agent status: Secondary | ICD-10-CM

## 2022-08-28 DIAGNOSIS — I5032 Chronic diastolic (congestive) heart failure: Secondary | ICD-10-CM | POA: Diagnosis not present

## 2022-08-28 DIAGNOSIS — J101 Influenza due to other identified influenza virus with other respiratory manifestations: Principal | ICD-10-CM | POA: Diagnosis present

## 2022-08-28 DIAGNOSIS — Z79899 Other long term (current) drug therapy: Secondary | ICD-10-CM

## 2022-08-28 DIAGNOSIS — Z1152 Encounter for screening for COVID-19: Secondary | ICD-10-CM

## 2022-08-28 DIAGNOSIS — Z888 Allergy status to other drugs, medicaments and biological substances status: Secondary | ICD-10-CM

## 2022-08-28 DIAGNOSIS — Z823 Family history of stroke: Secondary | ICD-10-CM | POA: Diagnosis not present

## 2022-08-28 DIAGNOSIS — Z882 Allergy status to sulfonamides status: Secondary | ICD-10-CM

## 2022-08-28 LAB — RESP PANEL BY RT-PCR (RSV, FLU A&B, COVID)  RVPGX2
Influenza A by PCR: POSITIVE — AB
Influenza B by PCR: NEGATIVE
Resp Syncytial Virus by PCR: NEGATIVE
SARS Coronavirus 2 by RT PCR: NEGATIVE

## 2022-08-28 LAB — BRAIN NATRIURETIC PEPTIDE: B Natriuretic Peptide: 23 pg/mL (ref 0.0–100.0)

## 2022-08-28 LAB — CBC
HCT: 45.1 % (ref 39.0–52.0)
Hemoglobin: 14.5 g/dL (ref 13.0–17.0)
MCH: 28.8 pg (ref 26.0–34.0)
MCHC: 32.2 g/dL (ref 30.0–36.0)
MCV: 89.5 fL (ref 80.0–100.0)
Platelets: 257 10*3/uL (ref 150–400)
RBC: 5.04 MIL/uL (ref 4.22–5.81)
RDW: 14.8 % (ref 11.5–15.5)
WBC: 9.4 10*3/uL (ref 4.0–10.5)
nRBC: 0 % (ref 0.0–0.2)

## 2022-08-28 LAB — BASIC METABOLIC PANEL
Anion gap: 11 (ref 5–15)
BUN: 21 mg/dL (ref 8–23)
CO2: 25 mmol/L (ref 22–32)
Calcium: 9.2 mg/dL (ref 8.9–10.3)
Chloride: 102 mmol/L (ref 98–111)
Creatinine, Ser: 1.37 mg/dL — ABNORMAL HIGH (ref 0.61–1.24)
GFR, Estimated: 56 mL/min — ABNORMAL LOW (ref >60)
Glucose, Bld: 144 mg/dL — ABNORMAL HIGH (ref 70–99)
Potassium: 3.8 mmol/L (ref 3.5–5.1)
Sodium: 138 mmol/L (ref 135–145)

## 2022-08-28 LAB — TROPONIN I (HIGH SENSITIVITY)
Troponin I (High Sensitivity): 6 ng/L (ref <18)
Troponin I (High Sensitivity): 6 ng/L (ref <18)

## 2022-08-28 LAB — LITHIUM LEVEL: Lithium Lvl: 0.69 mmol/L (ref 0.60–1.20)

## 2022-08-28 MED ORDER — METHYLPREDNISOLONE SODIUM SUCC 125 MG IJ SOLR
125.0000 mg | Freq: Once | INTRAMUSCULAR | Status: AC
  Administered 2022-08-28: 125 mg via INTRAVENOUS
  Filled 2022-08-28: qty 2

## 2022-08-28 MED ORDER — LISINOPRIL 5 MG PO TABS
5.0000 mg | ORAL_TABLET | Freq: Every day | ORAL | Status: DC
  Administered 2022-08-29 – 2022-08-30 (×2): 5 mg via ORAL
  Filled 2022-08-28 (×2): qty 1

## 2022-08-28 MED ORDER — LITHIUM CARBONATE ER 450 MG PO TBCR
900.0000 mg | EXTENDED_RELEASE_TABLET | Freq: Every day | ORAL | Status: DC
  Administered 2022-08-28 – 2022-08-29 (×2): 900 mg via ORAL
  Filled 2022-08-28 (×3): qty 2

## 2022-08-28 MED ORDER — APIXABAN 5 MG PO TABS
5.0000 mg | ORAL_TABLET | Freq: Two times a day (BID) | ORAL | Status: DC
  Administered 2022-08-28 – 2022-08-30 (×4): 5 mg via ORAL
  Filled 2022-08-28 (×4): qty 1

## 2022-08-28 MED ORDER — OSELTAMIVIR PHOSPHATE 75 MG PO CAPS
75.0000 mg | ORAL_CAPSULE | Freq: Once | ORAL | Status: AC
  Administered 2022-08-28: 75 mg via ORAL
  Filled 2022-08-28: qty 1

## 2022-08-28 MED ORDER — IPRATROPIUM-ALBUTEROL 0.5-2.5 (3) MG/3ML IN SOLN
3.0000 mL | Freq: Once | RESPIRATORY_TRACT | Status: AC
  Administered 2022-08-28: 3 mL via RESPIRATORY_TRACT
  Filled 2022-08-28: qty 3

## 2022-08-28 MED ORDER — ACETAMINOPHEN 650 MG RE SUPP: 650.0000 mg | Freq: Four times a day (QID) | RECTAL | Status: DC | PRN

## 2022-08-28 MED ORDER — ONDANSETRON HCL 4 MG PO TABS: 4.0000 mg | ORAL_TABLET | Freq: Four times a day (QID) | ORAL | Status: DC | PRN

## 2022-08-28 MED ORDER — ONDANSETRON HCL 4 MG/2ML IJ SOLN: 4.0000 mg | Freq: Four times a day (QID) | INTRAMUSCULAR | Status: DC | PRN

## 2022-08-28 MED ORDER — ATORVASTATIN CALCIUM 10 MG PO TABS
20.0000 mg | ORAL_TABLET | Freq: Every day | ORAL | Status: DC
  Administered 2022-08-29 – 2022-08-30 (×2): 20 mg via ORAL
  Filled 2022-08-28 (×2): qty 2

## 2022-08-28 MED ORDER — LAMOTRIGINE 25 MG PO TABS
75.0000 mg | ORAL_TABLET | Freq: Every day | ORAL | Status: DC
  Administered 2022-08-29 – 2022-08-30 (×2): 75 mg via ORAL
  Filled 2022-08-28 (×2): qty 3

## 2022-08-28 MED ORDER — CARVEDILOL 6.25 MG PO TABS
6.2500 mg | ORAL_TABLET | Freq: Two times a day (BID) | ORAL | Status: DC
  Administered 2022-08-28 – 2022-08-29 (×3): 6.25 mg via ORAL
  Filled 2022-08-28 (×3): qty 1

## 2022-08-28 MED ORDER — PRAMIPEXOLE DIHYDROCHLORIDE 1 MG PO TABS
1.0000 mg | ORAL_TABLET | Freq: Every day | ORAL | Status: DC
  Administered 2022-08-28 – 2022-08-29 (×2): 1 mg via ORAL
  Filled 2022-08-28 (×3): qty 1

## 2022-08-28 MED ORDER — ACETAMINOPHEN 325 MG PO TABS: 650.0000 mg | ORAL_TABLET | Freq: Four times a day (QID) | ORAL | Status: DC | PRN

## 2022-08-28 NOTE — ED Triage Notes (Signed)
Patient here POV from Home.  Endorses SOB that began 2 Days ago. Recently discharged from Hospital 2 weeks ago for CHF. Mildly Productive Cough and Mild CP.   NAD Noted during Triage. A&Ox4. GCS 15. Ambulatory.

## 2022-08-28 NOTE — Progress Notes (Signed)
Plan of Care Note for accepted transfer   Patient: Sean Winters MRN: [id number]   DOA: 08/28/2022  Facility requesting transfer: [facility]   Requesting Provider: Dr. Particia Nearing, EDP Reason for transfer: Influenza A  Facility course: 66 year old male with PMH significant for chronic diastolic CHF, visiting from [place] who presents with complaints of worsening dyspnea, subjective fevers, and cough.  Noted to be hypoxic at home with O2 saturation in the 80's.  Initially thought it may have been related to his non compliance with his diuretics.  Presented at [facility] ED.  Tested positive for Influenza A.  Hypoxic in the ED with O2 saturation in the mid 80's.  CXR was non acute.  EDP requested admission due to acute hypoxia.  Admitted to [facility] telemetry medical as observation status.  Plan of care: The patient is accepted for admission to Telemetry unit, at [facility] as observation status.  Author: Darlin Drop, DO 08/28/2022  Check [email or web address] for on-call coverage.  Nursing staff, Please call [facility] Admits & Consults System-Wide number on Amion as soon as patient's arrival, so appropriate admitting provider can evaluate the pt.

## 2022-08-28 NOTE — ED Notes (Signed)
Attempted two straight sticks for blood work. Both unsuccessful. L.A. and R.A.

## 2022-08-28 NOTE — ED Notes (Signed)
Med list updated with meds pt brought in.

## 2022-08-28 NOTE — ED Notes (Addendum)
Care handoff has been given Government social research officer and to [employer]

## 2022-08-28 NOTE — ED Provider Notes (Signed)
[facility] EMERGENCY DEPT Provider Note   CSN: [id number] Arrival date & time: 08/28/22  1339     History  Chief Complaint  Patient presents with   Shortness of Breath    Sean Winters is a 66 y.o. male.  Pt is a 66 yo male with a pmhx signifcant for CHF, PE on Eliquis, diverticulitis, anxiety, depression, and hx complete heart block s/p Medtronic pacemaker.  Pt lives in [place] and is here visiting family.  He was recently admitted to a hospital in [place] for a CHF exacerbation and his meds were changed.  He has not been taking his bumex as directed because of the travel.  He developed sob and his family member (a ED PA) had him start taking his meds again as she was concerned he was developing extra fluid.  He took 1 extra pill yesterday.  He's now developed fever, cough, and myalgias.  There are also some other people who visited who are sick.  His O2 sat dropped to the upper 80s, so his family member recommended coming to the ED.  Pt has been compliant with his thinners.       Home Medications Prior to Admission medications   Medication Sig Start Date End Date Taking? Authorizing Provider  atorvastatin (LIPITOR) 20 MG tablet Take 1 tablet (20 mg total) by mouth daily. 04/10/20  Yes Whitmire, Dawn W, FNP  bumetanide (BUMEX) 1 MG tablet Take 1 mg by mouth 2 (two) times daily. 08/11/22  Yes [provider]  carvedilol (COREG) 6.25 MG tablet Take 1 tablet (6.25 mg total) by mouth 2 (two) times daily. Please make overdue appt with Dr. Ladona Ridgel before anymore refills. Thank you 2nd attempt 10/10/21  Yes Marinus Maw, MD  ELIQUIS 5 MG TABS tablet Take 5 mg by mouth 2 (two) times daily.   Yes [provider]  lamoTRIgine (LAMICTAL) 25 MG tablet Take 75 mg by mouth daily. 11/03/15  Yes [provider]  lisinopril (ZESTRIL) 5 MG tablet Take 5 mg by mouth daily. 08/11/22  Yes [provider]  lithium carbonate (LITHOBID) 300 MG CR tablet Take 900 mg  by mouth at bedtime. 11/15/15  Yes [provider]  ASHWAGANDHA PO Take 250 mg by mouth once.    [provider]  metFORMIN (GLUCOPHAGE) 500 MG tablet Take 1 tablet (500 mg total) by mouth 2 (two) times daily with a meal. 05/01/20   Whitmire, Dawn W, FNP  OVER THE COUNTER MEDICATION Take 3 capsules by mouth daily. CocoaVia- for memory and blood flow    [provider]  OVER THE COUNTER MEDICATION Take 2 tablets by mouth daily. For memory - Sensoril 250 mg tablets    [provider]  oxybutynin (DITROPAN-XL) 10 MG 24 hr tablet Take 10 mg by mouth daily. 11/07/16   [provider]  potassium chloride SA (KLOR-CON) 20 MEQ tablet Take 2 tablets (40 mEq total) by mouth daily. 09/09/19   Marinus Maw, MD  pramipexole (MIRAPEX) 1.5 MG tablet Take 1 mg by mouth at bedtime.     [provider]  Vitamin D, Ergocalciferol, (DRISDOL) 1.25 MG (50000 UT) CAPS capsule Take 1 capsule (50,000 Units total) by mouth every 7 (seven) days. 05/31/19   Quillian Quince D, MD      Allergies    Haloperidol, Ibuprofen, Sulfa antibiotics, and Sulfamethoxazole    Review of Systems   Review of Systems  Respiratory:  Positive for cough, shortness of breath and wheezing.  All other systems reviewed and are negative.   Physical Exam Updated Vital Signs BP (!) 110/54   Pulse 71   Temp 98.3 F (36.8 C)   Resp 18   Ht 5\' 6"  (1.676 m)   Wt 108.4 kg   SpO2 99%   BMI 38.57 kg/m  Physical Exam Vitals and nursing note reviewed.  Constitutional:      Appearance: He is well-developed.  HENT:     Head: Normocephalic and atraumatic.     Mouth/Throat:     Mouth: Mucous membranes are moist.     Pharynx: Oropharynx is clear.  Eyes:     Extraocular Movements: Extraocular movements intact.     Pupils: Pupils are equal, round, and reactive to light.  Cardiovascular:     Rate and Rhythm: Normal rate and regular rhythm.  Pulmonary:     Effort: Tachypnea present.      Breath sounds: Wheezing present.  Abdominal:     General: Bowel sounds are normal.     Palpations: Abdomen is soft.  Musculoskeletal:        General: Normal range of motion.     Cervical back: Normal range of motion and neck supple.     Right lower leg: Edema present.     Left lower leg: Edema present.  Skin:    General: Skin is warm.     Capillary Refill: Capillary refill takes less than 2 seconds.  Neurological:     General: No focal deficit present.     Mental Status: He is alert and oriented to person, place, and time.  Psychiatric:        Mood and Affect: Mood normal.        Behavior: Behavior normal.     ED Results / Procedures / Treatments   Labs (all labs ordered are listed, but only abnormal results are displayed) Labs Reviewed  RESP PANEL BY RT-PCR (RSV, FLU A&B, COVID)  RVPGX2 - Abnormal; Notable for the following components:      Result Value   Influenza A by PCR POSITIVE (*)    All other components within normal limits  BASIC METABOLIC PANEL - Abnormal; Notable for the following components:   Glucose, Bld 144 (*)    Creatinine, Ser 1.37 (*)    GFR, Estimated 56 (*)    All other components within normal limits  CBC  BRAIN NATRIURETIC PEPTIDE  URINALYSIS, ROUTINE W REFLEX MICROSCOPIC  LITHIUM LEVEL  TROPONIN I (HIGH SENSITIVITY)  TROPONIN I (HIGH SENSITIVITY)    EKG EKG Interpretation  Date/Time:  Wednesday August 28 2022 14:00:12 EST Ventricular Rate:  89 PR Interval:  256 QRS Duration: 82 QT Interval:  368 QTC Calculation: 447 R Axis:   68 Text Interpretation: Sinus rhythm with 1st degree A-V block Otherwise normal ECG When compared with ECG of 07-Sep-2019 14:23, No significant change was found Confirmed by 09-Sep-2019 [phone]) on 08/28/2022 2:06:17 PM  Radiology DG Chest 2 View  Result Date: 08/28/2022 CLINICAL DATA:  66 year old male with shortness of breath. EXAM: CHEST - 2 VIEW COMPARISON:  September 07, 2019 FINDINGS: Dual lead pacer  device, power pack over LEFT chest as before. Cardiomediastinal contours and hilar structures are stable. No consolidation. No sign of pleural effusion. Minimal linear airspace disease in the RIGHT mid chest. No pneumothorax. No gross effusion. On limited assessment no acute skeletal process. IMPRESSION: Minimal linear airspace disease in the RIGHT mid chest, likely atelectasis. No consolidation or effusion. Electronically Signed   By: September 09, 2019  Wile M.D.   On: 08/28/2022 14:18    Procedures Procedures    Medications Ordered in ED Medications  ipratropium-albuterol (DUONEB) 0.5-2.5 (3) MG/3ML nebulizer solution 3 mL (3 mLs Nebulization Given 08/28/22 1922)  methylPREDNISolone sodium succinate (SOLU-MEDROL) 125 mg/2 mL injection 125 mg (125 mg Intravenous Given 08/28/22 1936)  oseltamivir (TAMIFLU) capsule 75 mg (75 mg Oral Given 08/28/22 1931)    ED Course/ Medical Decision Making/ A&P                           Medical Decision Making Amount and/or Complexity of Data Reviewed Labs: ordered. Radiology: ordered.  Risk Prescription drug management. Decision regarding hospitalization.   This patient presents to the ED for concern of sob, this involves an extensive number of treatment options, and is a complaint that carries with it a high risk of complications and morbidity.  The differential diagnosis includes chf, pna, bronchitis, covid/flu/rsv   Co morbidities that complicate the patient evaluation  CHF, PE on Eliquis, diverticulitis, anxiety, depression, and hx complete heart block s/p Medtronic pacemaker   Additional history obtained:  Additional history obtained from epic chart review External records from outside source obtained and reviewed including family members   Lab Tests:  I Ordered, and personally interpreted labs.  The pertinent results include:  cbc nl, bmp nl other than mild elevation in cr 1.37; trop nl; Flu A +   Imaging Studies ordered:  I ordered  imaging studies including cxr  I independently visualized and interpreted imaging which showed  Minimal linear airspace disease in the RIGHT mid chest, likely  atelectasis. No consolidation or effusion.   I agree with the radiologist interpretation   Cardiac Monitoring:  The patient was maintained on a cardiac monitor.  I personally viewed and interpreted the cardiac monitored which showed an underlying rhythm of: nsr   Medicines ordered and prescription drug management:  I ordered medication including solumedrol/duoneb/tamiflu  for flu  Reevaluation of the patient after these medicines showed that the patient improved I have reviewed the patients home medicines and have made adjustments as needed   Test Considered:  Ct, but pt has been compliant with eliquis   Critical Interventions:  oxygen   Consultations Obtained:  I requested consultation with Dr. Margo Aye ([facility]),  and discussed lab and imaging findings as well as pertinent plan - she will admit   Problem List / ED Course:  Influenza A:  pt's O2 sat dropping to 84 when he talks.  Pt is placed on 2L and O2 sats are in the mid-90s.  Pt is given nebs/steroids and tamiflu   Reevaluation:  After the interventions noted above, I reevaluated the patient and found that they have :improved   Social Determinants of Health:  Lives at home; here visiting   Dispostion:  After consideration of the diagnostic results and the patients response to treatment, I feel that the patent would benefit from admission.          Final Clinical Impression(s) / ED Diagnoses Final diagnoses:  Influenza A  Acute respiratory failure with hypoxia [facility])    Rx / DC Orders ED Discharge Orders     None         Jacalyn Lefevre, MD 08/28/22 2016

## 2022-08-28 NOTE — ED Provider Triage Note (Signed)
Emergency Medicine Provider Triage Evaluation Note  Sean Winters , a 66 y.o. male  was evaluated in triage.  Pt complains of shortness of breath in the last 2 days, worst with ambulation. He reports some chest pain, cough and mild fever at home. No known sick contacts. Hx of HF, PE, has been compliant with his medications. )2 Sat 93% on RA in triage.  Review of Systems  Positive: As aboce Negative: As above  Physical Exam  BP 131/67 (BP Location: Right Arm)   Pulse 89   Temp 98.9 F (37.2 C) (Oral)   Resp (!) 22   Ht 5\' 6"  (1.676 m)   Wt 108.4 kg   SpO2 94%   BMI 38.57 kg/m  Gen:   Awake, no distress   Resp:  Normal effort, mild decreased breath sounds bilaterally. MSK:   Moves extremities without difficulty  Other:    Medical Decision Making  Medically screening exam initiated at 2:04 PM.  Appropriate orders placed.  was informed that the remainder of the evaluation will be completed by another provider, this initial triage assessment does not replace that evaluation, and the importance of remaining in the ED until their evaluation is complete.     [name], Jeanelle Malling 08/28/22 2335

## 2022-08-29 DIAGNOSIS — Z6839 Body mass index (BMI) 39.0-39.9, adult: Secondary | ICD-10-CM | POA: Diagnosis not present

## 2022-08-29 DIAGNOSIS — E669 Obesity, unspecified: Secondary | ICD-10-CM

## 2022-08-29 DIAGNOSIS — J9811 Atelectasis: Secondary | ICD-10-CM | POA: Diagnosis present

## 2022-08-29 DIAGNOSIS — G2581 Restless legs syndrome: Secondary | ICD-10-CM | POA: Diagnosis present

## 2022-08-29 DIAGNOSIS — E782 Mixed hyperlipidemia: Secondary | ICD-10-CM | POA: Diagnosis present

## 2022-08-29 DIAGNOSIS — N179 Acute kidney failure, unspecified: Secondary | ICD-10-CM | POA: Diagnosis present

## 2022-08-29 DIAGNOSIS — J9601 Acute respiratory failure with hypoxia: Secondary | ICD-10-CM | POA: Insufficient documentation

## 2022-08-29 DIAGNOSIS — Z8249 Family history of ischemic heart disease and other diseases of the circulatory system: Secondary | ICD-10-CM | POA: Diagnosis not present

## 2022-08-29 DIAGNOSIS — Z91199 Patient's noncompliance with other medical treatment and regimen due to unspecified reason: Secondary | ICD-10-CM | POA: Diagnosis not present

## 2022-08-29 DIAGNOSIS — Z833 Family history of diabetes mellitus: Secondary | ICD-10-CM | POA: Diagnosis not present

## 2022-08-29 DIAGNOSIS — Z823 Family history of stroke: Secondary | ICD-10-CM | POA: Diagnosis not present

## 2022-08-29 DIAGNOSIS — Z83438 Family history of other disorder of lipoprotein metabolism and other lipidemia: Secondary | ICD-10-CM | POA: Diagnosis not present

## 2022-08-29 DIAGNOSIS — I110 Hypertensive heart disease with heart failure: Secondary | ICD-10-CM | POA: Diagnosis present

## 2022-08-29 DIAGNOSIS — Z86711 Personal history of pulmonary embolism: Secondary | ICD-10-CM | POA: Diagnosis not present

## 2022-08-29 DIAGNOSIS — I5032 Chronic diastolic (congestive) heart failure: Secondary | ICD-10-CM | POA: Diagnosis present

## 2022-08-29 DIAGNOSIS — Z1152 Encounter for screening for COVID-19: Secondary | ICD-10-CM | POA: Diagnosis not present

## 2022-08-29 DIAGNOSIS — Z950 Presence of cardiac pacemaker: Secondary | ICD-10-CM | POA: Diagnosis not present

## 2022-08-29 DIAGNOSIS — R7303 Prediabetes: Secondary | ICD-10-CM

## 2022-08-29 DIAGNOSIS — Z87891 Personal history of nicotine dependence: Secondary | ICD-10-CM | POA: Diagnosis not present

## 2022-08-29 DIAGNOSIS — R739 Hyperglycemia, unspecified: Secondary | ICD-10-CM | POA: Insufficient documentation

## 2022-08-29 DIAGNOSIS — I442 Atrioventricular block, complete: Secondary | ICD-10-CM | POA: Diagnosis present

## 2022-08-29 DIAGNOSIS — F319 Bipolar disorder, unspecified: Secondary | ICD-10-CM | POA: Diagnosis present

## 2022-08-29 DIAGNOSIS — J101 Influenza due to other identified influenza virus with other respiratory manifestations: Secondary | ICD-10-CM | POA: Diagnosis present

## 2022-08-29 DIAGNOSIS — Z818 Family history of other mental and behavioral disorders: Secondary | ICD-10-CM | POA: Diagnosis not present

## 2022-08-29 DIAGNOSIS — N400 Enlarged prostate without lower urinary tract symptoms: Secondary | ICD-10-CM | POA: Diagnosis present

## 2022-08-29 DIAGNOSIS — I10 Essential (primary) hypertension: Secondary | ICD-10-CM

## 2022-08-29 DIAGNOSIS — I2699 Other pulmonary embolism without acute cor pulmonale: Secondary | ICD-10-CM

## 2022-08-29 LAB — COMPREHENSIVE METABOLIC PANEL
ALT: 25 U/L (ref 0–44)
AST: 26 U/L (ref 15–41)
Albumin: 3.5 g/dL (ref 3.5–5.0)
Alkaline Phosphatase: 78 U/L (ref 38–126)
Anion gap: 7 (ref 5–15)
BUN: 24 mg/dL — ABNORMAL HIGH (ref 8–23)
CO2: 26 mmol/L (ref 22–32)
Calcium: 8.9 mg/dL (ref 8.9–10.3)
Chloride: 105 mmol/L (ref 98–111)
Creatinine, Ser: 1.55 mg/dL — ABNORMAL HIGH (ref 0.61–1.24)
GFR, Estimated: 48 mL/min — ABNORMAL LOW (ref >60)
Glucose, Bld: 207 mg/dL — ABNORMAL HIGH (ref 70–99)
Potassium: 3.8 mmol/L (ref 3.5–5.1)
Sodium: 138 mmol/L (ref 135–145)
Total Bilirubin: 0.7 mg/dL (ref 0.3–1.2)
Total Protein: 6.5 g/dL (ref 6.5–8.1)

## 2022-08-29 LAB — CBC
HCT: 44.2 % (ref 39.0–52.0)
Hemoglobin: 14 g/dL (ref 13.0–17.0)
MCH: 28.9 pg (ref 26.0–34.0)
MCHC: 31.7 g/dL (ref 30.0–36.0)
MCV: 91.3 fL (ref 80.0–100.0)
Platelets: 229 10*3/uL (ref 150–400)
RBC: 4.84 MIL/uL (ref 4.22–5.81)
RDW: 15 % (ref 11.5–15.5)
WBC: 7.1 10*3/uL (ref 4.0–10.5)
nRBC: 0 % (ref 0.0–0.2)

## 2022-08-29 LAB — GLUCOSE, CAPILLARY
Glucose-Capillary: 193 mg/dL — ABNORMAL HIGH (ref 70–99)
Glucose-Capillary: 168 mg/dL — ABNORMAL HIGH (ref 70–99)
Glucose-Capillary: 192 mg/dL — ABNORMAL HIGH (ref 70–99)
Glucose-Capillary: 227 mg/dL — ABNORMAL HIGH (ref 70–99)
Glucose-Capillary: 163 mg/dL — ABNORMAL HIGH (ref 70–99)

## 2022-08-29 LAB — URINALYSIS, ROUTINE W REFLEX MICROSCOPIC
Bilirubin Urine: NEGATIVE
Glucose, UA: NEGATIVE mg/dL
Hgb urine dipstick: NEGATIVE
Ketones, ur: NEGATIVE mg/dL
Leukocytes,Ua: NEGATIVE
Nitrite: NEGATIVE
Protein, ur: NEGATIVE mg/dL
Specific Gravity, Urine: 1.016 (ref 1.005–1.030)
pH: 5 (ref 5.0–8.0)

## 2022-08-29 LAB — MAGNESIUM: Magnesium: 2.3 mg/dL (ref 1.7–2.4)

## 2022-08-29 LAB — CREATININE, SERUM
Creatinine, Ser: 1.47 mg/dL — ABNORMAL HIGH (ref 0.61–1.24)
GFR, Estimated: 52 mL/min — ABNORMAL LOW (ref >60)

## 2022-08-29 LAB — PHOSPHORUS: Phosphorus: 3 mg/dL (ref 2.5–4.6)

## 2022-08-29 LAB — HIV ANTIBODY (ROUTINE TESTING W REFLEX): HIV Screen 4th Generation wRfx: NONREACTIVE

## 2022-08-29 MED ORDER — METHYLPREDNISOLONE SODIUM SUCC 125 MG IJ SOLR
80.0000 mg | Freq: Every day | INTRAMUSCULAR | Status: DC
  Administered 2022-08-29 – 2022-08-30 (×2): 80 mg via INTRAVENOUS
  Filled 2022-08-29 (×2): qty 2

## 2022-08-29 MED ORDER — INSULIN ASPART 100 UNIT/ML IJ SOLN
0.0000 [IU] | Freq: Every day | INTRAMUSCULAR | Status: DC
  Administered 2022-08-29: 2 [IU] via SUBCUTANEOUS

## 2022-08-29 MED ORDER — METHYLPREDNISOLONE SODIUM SUCC 125 MG IJ SOLR: 80.0000 mg | Freq: Every day | INTRAMUSCULAR | Status: DC

## 2022-08-29 MED ORDER — DM-GUAIFENESIN ER 30-600 MG PO TB12
1.0000 | ORAL_TABLET | Freq: Two times a day (BID) | ORAL | Status: DC
  Administered 2022-08-29 – 2022-08-30 (×4): 1 via ORAL
  Filled 2022-08-29 (×4): qty 1

## 2022-08-29 MED ORDER — AZITHROMYCIN 500 MG PO TABS
250.0000 mg | ORAL_TABLET | Freq: Every day | ORAL | Status: DC
  Administered 2022-08-30: 250 mg via ORAL
  Filled 2022-08-29: qty 1

## 2022-08-29 MED ORDER — PANTOPRAZOLE SODIUM 40 MG PO TBEC
40.0000 mg | DELAYED_RELEASE_TABLET | Freq: Every day | ORAL | Status: DC
  Administered 2022-08-29 – 2022-08-30 (×2): 40 mg via ORAL
  Filled 2022-08-29 (×2): qty 1

## 2022-08-29 MED ORDER — AZITHROMYCIN 500 MG PO TABS
500.0000 mg | ORAL_TABLET | Freq: Every day | ORAL | Status: AC
  Administered 2022-08-29: 500 mg via ORAL
  Filled 2022-08-29: qty 1

## 2022-08-29 MED ORDER — OSELTAMIVIR PHOSPHATE 75 MG PO CAPS
75.0000 mg | ORAL_CAPSULE | Freq: Two times a day (BID) | ORAL | Status: DC
  Administered 2022-08-29 – 2022-08-30 (×3): 75 mg via ORAL
  Filled 2022-08-29 (×4): qty 1

## 2022-08-29 MED ORDER — INSULIN ASPART 100 UNIT/ML IJ SOLN
0.0000 [IU] | Freq: Three times a day (TID) | INTRAMUSCULAR | Status: DC
  Administered 2022-08-29: 3 [IU] via SUBCUTANEOUS
  Administered 2022-08-29 – 2022-08-30 (×3): 2 [IU] via SUBCUTANEOUS

## 2022-08-29 MED ORDER — IPRATROPIUM-ALBUTEROL 0.5-2.5 (3) MG/3ML IN SOLN: 3.0000 mL | RESPIRATORY_TRACT | Status: DC | PRN

## 2022-08-29 NOTE — Progress Notes (Signed)
   Patient seen and examined at bedside, patient admitted after midnight, please see earlier detailed admission note by Darlin Drop, DO. Briefly, patient presented secondary to shortness of breath and was found to have hypoxia and influenza infection. Tamiflu started.  Subjective: Patient reports feeling a bit better than from admission. Continues to have cough, sneezing, rhinorrhea, dyspnea at rest/with ambulation, mild chest pain.  BP (!) 117/53   Pulse 67   Temp 98.5 F (36.9 C) (Oral)   Resp 18   Ht 5\' 8"  (1.727 m)   Wt 118.4 kg   SpO2 100%   BMI 39.68 kg/m   General exam: Appears calm and comfortable Respiratory system: Clear to auscultation on left with right sided rales. Respiratory effort normal. Cardiovascular system: S1 & S2 heard, RRR. No murmurs, rubs, gallops or clicks. Gastrointestinal system: Abdomen is nondistended, soft and nontender. No organomegaly or masses felt. Normal bowel sounds heard. Central nervous system: Alert and oriented. No focal neurological deficits. Musculoskeletal: Trace bilateral lower extremity edema. No calf tenderness Skin: No cyanosis. No rashes. Psychiatry: Judgement and insight appear normal. Mood & affect appropriate.   Brief assessment/Plan:  Acute respiratory failure with hypoxia Associated dyspnea at rest and with ambulation. No oxygen use as an outpatient. Recent hospitalization for heart failure. BNP of 23 this admission. No pneumonia on chest x-ray, but with some atelectasis. -Wean to room air as able -Ambulatory pulse ox daily  Influenza A infection Noted on admission. Associated hypoxia with symptoms. Tamiflu started. -Continue Tamiflu x5 days (renally dose as needed)  AKI BUN/Creatinine of 18/1.21 from recent hospitalization. Creatinine of 1.37 on admission with worsening to 1.55. No IV fluids or diuretics given. -Check creatinine this AM -Hold Bumex -If creatinine still worsening, hold lisinopril -Will need to be  mindful of lithium dosing  Prediabetes Chronic diastolic heart failure Complete heart block s/p PPM Bipolar disorder Restless leg syndrome Mixed hyperlipidemia Primary hypertension Obesity -Per H&P. No changes  Family communication: None at bedside DVT prophylaxis: Eliquis Disposition: Discharge home in 1-2 days pending ability to wean oxygen to room air and improvement of functional capacity  08-16-1988, MD [facility] 08/29/2022, 7:49 AM

## 2022-08-29 NOTE — Care Management Obs Status (Signed)
MEDICARE OBSERVATION STATUS NOTIFICATION   Patient Details  Name: Sean Winters MRN: [id number] Date of Birth: 1956-05-03   Medicare Observation Status Notification Given:  Yes    Harriet Masson, RN 08/29/2022, 12:18 PM

## 2022-08-29 NOTE — Progress Notes (Signed)
  Transition of Care [facility]) Screening Note   Patient Details  Name: Sean Winters Date of Birth: 09/09/1955   Transition of Care [facility] CM/SW Contact:    Harriet Masson, RN Phone Number: 08/29/2022, 9:06 AM    Transition of Care Department [facility]) has reviewed patient and no TOC needs have been identified at this time. We will continue to monitor patient advancement through interdisciplinary progression rounds. If new patient transition needs arise, please place a TOC consult.

## 2022-08-29 NOTE — Progress Notes (Signed)
RN received report and assumed pt care with RN Toni Amend. Pt assessed. Resp even and unlabored currently on 3L O2 Sat 98%. SCDs applied and education provided. Needs addressed. Call bell within reach.   [id number] Provider at beside to assess pt. RN pending UA collection. NA notified of need for urine. No other needs to address at this moment.

## 2022-08-29 NOTE — H&P (Addendum)
History and Physical    Patient: Sean Winters GTX:[id number] DOB: 1955-12-01 DOA: 08/28/2022 DOS: the patient was seen and examined on 08/29/2022 PCP: Barbie Banner, MD  Patient coming from: Home  Chief Complaint:  Chief Complaint  Patient presents with   Shortness of Breath   HPI: Sean Winters is a 66 y.o. male with medical history significant of chronic diastolic CHF, PE on Eliquis, anxiety, depression, history of complete heart block status post Medtronic pacemaker who presents with complaint of subjective fever, cough and worsening shortness of breath, he was visiting from [place].  He was recently admitted to the hospital in [place] for a CHF exacerbation and his meds were changed, he has not been compliant with his Bumex regimen due to traveling.  Patient developed shortness of breath and a family member (an ED PE) told him to start taking his medication again since this may be due to extra fluid buildup, so patient took an extra pill yesterday.  Patient now complain of fever, cough, body aches.  He has had some sick contacts.  His oxygen was noted to drop to upper 80s, so his family member recommended that he should go to the ED for further evaluation and management.  Patient has been compliant with his blood thinner.   ED Course:  In the emergency department, BP was 110/54, O2 sat was 94% on room air, but this drops to 84% when he talks per ED physician's medical record, supplemental oxygen via [place] at 2 LPM was provided with improved oxygen up to mid 90s, other vital signs were within normal range.  Workup in the ED showed normal CBC and normal BMP except for blood glucose of 144 and creatinine of 1.37.  Troponin x 2 was flat at 6, BNP 23, lithium 0.6 , Magnesium 2.3, phosphorus 3.0.  Influenza A was positive.  Influenza B, SARS coronavirus 2 and RSV was negative. Chest x-ray showed minimal linear airspace disease in the right mid chest, likely atelectasis.  No consolidation or  effusion Patient was provided with pretreatment with DuoNeb, Solu-Medrol 125 mg tabs was given and patient was started on Tamiflu.  He was then transferred to [facility] for further evaluation and management.  Review of Systems: Review of systems as noted in the HPI. All other systems reviewed and are negative.   Past Medical History:  Diagnosis Date   Anxiety    Bipolar disorder [facility])    BPH (benign prostatic hypertrophy)    Chest pain    Complete heart block (HCC)    a. s/p Medtronic PPM 2017   Cystitis, acute    Depression    Diverticulitis    Dyslipidemia    Dyspnea    Edema    Lumbago    Nephrolithiasis    Pulmonary embolism (HCC) 10/2009   Past Surgical History:  Procedure Laterality Date   EP IMPLANTABLE DEVICE N/A 12/04/2015   Procedure: Pacemaker Implant;  Surgeon: Marinus Maw, MD;  Location: [facility] INVASIVE CV LAB;  Service: Cardiovascular;  Laterality: N/A;   FINGER FRACTURE SURGERY  2004   KIDNEY STONES     REMOVAL    KNEE ARTHROSCOPY     RIGHT KNEE   TONSILLECTOMY      Social History:  reports that he quit smoking about 35 years ago. His smoking use included cigarettes. He has a 18.00 pack-year smoking history. He has never used smokeless tobacco. He reports current alcohol use. He reports that he does not use drugs.  Allergies  Allergen Reactions   Haloperidol Other (See Comments)    Fatigue   Ibuprofen Other (See Comments)    Cannot take while on lithium   Sulfa Antibiotics Rash   Sulfamethoxazole Rash    Family History  Problem Relation Age of Onset   Heart attack Father 650   Hyperlipidemia Father    Diabetes Father    Hypertension Father    Heart disease Father    Fibromyalgia Sister    Stroke Mother    Arrhythmia Mother        atrial fib.   Depression Mother    Anxiety disorder Mother    Bipolar disorder Mother      Prior to Admission medications   Medication Sig Start Date End Date Taking? Authorizing Provider  atorvastatin (LIPITOR)  20 MG tablet Take 1 tablet (20 mg total) by mouth daily. 04/10/20  Yes Whitmire, Dawn W, FNP  bumetanide (BUMEX) 1 MG tablet Take 1 mg by mouth 2 (two) times daily. 08/11/22  Yes [provider]  carvedilol (COREG) 6.25 MG tablet Take 1 tablet (6.25 mg total) by mouth 2 (two) times daily. Please make overdue appt with Dr. Ladona Ridgelaylor before anymore refills. Thank you 2nd attempt 10/10/21  Yes Marinus Mawaylor, Gregg W, MD  ELIQUIS 5 MG TABS tablet Take 5 mg by mouth 2 (two) times daily.   Yes [provider]  lamoTRIgine (LAMICTAL) 25 MG tablet Take 75 mg by mouth daily. 11/03/15  Yes [provider]  lisinopril (ZESTRIL) 5 MG tablet Take 5 mg by mouth daily. 08/11/22  Yes [provider]  lithium carbonate (LITHOBID) 300 MG CR tablet Take 900 mg by mouth at bedtime. 11/15/15  Yes [provider]  ASHWAGANDHA PO Take 250 mg by mouth once.    [provider]  metFORMIN (GLUCOPHAGE) 500 MG tablet Take 1 tablet (500 mg total) by mouth 2 (two) times daily with a meal. 05/01/20   Whitmire, Dawn W, FNP  OVER THE COUNTER MEDICATION Take 3 capsules by mouth daily. CocoaVia- for memory and blood flow    [provider]  OVER THE COUNTER MEDICATION Take 2 tablets by mouth daily. For memory - Sensoril 250 mg tablets    [provider]  oxybutynin (DITROPAN-XL) 10 MG 24 hr tablet Take 10 mg by mouth daily. 11/07/16   [provider]  potassium chloride SA (KLOR-CON) 20 MEQ tablet Take 2 tablets (40 mEq total) by mouth daily. 09/09/19   Marinus Mawaylor, Gregg W, MD  pramipexole (MIRAPEX) 1.5 MG tablet Take 1 mg by mouth at bedtime.     [provider]  Vitamin D, Ergocalciferol, (DRISDOL) 1.25 MG (50000 UT) CAPS capsule Take 1 capsule (50,000 Units total) by mouth every 7 (seven) days. 05/31/19   Wilder GladeBeasley, Caren D, MD    Physical Exam: BP 122/61 (BP Location: Right Arm)   Pulse 85   Temp 98.7 F (37.1 C) (Oral)   Resp 20   Ht 5\' 8"  (1.727 m)   Wt 118.4  kg   SpO2 93%   BMI 39.68 kg/m   General: 66 y.o. year-old male well developed well nourished in no acute distress.  Alert and oriented x3. HEENT: NCAT, EOMI Neck: Supple, trachea medial Cardiovascular: Regular rate and rhythm with no rubs or gallops.  No thyromegaly or JVD noted.  Bilateral lower extremity edema. 2/4 pulses in all 4 extremities. Respiratory: Tachypnea.  Diffuse wheezing on auscultation.   Abdomen: Soft, nontender nondistended with normal bowel sounds x4  quadrants. Muskuloskeletal: No cyanosis or clubbing  noted bilaterally Neuro: CN II-XII intact, strength 5/5 x 4, sensation, reflexes intact Skin: No ulcerative lesions noted or rashes Psychiatry: Judgement and insight appear normal. Mood is appropriate for condition and setting          Labs on Admission:  Basic Metabolic Panel: Recent Labs  Lab 08/28/22 1410 08/28/22 2358  NA 138 138  K 3.8 3.8  CL 102 105  CO2 25 26  GLUCOSE 144* 207*  BUN 21 24*  CREATININE 1.37* 1.55*  CALCIUM 9.2 8.9  MG  --  2.3  PHOS  --  3.0   Liver Function Tests: Recent Labs  Lab 08/28/22 2358  AST 26  ALT 25  ALKPHOS 78  BILITOT 0.7  PROT 6.5  ALBUMIN 3.5   No results for input(s): "LIPASE", "AMYLASE" in the last 168 hours. No results for input(s): "AMMONIA" in the last 168 hours. CBC: Recent Labs  Lab 08/28/22 1410 08/28/22 2358  WBC 9.4 7.1  HGB 14.5 14.0  HCT 45.1 44.2  MCV 89.5 91.3  PLT 257 229   Cardiac Enzymes: No results for input(s): "CKTOTAL", "CKMB", "CKMBINDEX", "TROPONINI" in the last 168 hours.  BNP (last 3 results) Recent Labs    08/28/22 1410  BNP 23.0    ProBNP (last 3 results) No results for input(s): "PROBNP" in the last 8760 hours.  CBG: No results for input(s): "GLUCAP" in the last 168 hours.  Radiological Exams on Admission: DG Chest 2 View  Result Date: 08/28/2022 CLINICAL DATA:  66 year old male with shortness of breath. EXAM: CHEST - 2 VIEW COMPARISON:  September 07, 2019  FINDINGS: Dual lead pacer device, power pack over LEFT chest as before. Cardiomediastinal contours and hilar structures are stable. No consolidation. No sign of pleural effusion. Minimal linear airspace disease in the RIGHT mid chest. No pneumothorax. No gross effusion. On limited assessment no acute skeletal process. IMPRESSION: Minimal linear airspace disease in the RIGHT mid chest, likely atelectasis. No consolidation or effusion. Electronically Signed   By: Donzetta Kohut M.D.   On: 08/28/2022 14:18    EKG: I independently viewed the EKG done and my findings are as followed:    Assessment/Plan Present on Admission:  Influenza A  Essential hypertension  Mixed hyperlipidemia  Bipolar disorder, rapid cycling (HCC)  Prediabetes  Principal Problem:   Influenza A Active Problems:   Mixed hyperlipidemia   Prediabetes   Bipolar disorder, rapid cycling (HCC)   Essential hypertension   Acute respiratory failure with hypoxia (HCC)   AKI (acute kidney injury) (HCC)   Chronic diastolic CHF (congestive heart failure) (HCC)  Acute respiratory failure with hypoxia in the setting of influenza A Continue Tamiflu Continue duo nebs, Mucinex, Solu-Medrol, azithromycin. Continue Protonix to prevent steroid-induced ulcer Continue incentive spirometry and flutter valve Continue supplemental oxygen to maintain O2 sat > 92% with plan to wean patient off oxygen as tolerated  Acute kidney injury Creatinine of 1.37 Renally adjust medications, avoid nephrotoxic agents/dehydration/hypotension  Prediabetes Hemoglobin A1c on 04/10/2020 was 5.7, this will be rechecked Blood glucose at 144 Continue ISS and hypoglycemia protocol  Chronic diastolic CHF Continue Coreg, Lipitor, lisinopril  Complete heart block Patient has a pacemaker  Pulmonary embolism Continue Eliquis  Bipolar disorder Continue lithium, Lamictal  RLS Continue Mirapex  Mixed hyperlipidemia Continue Lipitor  Essential  hypertension Continue lisinopril, Coreg  Obesity (BMI 39.68) Diet and lifestyle modification  DVT prophylaxis: Eliquis  Code Status: Full code  Family Communication: None at  bedside  Consults: None  Severity of Illness: The appropriate patient status for this patient is OBSERVATION. Observation status is judged to be reasonable and necessary in order to provide the required intensity of service to ensure the patient's safety. The patient's presenting symptoms, physical exam findings, and initial radiographic and laboratory data in the context of their medical condition is felt to place them at decreased risk for further clinical deterioration. Furthermore, it is anticipated that the patient will be medically stable for discharge from the hospital within 2 midnights of admission.   Author: Frankey Shown, DO 08/29/2022 4:06 AM  For on call review [email or web address].

## 2022-08-29 NOTE — Plan of Care (Signed)

## 2022-08-30 ENCOUNTER — Other Ambulatory Visit (HOSPITAL_COMMUNITY): Payer: Self-pay

## 2022-08-30 DIAGNOSIS — J101 Influenza due to other identified influenza virus with other respiratory manifestations: Secondary | ICD-10-CM | POA: Diagnosis not present

## 2022-08-30 LAB — HEMOGLOBIN A1C
Hgb A1c MFr Bld: 6.9 % — ABNORMAL HIGH (ref 4.8–5.6)
Mean Plasma Glucose: 151 mg/dL

## 2022-08-30 LAB — GLUCOSE, CAPILLARY
Glucose-Capillary: 140 mg/dL — ABNORMAL HIGH (ref 70–99)
Glucose-Capillary: 162 mg/dL — ABNORMAL HIGH (ref 70–99)
Glucose-Capillary: 116 mg/dL — ABNORMAL HIGH (ref 70–99)
Glucose-Capillary: 96 mg/dL (ref 70–99)

## 2022-08-30 LAB — BASIC METABOLIC PANEL
Anion gap: 7 (ref 5–15)
BUN: 29 mg/dL — ABNORMAL HIGH (ref 8–23)
CO2: 25 mmol/L (ref 22–32)
Calcium: 8.9 mg/dL (ref 8.9–10.3)
Chloride: 106 mmol/L (ref 98–111)
Creatinine, Ser: 1.35 mg/dL — ABNORMAL HIGH (ref 0.61–1.24)
GFR, Estimated: 57 mL/min — ABNORMAL LOW (ref >60)
Glucose, Bld: 156 mg/dL — ABNORMAL HIGH (ref 70–99)
Potassium: 4.1 mmol/L (ref 3.5–5.1)
Sodium: 138 mmol/L (ref 135–145)

## 2022-08-30 MED ORDER — ALBUTEROL SULFATE HFA 108 (90 BASE) MCG/ACT IN AERS
2.0000 | INHALATION_SPRAY | Freq: Four times a day (QID) | RESPIRATORY_TRACT | 2 refills | Status: AC | PRN
  Filled 2022-08-30: qty 18, 25d supply, fill #0

## 2022-08-30 MED ORDER — OSELTAMIVIR PHOSPHATE 75 MG PO CAPS
75.0000 mg | ORAL_CAPSULE | Freq: Two times a day (BID) | ORAL | 0 refills | Status: AC
  Filled 2022-08-30: qty 7, 4d supply, fill #0

## 2022-08-30 MED ORDER — PREDNISONE 20 MG PO TABS
40.0000 mg | ORAL_TABLET | Freq: Every day | ORAL | 0 refills | Status: AC
  Filled 2022-08-30: qty 6, 3d supply, fill #0

## 2022-08-30 NOTE — Hospital Course (Signed)
Sean Winters is a 66 y.o. male with a history of diastolic heart failure, PE on Eliquis, Anxiety, depression, complete heart block s/p PPM. Patient presented secondary to fever and shortness of breath and was found to have evidence of influenza infection with associated hypoxia. Patient managed on Tamiflu, solu-medrol and azithromycin with improvement of symptoms.

## 2022-08-30 NOTE — TOC CM/SW Note (Addendum)
Spoke to patient on phone .   He is active  with [facility] home [facility] in [place] [phone]  for nursing and PT, he would like to continue services with them.   NCM spoke to Sue Lush with [facility] , she will need orders for [facility] and PT and discharge summary faxed to her at [phone]   NCM secure chatted MD to sign orders and face to face. Once signed will fax .   Fax sent

## 2022-08-30 NOTE — Discharge Instructions (Addendum)
Sean Winters,  You were in the hospital with influenza and low oxygen levels. You have improved with treatment with steroids, antibiotics and Tamiflu. Please continue treatment on discharge. Thankfully, you are not requiring oxygen at time of discharge.

## 2022-08-30 NOTE — Progress Notes (Signed)
DISCHARGE NOTE HOME TERI DILTZ to be discharged Home per MD order. Discussed prescriptions and follow up appointments with the patient. Prescriptions given to patient; medication list explained in detail. Patient verbalized understanding.  TOC and Home meds given to patient before discharge.  Skin clean, dry and intact without evidence of skin break down, no evidence of skin tears noted. IV catheter discontinued intact. Site without signs and symptoms of complications. Dressing and pressure applied. Pt denies pain at the site currently. No complaints noted.  Patient free of lines, drains, and wounds.   An After Visit Summary (AVS) was printed and given to the patient. Patient escorted via wheelchair, and discharged home via private auto.  Oda Cogan, LPN

## 2022-08-30 NOTE — Discharge Summary (Signed)
Physician Discharge Summary   Patient: Sean Winters MRN: [id number] DOB: [birth year]  Admit date:     08/28/2022  Discharge date: 08/30/22  Discharge Physician: Jacquelin Hawking, MD   PCP: Patient, No Pcp Per   Recommendations at discharge:  PCP follow-up  Discharge Diagnoses: Principal Problem:   Influenza A Active Problems:   Mixed hyperlipidemia   Prediabetes   Bipolar disorder, rapid cycling (HCC)   Essential hypertension   Acute respiratory failure with hypoxia (HCC)   AKI (acute kidney injury) (HCC)   Chronic diastolic CHF (congestive heart failure) (HCC)  Resolved Problems:   * No resolved hospital problems. *  Hospital Course: Sean Winters is a 66 y.o. male with a history of diastolic heart failure, PE on Eliquis, Anxiety, depression, complete heart block s/p PPM. Patient presented secondary to fever and shortness of breath and was found to have evidence of influenza infection with associated hypoxia. Patient managed on Tamiflu, solu-medrol and azithromycin with improvement of symptoms.  Assessment and Plan: Acute respiratory failure with hypoxia Associated dyspnea at rest and with ambulation. No oxygen use as an outpatient. Recent hospitalization for heart failure. BNP of 23 this admission. No pneumonia on chest x-ray, but with some atelectasis. Patient managed with azithromycin and steroids with improvement of hypoxia. Patient not qualifying for home oxygen prior to discharge. Discharge with prednisone, azithromycin and albuterol inhaler.   Influenza A infection Noted on admission. Associated hypoxia with symptoms. Tamiflu started. Continue Tamiflu on discharge.   AKI BUN/Creatinine of 18/1.21 from recent hospitalization. Creatinine of 1.37 on admission with worsening to 1.55. No IV fluids or diuretics given on admission. Creatinine improved to 1.35 prior to discharge.   Prediabetes Chronic diastolic heart failure Complete heart block s/p PPM Bipolar  disorder Restless leg syndrome Mixed hyperlipidemia Primary hypertension No changes to home regimen.  Obesity Estimated body mass index is 39.68 kg/m as calculated from the following:   Height as of this encounter: 5\' 8"  (1.727 m).   Weight as of this encounter: 118.4 kg.  Consultants: None Procedures performed: None  Disposition: Home Diet recommendation: Cardiac diet  DISCHARGE MEDICATION: Allergies as of 08/30/2022       Reactions   Haloperidol Other (See Comments)   Fatigue   Ibuprofen Other (See Comments)   Cannot take while on lithium   Sulfa Antibiotics Rash   Sulfamethoxazole Rash        Medication List     TAKE these medications    albuterol 108 (90 Base) MCG/ACT inhaler Commonly known as: VENTOLIN HFA Inhale 2 puffs into the lungs every 6 (six) hours as needed for wheezing or shortness of breath.   atorvastatin 20 MG tablet Commonly known as: LIPITOR Take 1 tablet (20 mg total) by mouth daily.   bumetanide 1 MG tablet Commonly known as: BUMEX Take 1 mg by mouth 2 (two) times daily.   carvedilol 6.25 MG tablet Commonly known as: COREG Take 1 tablet (6.25 mg total) by mouth 2 (two) times daily. Please make overdue appt with Dr. 09/01/2022 before anymore refills. Thank you 2nd attempt   Eliquis 5 MG Tabs tablet Generic drug: apixaban Take 5 mg by mouth 2 (two) times daily.   lamoTRIgine 25 MG tablet Commonly known as: LAMICTAL Take 75 mg by mouth daily.   lisinopril 5 MG tablet Commonly known as: ZESTRIL Take 5 mg by mouth daily.   lithium carbonate 300 MG ER tablet Commonly known as: LITHOBID Take 900 mg by mouth at bedtime.  oseltamivir 75 MG capsule Commonly known as: TAMIFLU Take 1 capsule (75 mg total) by mouth 2 (two) times daily for 4 days.   pramipexole 1.5 MG tablet Commonly known as: MIRAPEX Take 1 mg by mouth at bedtime.   predniSONE 20 MG tablet Commonly known as: DELTASONE Take 2 tablets (40 mg total) by mouth daily with  breakfast for 3 days. Start taking on: August 31, 2022        Discharge Exam: BP (!) 114/57 (BP Location: Left Arm)   Pulse 77   Temp 97.6 F (36.4 C) (Oral)   Resp (!) 23   Ht 5\' 8"  (1.727 m)   Wt 118.4 kg   SpO2 93%   BMI 39.68 kg/m   General exam: Appears calm and comfortable Respiratory system: Clear to auscultation. Respiratory effort normal. Cardiovascular system: S1 & S2 heard, RRR. Gastrointestinal system: Abdomen is nondistended, soft and nontender. Normal bowel sounds heard. Central nervous system: Alert and oriented. No focal neurological deficits. Musculoskeletal: No calf tenderness Skin: No cyanosis. No rashes Psychiatry: Judgement and insight appear normal. Mood & affect appropriate.   Condition at discharge: stable  The results of significant diagnostics from this hospitalization (including imaging, microbiology, ancillary and laboratory) are listed below for reference.   Imaging Studies: DG Chest 2 View  Result Date: 08/28/2022 CLINICAL DATA:  66 year old male with shortness of breath. EXAM: CHEST - 2 VIEW COMPARISON:  September 07, 2019 FINDINGS: Dual lead pacer device, power pack over LEFT chest as before. Cardiomediastinal contours and hilar structures are stable. No consolidation. No sign of pleural effusion. Minimal linear airspace disease in the RIGHT mid chest. No pneumothorax. No gross effusion. On limited assessment no acute skeletal process. IMPRESSION: Minimal linear airspace disease in the RIGHT mid chest, likely atelectasis. No consolidation or effusion. Electronically Signed   By: September 09, 2019 M.D.   On: 08/28/2022 14:18    Microbiology: Results for orders placed or performed during the hospital encounter of 08/28/22  Resp panel by RT-PCR (RSV, Flu A&B, Covid) Anterior Nasal Swab     Status: Abnormal   Collection Time: 08/28/22  2:10 PM   Specimen: Anterior Nasal Swab  Result Value Ref Range Status   SARS Coronavirus 2 by RT PCR NEGATIVE  NEGATIVE Final    Comment: (NOTE) SARS-CoV-2 target nucleic acids are NOT DETECTED.  The SARS-CoV-2 RNA is generally detectable in upper respiratory specimens during the acute phase of infection. The lowest concentration of SARS-CoV-2 viral copies this assay can detect is 138 copies/mL. A negative result does not preclude SARS-Cov-2 infection and should not be used as the sole basis for treatment or other patient management decisions. A negative result may occur with  improper specimen collection/handling, submission of specimen other than nasopharyngeal swab, presence of viral mutation(s) within the areas targeted by this assay, and inadequate number of viral copies(<138 copies/mL). A negative result must be combined with clinical observations, patient history, and epidemiological information. The expected result is Negative.  Fact Sheet for Patients:  08/30/22  Fact Sheet for Healthcare Providers:  BloggerCourse.com  This test is no t yet approved or cleared by the [place] FDA and  has been authorized for detection and/or diagnosis of SARS-CoV-2 by FDA under an Emergency Use Authorization (EUA). This EUA will remain  in effect (meaning this test can be used) for the duration of the COVID-19 declaration under Section 564(b)(1) of the Act, 21 U.S.C.section 360bbb-3(b)(1), unless the authorization is terminated  or revoked sooner.  Influenza A by PCR POSITIVE (A) NEGATIVE Final   Influenza B by PCR NEGATIVE NEGATIVE Final    Comment: (NOTE) The Xpert Xpress SARS-CoV-2/FLU/RSV plus assay is intended as an aid in the diagnosis of influenza from Nasopharyngeal swab specimens and should not be used as a sole basis for treatment. Nasal washings and aspirates are unacceptable for Xpert Xpress SARS-CoV-2/FLU/RSV testing.  Fact Sheet for Patients: BloggerCourse.com  Fact Sheet for  Healthcare Providers: SeriousBroker.it  This test is not yet approved or cleared by the [place] FDA and has been authorized for detection and/or diagnosis of SARS-CoV-2 by FDA under an Emergency Use Authorization (EUA). This EUA will remain in effect (meaning this test can be used) for the duration of the COVID-19 declaration under Section 564(b)(1) of the Act, 21 U.S.C. section 360bbb-3(b)(1), unless the authorization is terminated or revoked.     Resp Syncytial Virus by PCR NEGATIVE NEGATIVE Final    Comment: (NOTE) Fact Sheet for Patients: BloggerCourse.com  Fact Sheet for Healthcare Providers: [employer]ious[employer]ker.it  This test is not yet approved or cleared by the [place] FDA and has been authorized for detection and/or diagnosis of SARS-CoV-2 by FDA under an Emergency Use Authorization (EUA). This EUA will remain in effect (meaning this test can be used) for the duration of the COVID-19 declaration under Section 564(b)(1) of the Act, 21 U.S.C. section 360bbb-3(b)(1), unless the authorization is terminated or revoked.  Performed at [facility], [address], [place], [place] [address]     Labs: CBC: Recent Labs  Lab 08/28/22 1410 08/28/22 2358  WBC 9.4 7.1  HGB 14.5 14.0  HCT 45.1 44.2  MCV 89.5 91.3  PLT 257 229   Basic Metabolic Panel: Recent Labs  Lab 08/28/22 1410 08/28/22 2358 08/29/22 1033 08/30/22 0137  NA 138 138  --  138  K 3.8 3.8  --  4.1  CL 102 105  --  106  CO2 25 26  --  25  GLUCOSE 144* 207*  --  156*  BUN 21 24*  --  29*  CREATININE 1.37* 1.55* 1.47* 1.35*  CALCIUM 9.2 8.9  --  8.9  MG  --  2.3  --   --   PHOS  --  3.0  --   --    Liver Function Tests: Recent Labs  Lab 08/28/22 2358  AST 26  ALT 25  ALKPHOS 78  BILITOT 0.7  PROT 6.5  ALBUMIN 3.5   CBG: Recent Labs  Lab 08/29/22 2042 08/30/22 0734 08/30/22 0811  08/30/22 1143 08/30/22 1206  GLUCAP 163* 140* 162* 96 116*    Discharge time spent: 35 minutes.  Signed: Jacquelin Hawking, MD [facility] 08/30/2022
